# Patient Record
Sex: Female | Born: 1950 | Race: White | Hispanic: No | Marital: Married | Smoking: Former smoker
Health system: Southern US, Community
[De-identification: ages and names within clinical notes are randomized; demographics above are authoritative.]

## PROBLEM LIST (undated history)

## (undated) DIAGNOSIS — R0600 Dyspnea, unspecified: Secondary | ICD-10-CM

## (undated) DIAGNOSIS — I442 Atrioventricular block, complete: Secondary | ICD-10-CM

## (undated) DIAGNOSIS — J449 Chronic obstructive pulmonary disease, unspecified: Secondary | ICD-10-CM

## (undated) DIAGNOSIS — D500 Iron deficiency anemia secondary to blood loss (chronic): Secondary | ICD-10-CM

## (undated) DIAGNOSIS — R001 Bradycardia, unspecified: Secondary | ICD-10-CM

## (undated) DIAGNOSIS — M1990 Unspecified osteoarthritis, unspecified site: Secondary | ICD-10-CM

## (undated) DIAGNOSIS — I10 Essential (primary) hypertension: Secondary | ICD-10-CM

## (undated) DIAGNOSIS — E785 Hyperlipidemia, unspecified: Secondary | ICD-10-CM

## (undated) DIAGNOSIS — E119 Type 2 diabetes mellitus without complications: Secondary | ICD-10-CM

## (undated) HISTORY — PX: INSERT / REPLACE / REMOVE PACEMAKER: SUR710

## (undated) HISTORY — PX: CHOLECYSTECTOMY: SHX55

## (undated) HISTORY — PX: ESSURE TUBAL LIGATION: SUR464

---
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
## 2011-02-18 ENCOUNTER — Ambulatory Visit: Payer: Self-pay

## 2015-11-24 ENCOUNTER — Other Ambulatory Visit: Payer: Self-pay | Admitting: Internal Medicine

## 2015-11-24 DIAGNOSIS — R0602 Shortness of breath: Secondary | ICD-10-CM

## 2015-11-29 ENCOUNTER — Ambulatory Visit
Admission: RE | Admit: 2015-11-29 | Discharge: 2015-11-29 | Disposition: A | Discharge status: Home / Self Care | Payer: Medicare Other | Source: Ambulatory Visit | Attending: Family Medicine | Admitting: Family Medicine

## 2015-11-29 DIAGNOSIS — I071 Rheumatic tricuspid insufficiency: Secondary | ICD-10-CM | POA: Diagnosis not present

## 2015-11-29 DIAGNOSIS — I340 Nonrheumatic mitral (valve) insufficiency: Secondary | ICD-10-CM | POA: Insufficient documentation

## 2015-11-29 DIAGNOSIS — R0602 Shortness of breath: Secondary | ICD-10-CM

## 2015-11-29 NOTE — Progress Notes (Signed)
*  PRELIMINARY RESULTS* Echocardiogram 2D Echocardiogram has been performed.  Sherrie Sport 11/29/2015, 10:38 AM

## 2015-12-20 DIAGNOSIS — I442 Atrioventricular block, complete: Secondary | ICD-10-CM | POA: Diagnosis present

## 2015-12-20 DIAGNOSIS — E119 Type 2 diabetes mellitus without complications: Secondary | ICD-10-CM | POA: Insufficient documentation

## 2015-12-20 DIAGNOSIS — I10 Essential (primary) hypertension: Secondary | ICD-10-CM | POA: Insufficient documentation

## 2015-12-20 DIAGNOSIS — R001 Bradycardia, unspecified: Secondary | ICD-10-CM | POA: Insufficient documentation

## 2015-12-20 DIAGNOSIS — Z794 Long term (current) use of insulin: Secondary | ICD-10-CM | POA: Insufficient documentation

## 2015-12-21 ENCOUNTER — Encounter: Payer: Self-pay | Admitting: *Deleted

## 2015-12-21 ENCOUNTER — Encounter
Admission: RE | Admit: 2015-12-21 | Discharge: 2015-12-21 | Disposition: A | Discharge status: Home / Self Care | Payer: Medicare Other | Source: Ambulatory Visit | Attending: Cardiology | Admitting: Cardiology

## 2015-12-21 HISTORY — DX: Atrioventricular block, complete: I44.2

## 2015-12-21 HISTORY — DX: Essential (primary) hypertension: I10

## 2015-12-21 HISTORY — DX: Type 2 diabetes mellitus without complications: E11.9

## 2015-12-21 HISTORY — DX: Bradycardia, unspecified: R00.1

## 2015-12-21 HISTORY — DX: Unspecified osteoarthritis, unspecified site: M19.90

## 2015-12-21 HISTORY — DX: Hyperlipidemia, unspecified: E78.5

## 2015-12-21 LAB — SURGICAL PCR SCREEN
MRSA, PCR: NEGATIVE
STAPHYLOCOCCUS AUREUS: NEGATIVE

## 2015-12-21 NOTE — Pre-Procedure Instructions (Signed)
The patient was given instructions from Dr. Saralyn Pilar about taking morning insulin the day of surgery. Discussion with Dr. Andree Elk regarding this and ordered that the patient is to take 15 units of her lantus insulin on the morning of surgery.

## 2015-12-21 NOTE — Patient Instructions (Addendum)
  Your procedure is scheduled on: December 22, 2015 Report to Same Day Surgery 2nd floor medical mall at 8 AM. [facility] elevator on left to 2nd floor.  Check in with surgery information desk.)   Remember: Instructions that are not followed completely may result in serious medical risk, up to and including death, or upon the discretion of your surgeon and anesthesiologist your surgery may need to be rescheduled.    _x___ 1. Do not eat food or drink liquids after midnight. No gum chewing or hard candies.     __x__ 2. No Alcohol for 24 hours before or after surgery.   __x__3. No Smoking for 24 prior to surgery.   ____  4. Bring all medications with you on the day of surgery if instructed.    __x__ 5. Notify your doctor if there is any change in your medical condition     (cold, fever, infections).     Do not wear jewelry, make-up, hairpins, clips or nail polish.  Do not wear lotions, powders, or perfumes. You may wear deodorant.  Do not shave 48 hours prior to surgery. Men may shave face and neck.  Do not bring valuables to the hospital.    [facility] is not responsible for any belongings or valuables.               Contacts, dentures or bridgework may not be worn into surgery.  Leave your suitcase in the car. After surgery it may be brought to your room.  For patients admitted to the hospital, discharge time is determined by your treatment team.   Patients discharged the day of surgery will not be allowed to drive home.  You will need someone to drive you home and stay with you the night of your procedure.    Please read over the following fact sheets that you were given:   [facility] Preparing for Surgery and or MRSA Information   _x___ Take these medicines the morning of surgery with A SIP OF WATER:    1.  LOSARTAN  2.  3.  4.  5.  6.  ____Fleets enema or Magnesium Citrate as directed.   _X___ Use CHG Soap or sage wipes as directed on instruction sheet    ____ Use inhalers on the day of surgery and bring to hospital day of surgery  __X__ Stop [facility] today.    __X__ Take 15 units of Lantus the morning of surgery.  ____ Stop Aspirin, Coumadin, Pllavix ,Eliquis, Effient, or Pradaxa  X__ Stop Anti-inflammatories such as Advil, Aleve, Ibuprofen, Motrin, Naproxen,          Naprosyn, Goodies powders or aspirin products. Ok to take Tylenol.   ____ Stop supplements until after surgery.    ____ Bring C-Pap to the hospital.

## 2015-12-21 NOTE — Pre-Procedure Instructions (Signed)
Dr. Saralyn Pilar office called upon patient arrival to PAT regarding no orders for surgery. Awaiting orders.

## 2015-12-22 ENCOUNTER — Encounter
Admission: AD | Disposition: A | Discharge status: Home Health | Payer: Self-pay | Source: Ambulatory Visit | Attending: Cardiology

## 2015-12-22 ENCOUNTER — Observation Stay: Payer: Medicare Other

## 2015-12-22 ENCOUNTER — Ambulatory Visit: Payer: Medicare Other

## 2015-12-22 ENCOUNTER — Ambulatory Visit: Payer: Medicare Other | Admitting: Anesthesiology

## 2015-12-22 ENCOUNTER — Inpatient Hospital Stay
Admission: AD | Admit: 2015-12-22 | Discharge: 2015-12-24 | DRG: 189 | Disposition: A | Discharge status: Home Health | Payer: Medicare Other | Source: Ambulatory Visit | Attending: Internal Medicine | Admitting: Internal Medicine

## 2015-12-22 ENCOUNTER — Encounter: Payer: Self-pay | Admitting: *Deleted

## 2015-12-22 DIAGNOSIS — Z8249 Family history of ischemic heart disease and other diseases of the circulatory system: Secondary | ICD-10-CM

## 2015-12-22 DIAGNOSIS — I442 Atrioventricular block, complete: Secondary | ICD-10-CM | POA: Diagnosis present

## 2015-12-22 DIAGNOSIS — R609 Edema, unspecified: Secondary | ICD-10-CM

## 2015-12-22 DIAGNOSIS — E119 Type 2 diabetes mellitus without complications: Secondary | ICD-10-CM | POA: Diagnosis present

## 2015-12-22 DIAGNOSIS — Z833 Family history of diabetes mellitus: Secondary | ICD-10-CM

## 2015-12-22 DIAGNOSIS — I110 Hypertensive heart disease with heart failure: Secondary | ICD-10-CM | POA: Diagnosis present

## 2015-12-22 DIAGNOSIS — E785 Hyperlipidemia, unspecified: Secondary | ICD-10-CM | POA: Diagnosis present

## 2015-12-22 DIAGNOSIS — J9601 Acute respiratory failure with hypoxia: Principal | ICD-10-CM | POA: Diagnosis present

## 2015-12-22 DIAGNOSIS — R0602 Shortness of breath: Secondary | ICD-10-CM

## 2015-12-22 DIAGNOSIS — R001 Bradycardia, unspecified: Secondary | ICD-10-CM | POA: Diagnosis present

## 2015-12-22 DIAGNOSIS — J441 Chronic obstructive pulmonary disease with (acute) exacerbation: Secondary | ICD-10-CM | POA: Diagnosis not present

## 2015-12-22 DIAGNOSIS — F17210 Nicotine dependence, cigarettes, uncomplicated: Secondary | ICD-10-CM | POA: Diagnosis present

## 2015-12-22 DIAGNOSIS — I5031 Acute diastolic (congestive) heart failure: Secondary | ICD-10-CM | POA: Diagnosis present

## 2015-12-22 DIAGNOSIS — Z9049 Acquired absence of other specified parts of digestive tract: Secondary | ICD-10-CM

## 2015-12-22 DIAGNOSIS — Z794 Long term (current) use of insulin: Secondary | ICD-10-CM

## 2015-12-22 DIAGNOSIS — E669 Obesity, unspecified: Secondary | ICD-10-CM | POA: Diagnosis present

## 2015-12-22 DIAGNOSIS — Z6841 Body Mass Index (BMI) 40.0 and over, adult: Secondary | ICD-10-CM

## 2015-12-22 DIAGNOSIS — Z950 Presence of cardiac pacemaker: Secondary | ICD-10-CM

## 2015-12-22 HISTORY — PX: PACEMAKER INSERTION: SHX728

## 2015-12-22 LAB — GLUCOSE, CAPILLARY
GLUCOSE-CAPILLARY: 123 mg/dL — AB (ref 65–99)
GLUCOSE-CAPILLARY: 118 mg/dL — AB (ref 65–99)
GLUCOSE-CAPILLARY: 234 mg/dL — AB (ref 65–99)
Glucose-Capillary: 295 mg/dL — ABNORMAL HIGH (ref 65–99)
Glucose-Capillary: 227 mg/dL — ABNORMAL HIGH (ref 65–99)

## 2015-12-22 SURGERY — INSERTION, CARDIAC PACEMAKER
Anesthesia: Monitor Anesthesia Care

## 2015-12-22 MED ORDER — IPRATROPIUM-ALBUTEROL 0.5-2.5 (3) MG/3ML IN SOLN: 3.0000 mL | RESPIRATORY_TRACT | Status: DC

## 2015-12-22 MED ORDER — INSULIN GLARGINE 100 UNIT/ML ~~LOC~~ SOLN
50.0000 [IU] | Freq: Every day | SUBCUTANEOUS | Status: DC
  Administered 2015-12-23 – 2015-12-24 (×2): 50 [IU] via SUBCUTANEOUS
  Filled 2015-12-22 (×2): qty 0.5

## 2015-12-22 MED ORDER — ONDANSETRON HCL 4 MG/2ML IJ SOLN: 4.0000 mg | Freq: Four times a day (QID) | INTRAMUSCULAR | Status: DC | PRN

## 2015-12-22 MED ORDER — FENTANYL CITRATE (PF) 100 MCG/2ML IJ SOLN: 25.0000 ug | INTRAMUSCULAR | Status: DC | PRN

## 2015-12-22 MED ORDER — FUROSEMIDE 20 MG PO TABS: 20.0000 mg | ORAL_TABLET | Freq: Every day | ORAL | Status: DC

## 2015-12-22 MED ORDER — CEFAZOLIN IN D5W 1 GM/50ML IV SOLN
1.0000 g | Freq: Four times a day (QID) | INTRAVENOUS | Status: AC
  Administered 2015-12-22 – 2015-12-23 (×3): 1 g via INTRAVENOUS
  Filled 2015-12-22 (×3): qty 50

## 2015-12-22 MED ORDER — ACETAMINOPHEN 325 MG PO TABS
325.0000 mg | ORAL_TABLET | ORAL | Status: DC | PRN
  Administered 2015-12-22: 650 mg via ORAL
  Administered 2015-12-23: 325 mg via ORAL
  Administered 2015-12-23: 650 mg via ORAL
  Filled 2015-12-22 (×3): qty 2

## 2015-12-22 MED ORDER — SODIUM CHLORIDE 0.9 % IV SOLN
INTRAVENOUS | Status: DC
  Administered 2015-12-22 (×2): via INTRAVENOUS

## 2015-12-22 MED ORDER — ALPRAZOLAM 0.25 MG PO TABS
0.2500 mg | ORAL_TABLET | Freq: Three times a day (TID) | ORAL | Status: DC | PRN
  Administered 2015-12-22: 0.25 mg via ORAL
  Filled 2015-12-22: qty 1

## 2015-12-22 MED ORDER — METHYLPREDNISOLONE SODIUM SUCC 125 MG IJ SOLR
40.0000 mg | Freq: Two times a day (BID) | INTRAMUSCULAR | Status: DC
  Administered 2015-12-23: 40 mg via INTRAVENOUS
  Filled 2015-12-22: qty 2

## 2015-12-22 MED ORDER — NICOTINE 21 MG/24HR TD PT24
21.0000 mg | MEDICATED_PATCH | Freq: Every day | TRANSDERMAL | Status: DC
  Administered 2015-12-22 – 2015-12-24 (×3): 21 mg via TRANSDERMAL
  Filled 2015-12-22 (×3): qty 1

## 2015-12-22 MED ORDER — METOPROLOL TARTRATE 5 MG/5ML IV SOLN
2.0000 mg | Freq: Once | INTRAVENOUS | Status: AC
  Administered 2015-12-22: 2 mg via INTRAVENOUS

## 2015-12-22 MED ORDER — LOSARTAN POTASSIUM 50 MG PO TABS
100.0000 mg | ORAL_TABLET | Freq: Every day | ORAL | Status: DC
  Administered 2015-12-23 – 2015-12-24 (×2): 100 mg via ORAL
  Filled 2015-12-22 (×2): qty 2

## 2015-12-22 MED ORDER — NITROGLYCERIN 2 % TD OINT
1.0000 [in_us] | TOPICAL_OINTMENT | Freq: Four times a day (QID) | TRANSDERMAL | Status: DC
  Administered 2015-12-22 – 2015-12-24 (×7): 1 [in_us] via TOPICAL
  Filled 2015-12-22 (×7): qty 1

## 2015-12-22 MED ORDER — LIDOCAINE 1 % OPTIME INJ - NO CHARGE
INTRAMUSCULAR | Status: DC | PRN
  Administered 2015-12-22: 30 mL

## 2015-12-22 MED ORDER — IPRATROPIUM-ALBUTEROL 0.5-2.5 (3) MG/3ML IN SOLN
3.0000 mL | Freq: Once | RESPIRATORY_TRACT | Status: AC
  Administered 2015-12-22: 3 mL via RESPIRATORY_TRACT

## 2015-12-22 MED ORDER — HYDRALAZINE HCL 20 MG/ML IJ SOLN
INTRAMUSCULAR | Status: AC
  Filled 2015-12-22: qty 1

## 2015-12-22 MED ORDER — SODIUM CHLORIDE 0.9 % IR SOLN
Status: DC | PRN
  Administered 2015-12-22: 2 mL

## 2015-12-22 MED ORDER — IPRATROPIUM-ALBUTEROL 0.5-2.5 (3) MG/3ML IN SOLN
3.0000 mL | RESPIRATORY_TRACT | Status: DC | PRN
Start: 2015-12-22 — End: 2015-12-22

## 2015-12-22 MED ORDER — INSULIN GLARGINE 100 UNIT/ML ~~LOC~~ SOLN
25.0000 [IU] | Freq: Every day | SUBCUTANEOUS | Status: DC
Start: 2015-12-23 — End: 2015-12-22
  Filled 2015-12-22: qty 0.25

## 2015-12-22 MED ORDER — INSULIN ASPART 100 UNIT/ML ~~LOC~~ SOLN
0.0000 [IU] | Freq: Three times a day (TID) | SUBCUTANEOUS | Status: DC
  Administered 2015-12-22: 11 [IU] via SUBCUTANEOUS
  Administered 2015-12-23: 20 [IU] via SUBCUTANEOUS
  Filled 2015-12-22: qty 26
  Filled 2015-12-22: qty 4
  Filled 2015-12-22: qty 11
  Filled 2015-12-22: qty 20

## 2015-12-22 MED ORDER — FUROSEMIDE 10 MG/ML IJ SOLN
INTRAMUSCULAR | Status: AC
  Filled 2015-12-22: qty 2

## 2015-12-22 MED ORDER — SODIUM CHLORIDE 0.9 % IV SOLN
INTRAVENOUS | Status: DC
  Administered 2015-12-22: 09:00:00 via INTRAVENOUS

## 2015-12-22 MED ORDER — FUROSEMIDE 10 MG/ML IJ SOLN
20.0000 mg | Freq: Once | INTRAMUSCULAR | Status: AC
  Administered 2015-12-22: 20 mg via INTRAVENOUS

## 2015-12-22 MED ORDER — HYDRALAZINE HCL 20 MG/ML IJ SOLN
INTRAMUSCULAR | Status: DC | PRN
  Administered 2015-12-22: 10 mg via INTRAVENOUS

## 2015-12-22 MED ORDER — FAMOTIDINE 20 MG PO TABS
ORAL_TABLET | ORAL | Status: AC
  Administered 2015-12-22: 20 mg via ORAL
  Filled 2015-12-22: qty 1

## 2015-12-22 MED ORDER — LIDOCAINE HCL (PF) 1 % IJ SOLN
INTRAMUSCULAR | Status: AC
  Filled 2015-12-22: qty 30

## 2015-12-22 MED ORDER — IPRATROPIUM-ALBUTEROL 0.5-2.5 (3) MG/3ML IN SOLN
RESPIRATORY_TRACT | Status: AC
  Filled 2015-12-22: qty 3

## 2015-12-22 MED ORDER — METOPROLOL TARTRATE 5 MG/5ML IV SOLN
3.0000 mg | Freq: Once | INTRAVENOUS | Status: AC
  Administered 2015-12-22: 3 mg via INTRAVENOUS

## 2015-12-22 MED ORDER — GENTAMICIN SULFATE 40 MG/ML IJ SOLN
INTRAMUSCULAR | Status: AC
  Filled 2015-12-22: qty 2

## 2015-12-22 MED ORDER — PROPOFOL 500 MG/50ML IV EMUL
INTRAVENOUS | Status: DC | PRN
  Administered 2015-12-22: 30 ug/kg/min via INTRAVENOUS

## 2015-12-22 MED ORDER — IPRATROPIUM-ALBUTEROL 0.5-2.5 (3) MG/3ML IN SOLN
3.0000 mL | Freq: Four times a day (QID) | RESPIRATORY_TRACT | Status: DC
  Administered 2015-12-22 – 2015-12-23 (×3): 3 mL via RESPIRATORY_TRACT
  Filled 2015-12-22 (×3): qty 3

## 2015-12-22 MED ORDER — HYDRALAZINE HCL 20 MG/ML IJ SOLN
10.0000 mg | Freq: Once | INTRAMUSCULAR | Status: AC
  Administered 2015-12-22: 10 mg via INTRAVENOUS

## 2015-12-22 MED ORDER — METOPROLOL TARTRATE 5 MG/5ML IV SOLN
INTRAVENOUS | Status: AC
  Filled 2015-12-22: qty 5

## 2015-12-22 MED ORDER — IPRATROPIUM-ALBUTEROL 0.5-2.5 (3) MG/3ML IN SOLN
3.0000 mL | RESPIRATORY_TRACT | Status: DC | PRN
  Administered 2015-12-22 – 2015-12-23 (×2): 3 mL via RESPIRATORY_TRACT
  Filled 2015-12-22 (×3): qty 3

## 2015-12-22 MED ORDER — METFORMIN HCL 500 MG PO TABS: 1000.0000 mg | ORAL_TABLET | Freq: Two times a day (BID) | ORAL | Status: DC

## 2015-12-22 MED ORDER — FENTANYL CITRATE (PF) 100 MCG/2ML IJ SOLN
INTRAMUSCULAR | Status: DC | PRN
  Administered 2015-12-22 (×2): 25 ug via INTRAVENOUS

## 2015-12-22 MED ORDER — ROSUVASTATIN CALCIUM 10 MG PO TABS
10.0000 mg | ORAL_TABLET | Freq: Every day | ORAL | Status: DC
  Administered 2015-12-22 – 2015-12-23 (×2): 10 mg via ORAL
  Filled 2015-12-22 (×2): qty 1

## 2015-12-22 MED ORDER — METHYLPREDNISOLONE SODIUM SUCC 125 MG IJ SOLR
INTRAMUSCULAR | Status: AC
  Filled 2015-12-22: qty 2

## 2015-12-22 MED ORDER — METHYLPREDNISOLONE SODIUM SUCC 125 MG IJ SOLR: 40.0000 mg | INTRAMUSCULAR | Status: DC

## 2015-12-22 MED ORDER — PROPOFOL 10 MG/ML IV BOLUS
INTRAVENOUS | Status: DC | PRN
  Administered 2015-12-22: 20 mg via INTRAVENOUS
  Administered 2015-12-22: 10 mg via INTRAVENOUS

## 2015-12-22 MED ORDER — INSULIN GLARGINE 100 UNIT/ML ~~LOC~~ SOLN
50.0000 [IU] | Freq: Every day | SUBCUTANEOUS | Status: DC
  Filled 2015-12-22 (×2): qty 0.5

## 2015-12-22 MED ORDER — ONDANSETRON HCL 4 MG/2ML IJ SOLN: 4.0000 mg | Freq: Once | INTRAMUSCULAR | Status: DC | PRN

## 2015-12-22 MED ORDER — MIDAZOLAM HCL 2 MG/2ML IJ SOLN
INTRAMUSCULAR | Status: DC | PRN
  Administered 2015-12-22: 2 mg via INTRAVENOUS

## 2015-12-22 MED ORDER — INSULIN ASPART 100 UNIT/ML ~~LOC~~ SOLN
0.0000 [IU] | Freq: Every day | SUBCUTANEOUS | Status: DC
  Administered 2015-12-22: 2 [IU] via SUBCUTANEOUS
  Filled 2015-12-22: qty 2

## 2015-12-22 MED ORDER — METHYLPREDNISOLONE SODIUM SUCC 125 MG IJ SOLR
125.0000 mg | Freq: Once | INTRAMUSCULAR | Status: AC
Start: 2015-12-22 — End: 2015-12-22
  Administered 2015-12-22: 125 mg via INTRAVENOUS

## 2015-12-22 MED ORDER — FAMOTIDINE 20 MG PO TABS
20.0000 mg | ORAL_TABLET | Freq: Once | ORAL | Status: AC
  Administered 2015-12-22: 20 mg via ORAL

## 2015-12-22 SURGICAL SUPPLY — 37 items
BAG DECANTER FOR FLEXI CONT (MISCELLANEOUS) ×3 IMPLANT
BRUSH SCRUB 4% CHG (MISCELLANEOUS) ×3 IMPLANT
CABLE SURG 12 DISP A/V CHANNEL (MISCELLANEOUS) ×3 IMPLANT
CANISTER SUCT 1200ML W/VALVE (MISCELLANEOUS) ×3 IMPLANT
CHLORAPREP W/TINT 26ML (MISCELLANEOUS) ×3 IMPLANT
COVER LIGHT HANDLE STERIS (MISCELLANEOUS) ×6 IMPLANT
COVER MAYO STAND STRL (DRAPES) ×3 IMPLANT
DEVICE DISSECT PLASMABLAD 3.0S (MISCELLANEOUS) IMPLANT
DRAPE C-ARM XRAY 36X54 (DRAPES) ×3 IMPLANT
DRESSING TELFA 4X3 1S ST N-ADH (GAUZE/BANDAGES/DRESSINGS) ×3 IMPLANT
DRSG TEGADERM 4X4.75 (GAUZE/BANDAGES/DRESSINGS) ×3 IMPLANT
ELECT REM PT RETURN 9FT ADLT (ELECTROSURGICAL) ×3
ELECTRODE REM PT RTRN 9FT ADLT (ELECTROSURGICAL) ×1 IMPLANT
GLOVE BIO SURGEON STRL SZ7.5 (GLOVE) ×3 IMPLANT
GLOVE BIO SURGEON STRL SZ8 (GLOVE) ×3 IMPLANT
GOWN STRL REUS W/ TWL LRG LVL3 (GOWN DISPOSABLE) ×1 IMPLANT
GOWN STRL REUS W/ TWL XL LVL3 (GOWN DISPOSABLE) ×1 IMPLANT
GOWN STRL REUS W/TWL LRG LVL3 (GOWN DISPOSABLE) ×2
GOWN STRL REUS W/TWL XL LVL3 (GOWN DISPOSABLE) ×2
IMMOBILIZER SHDR MD LX WHT (SOFTGOODS) IMPLANT
IMMOBILIZER SHDR XL LX WHT (SOFTGOODS) ×3 IMPLANT
INTRO PACEMAKR LEAD 9FR 13CM (INTRODUCER) ×3
INTRO PACEMKR SHEATH II 7FR (MISCELLANEOUS) ×3
INTRODUCER PACEMKR LD 9FR 13CM (INTRODUCER) ×1 IMPLANT
INTRODUCER PACEMKR SHTH II 7FR (MISCELLANEOUS) ×1 IMPLANT
IV NS 500ML (IV SOLUTION) ×2
IV NS 500ML BAXH (IV SOLUTION) ×1 IMPLANT
KIT RM TURNOVER STRD PROC AR (KITS) ×3 IMPLANT
LABEL OR SOLS (LABEL) ×3 IMPLANT
LEAD CAPSURE NOVUS 5076-52CM (Lead) ×3 IMPLANT
MARKER SKIN DUAL TIP RULER LAB (MISCELLANEOUS) ×3 IMPLANT
PACEMAKER LEAD ATRL (Lead) ×3 IMPLANT
PACK PACE INSERTION (MISCELLANEOUS) ×3 IMPLANT
PAD ONESTEP ZOLL R SERIES ADT (MISCELLANEOUS) ×3 IMPLANT
PLASMABLADE 3.0S (MISCELLANEOUS)
PPM ADVISA MRI DR A2DR01 (Pacemaker) ×3 IMPLANT
SUT SILK 0 SH 30 (SUTURE) ×9 IMPLANT

## 2015-12-22 NOTE — Transfer of Care (Signed)
Immediate Anesthesia Transfer of Care Note  Patient: Lorraine Boone  Procedure(s) Performed: Procedure(s): INSERTION PACEMAKER (N/A)  Patient Location: PACU  Anesthesia Type:MAC  Level of Consciousness: awake and alert   Airway & Oxygen Therapy: Patient Spontanous Breathing and Patient connected to nasal cannula oxygen  Post-op Assessment: Report given to RN and Post -op Vital signs reviewed and stable  Post vital signs: Reviewed and stable  Last Vitals:  Vitals:   12/22/15 0756  BP: (!) 238/37  Pulse: (!) 50  Resp: 18  Temp: (!) 36 C    Last Pain:  Vitals:   12/22/15 0756  TempSrc: Tympanic  PainSc: 2          Complications: No apparent anesthesia complications

## 2015-12-22 NOTE — Anesthesia Preprocedure Evaluation (Addendum)
Anesthesia Evaluation  Patient identified by MRN, date of birth, ID band Patient awake    Reviewed: Allergy & Precautions, NPO status , Patient's Chart, lab work & pertinent test results  Airway Mallampati: II  TM Distance: <3 FB     Dental  (+) Loose, Missing, Poor Dentition   Pulmonary shortness of breath and with exertion, Current Smoker,     + wheezing      Cardiovascular hypertension, Pt. on medications + dysrhythmias  Rate:Bradycardia     Neuro/Psych negative neurological ROS  negative psych ROS   GI/Hepatic negative GI ROS, Neg liver ROS,   Endo/Other  diabetes, Well Controlled, Type 2, Oral Hypoglycemic Agents  Renal/GU negative Renal ROS  negative genitourinary   Musculoskeletal  (+) Arthritis , Osteoarthritis,    Abdominal Normal abdominal exam  (+)   Peds negative pediatric ROS (+)  Hematology negative hematology ROS (+)   Anesthesia Other Findings Past Medical History: No date: Arthritis No date: Bradycardia No date: Diabetes mellitus type 2, uncomplicated (HCC) No date: Hyperlipidemia No date: Hypertension No date: Intermittent complete heart block (HCC) SOB with minimal movement  Reproductive/Obstetrics                           Anesthesia Physical Anesthesia Plan  ASA: III  Anesthesia Plan: MAC   Post-op Pain Management:    Induction: Intravenous  Airway Management Planned: Nasal Cannula  Additional Equipment:   Intra-op Plan:   Post-operative Plan:   Informed Consent: I have reviewed the patients History and Physical, chart, labs and discussed the procedure including the risks, benefits and alternatives for the proposed anesthesia with the patient or authorized representative who has indicated his/her understanding and acceptance.   Dental advisory given  Plan Discussed with: CRNA and Surgeon  Anesthesia Plan Comments:         Anesthesia Quick  Evaluation

## 2015-12-22 NOTE — Plan of Care (Signed)
Problem: Safety: Goal: Ability to remain free from injury will improve Outcome: Progressing Fall precautions in place  Problem: Pain Managment: Goal: General experience of comfort will improve Outcome: Progressing Prn medications  Problem: Physical Regulation: Goal: Will remain free from infection Outcome: Progressing IV antibiotics

## 2015-12-22 NOTE — Op Note (Signed)
[facility]   12/22/2015                     10:18 AM  PATIENT:  Lorraine Boone    PRE-OPERATIVE DIAGNOSIS:  bradycardia  POST-OPERATIVE DIAGNOSIS:  Same  PROCEDURE:  INSERTION PACEMAKER  SURGEON:  Isaias Cowman, MD    ANESTHESIA:     PREOPERATIVE INDICATIONS:  Lorraine Boone is a  65 y.o. female with a diagnosis of bradycardia who failed conservative measures and elected for surgical management.    The risks benefits and alternatives were discussed with the patient preoperatively including but not limited to the risks of infection, bleeding, cardiopulmonary complications, the need for revision surgery, among others, and the patient was willing to proceed.   OPERATIVE PROCEDURE: The patient was brought to the operating room the fasting state. Left pectoral region was prepped and draped in usual sterile manner. Anesthesia was obtained 1% lidocaine locally. A 6 cm incision was performed a left pectoral region. Access was obtained to left subclavian vein by fine needle aspiration. Compatible leads were positioned to right ventricular apical septum and right atrial appendage under fluoroscopic guidance. After proper thresholds were obtained the leads were sutured in place. The leads were connected to a MRI compatible dual-chamber rate responsive pacemaker generator (Medtronic [id number]). The pocket was irrigated gentamicin solution. The pacemaker generator was positioned into the pocket and the pocket was closed with 2-0 and 4-0 Vicryl, respectively. Steri-Strips and a pressure dressing were applied.

## 2015-12-22 NOTE — Progress Notes (Signed)
O2 sat on 2LO2 is 94 %, family hovering over pt in room.  Call in to Dr. Saralyn Pilar.  New orders received.

## 2015-12-22 NOTE — H&P (Signed)
  H and P reviewed, no change.

## 2015-12-22 NOTE — Progress Notes (Signed)
Pt complaining of SOB, neb treatment called for.

## 2015-12-22 NOTE — Significant Event (Signed)
Pt re-evaluated @ request of PACU RN. She is now very comfortable off BiPAP with no wheezing. SpO2 97% on 2 lpm [place]. I have ordered that she may go to telemetry bed and have discontinued BiPAP. Will almost certainly be ready for discharge to home 12/01 and I have arranged for follow up in Pulmonary Clinic with Dr Clydell Hakim, MD PCCM service Mobile [phone] Pager [phone] 12/22/2015

## 2015-12-22 NOTE — Anesthesia Postprocedure Evaluation (Signed)
Anesthesia Post Note  Patient: Lorraine Boone  Procedure(s) Performed: Procedure(s) (LRB): INSERTION PACEMAKER (N/A)  Patient location during evaluation: PACU Level of consciousness: awake and alert and oriented Pain management: pain level controlled Respiratory status: patient connected to nasal cannula oxygen and respiratory function unstable Cardiovascular status: blood pressure returned to baseline Anesthetic complications: no Comments: Preoperatively, the patient had SOB with exertion.  This am the patient was quite out of breath when moving from the stretcher to the OR bed with sats dropping to 91% initially upon transfer.  The patient tolerated the procedure well with gentle sedation, but had increasing work of breathing in PACU requiring increased O2..  The patient was given rounds of 2 duonebs, solumedrol 125 mg and Lasix 20 mg ..  Bipap was also added to decrease the work of breathing. At 1210 pm , the patient is more restful with decreased work of breathing and  urine output of over 700 ml.  Suspect this could be a COPD flare coupled with return of a normalized HR and BP    Last Vitals:  Vitals:   12/22/15 1138 12/22/15 1207  BP: (!) 197/87 (!) 200/76  Pulse: 83 81  Resp: 20 11  Temp:      Last Pain:  Vitals:   12/22/15 0756  TempSrc: Tympanic  PainSc: 2                  Lorraine Boone

## 2015-12-22 NOTE — Consult Note (Signed)
[place] [facility] [place] NAME: Lorraine Boone    MR#:  [id number]  DATE OF BIRTH:  [birth year]  DATE OF ADMISSION:  12/22/2015  PRIMARY CARE PHYSICIAN: Casilda Carls   REQUESTING/REFERRING PHYSICIAN: DR Paraschos  CHIEF COMPLAINT:  No chief complaint on file.   HISTORY OF PRESENT ILLNESS:  Lorraine Boone  is a 65 y.o. female with a known history of Diabetes, bradycardia, hyperlipidemia, hypertension, complete heart block, who presents to the hospital for permanent pacemaker placement. In the recovery, she was noted to be more short of breath, wheezing, coughing, and required BiPAP administration. She was given 2 DuoNeb nebs, Solu Medrol, Lasix intravenously, weaned down to oxygen via nasal cannula as and feels a little bit more comfortable today. She was admitted by Dr. Saralyn Pilar. Hospitalist services were contacted for consultation for diabetes management.  PAST MEDICAL HISTORY:   Past Medical History:  Diagnosis Date  . Arthritis   . Bradycardia   . Diabetes mellitus type 2, uncomplicated ([place])   . Hyperlipidemia   . Hypertension   . Intermittent complete heart block ([place])     PAST SURGICAL HISTOIRY:   Past Surgical History:  Procedure Laterality Date  . CHOLECYSTECTOMY    . ESSURE TUBAL LIGATION      SOCIAL HISTORY:   Social History  Substance Use Topics  . Smoking status: Current Every Day Smoker    Packs/day: 0.25    Years: 35.00    Types: Cigarettes  . Smokeless tobacco: Never Used  . Alcohol use No    FAMILY HISTORY:   Family History  Problem Relation Age of Onset  . Diabetes Mother   . Heart disease Mother     DRUG ALLERGIES:  No Known Allergies  REVIEW OF SYSTEMS:  CONSTITUTIONAL: No fever, Admits of fatigue or weakness. Admits of significant weight gain of at least 10 pounds over the past 1-2 weeks. Admits of some dizziness while she is rolling in the bed, feeling woozy EYES: No blurred or double  vision.  EARS, NOSE, AND THROAT: No tinnitus or ear pain.  RESPIRATORY: Admits of cough, some phlegm production over the past one week, yellowish in color,  shortness of breath, wheezingintermittent in the recovery room, but no hemoptysis.  CARDIOVASCULAR: No chest pain, orthopnea, admits of lower extremity and pedal edema, worsening over a period of 2 weeks.  GASTROINTESTINAL: No nausea, vomiting, diarrhea or abdominal pain. Admits of constipation GENITOURINARY: No dysuria, hematuria. Admits of decreased frequency of urination ENDOCRINE: No polyuria, nocturia,  HEMATOLOGY: No anemia, easy bruising or bleeding SKIN: No rash or lesion. MUSCULOSKELETAL: No joint pain or arthritis.   NEUROLOGIC: No tingling, numbness, weakness.  PSYCHIATRY: No anxiety or depression.   MEDICATIONS AT HOME:   Prior to Admission medications   Medication Sig Start Date End Date Taking? Authorizing Provider  acetaminophen (TYLENOL) 325 MG tablet Take 650 mg by mouth every 6 (six) hours as needed.   Yes Historical Provider, MD  ergocalciferol (VITAMIN D2) 50000 units capsule Take 50,000 Units by mouth once a week.   Yes Historical Provider, MD  furosemide (LASIX) 20 MG tablet Take 20 mg by mouth daily.   Yes Historical Provider, MD  insulin glargine (LANTUS) 100 UNIT/ML injection Inject 50 Units into the skin daily.   Yes Historical Provider, MD  losartan (COZAAR) 100 MG tablet Take 100 mg by mouth daily.   Yes Historical Provider, MD  rosuvastatin (CRESTOR) 10 MG tablet Take 10 mg by mouth  daily.   Yes Historical Provider, MD  Saxagliptin-Metformin (KOMBIGLYZE XR) 2.05-998 MG TB24 Take 1 tablet by mouth 2 (two) times daily.   Yes Historical Provider, MD      VITAL SIGNS:  Blood pressure (!) 162/91, pulse 100, temperature 98.1 F (36.7 C), resp. rate 16, height 5\' 3"  (1.6 m), weight 107.5 kg (237 lb), SpO2 95 %.  PHYSICAL EXAMINATION:  GENERAL:  65 y.o.-year-old Obese patient lying in the bed with no acute  distress. Smells of tobacco from the distance EYES: Pupils equal, round, reactive to light and accommodation. No scleral icterus. Extraocular muscles intact.  HEENT: Head atraumatic, normocephalic. Oropharynx and nasopharynx clear.  NECK:  Supple, no jugular venous distention. No thyroid enlargement, no tenderness.  LUNGS: Diminished breath sounds bilaterally, no wheezing, rales,rhonchi or crepitation. Intermittent use of accessory muscles of respiration.  CARDIOVASCULAR: S1, S2 normal, rhythm is regular. No murmurs, rubs, or gallops.  ABDOMEN: Soft, nontender, nondistended. Bowel sounds present. No organomegaly or mass.  EXTREMITIES: 3+ lower extremity and pedal edema, some calf tenderness on palpation, no cyanosis, or clubbing.  NEUROLOGIC: Cranial nerves II through XII are intact. Muscle strength 5/5 in all extremities. Sensation intact. Gait not checked.  PSYCHIATRIC: The patient is alert and oriented x 3.  SKIN: No obvious rash, lesion, or ulcer.   LABORATORY PANEL:   CBC No results for input(s): WBC, HGB, HCT, PLT in the last 168 hours. ------------------------------------------------------------------------------------------------------------------  Chemistries  No results for input(s): NA, K, CL, CO2, GLUCOSE, BUN, CREATININE, CALCIUM, MG, AST, ALT, ALKPHOS, BILITOT in the last 168 hours.  Invalid input(s): GFRCGP ------------------------------------------------------------------------------------------------------------------  Cardiac Enzymes No results for input(s): TROPONINI in the last 168 hours. ------------------------------------------------------------------------------------------------------------------  RADIOLOGY:  Dg Chest Port 1 View  Result Date: 12/22/2015 CLINICAL DATA:  Post cardiac pacemaker implantation EXAM: PORTABLE CHEST 1 VIEW COMPARISON:  Portable exam 1031 hours without priors for comparison FINDINGS: LEFT subclavian transvenous pacemaker with leads  projecting over RIGHT atrium and RIGHT ventricle. External pacing leads project over chest. Enlargement of cardiac silhouette with pulmonary vascular congestion. Minimal interstitial infiltrates in the perihilar and upper lung regions, question minimal edema. No pleural effusion or pneumothorax. Bones demineralized. IMPRESSION: No pneumothorax following pacemaker insertion. Question minimal pulmonary edema. Electronically Signed   By: Lavonia Dana M.D.   On: 12/22/2015 11:04    EKG:   Orders placed or performed during the hospital encounter of 12/22/15  . EKG 12-Lead in am (before 8am)  . EKG 12-Lead  . EKG 12-Lead  EKG revealed a sinus bradycardia at 49 bpm A-V dissociation junctional rhythm was sinus/Atrial capture, septal infarct age undetermined, T wave abnormalities consider lateral ischemia   IMPRESSION AND PLAN:    Active Problems:   CHB (complete heart block) (HCC)  #1. Malignant essential hypertension, discontinue IV fluids, continue Cozaar, nitroglycerin topically, continue Lasix #2. Acute diastolic CHF, continue Lasix, Cozaar, follow ins and outs, weight #3. Diabetes mellitus type 2, insulin-dependent, get hemoglobin A1c, continue sliding scale insulin, insulin Lantus, patient describes blood glucose levels dipping down to 40s intermittently, We'll continue patient's insulin Lantus at 50 units, sliding scale, following blood glucose levels closely, decrease insulin Lantus dose if blood glucose level is low. Patient's lower extremities are very swollen, get Doppler ultrasound to rule out DVT #4. Complete heart block, status post permanent pacemaker placement 30th of November 2017 by Dr. Saralyn Pilar, patient's heart rate is in the 100s at present,   #5. Acute respiratory failure with hypoxia due to COPD exacerbation, patient is receiving  steroids, nebulizing therapy, has improved significantly, wean off oxygen as tolerated, add antibiotic if expectorations increase. Get CT angiogram of  the chest if the patient's lower extremity Doppler ultrasound reveals deep vein thrombosis #6. Tobacco abuse. Counseling, discussed this patient for approximately 5-6 minutes, nicotine replacement therapy is going to be initiated.     All the records are reviewed and case discussed with Consulting provider. Management plans discussed with the patient, family and they are in agreement.  CODE STATUS: Full code   TOTAL TIME TAKING CARE OF THIS PATIENT: 55 minutes.  Discussed with patient's family. All questions were answered, voiced understanding.    Theodoro Grist M.D on 12/22/2015 at 5:37 PM  Between 7am to 6pm - Pager - [phone]  After 6pm go to [email or web address] - password [email or web address] [facility]  [facility]  Office  [phone]  CC: Primary care Physician: Casilda Carls

## 2015-12-22 NOTE — Progress Notes (Signed)
Dr Mortimer Fries here for consult.

## 2015-12-22 NOTE — Progress Notes (Signed)
EKG per Cardiopulmonary.

## 2015-12-22 NOTE — Progress Notes (Signed)
65 yo wf admitted to room 238 from PACU s/p pacemaker insertion to lt chest.  No distress on 2L O2 per [place]. Cardiac monitor placed on pt and verified with Roselyn Reef, CNA.  Pt denies chest pain at this time.  Lungs diminished lower lobes bil.  1+ pitting edema noted to bil lower extremities.  Sling to lt arm.  IVF infusing well rt fa.  SL lt ac flushes well.  Oriented to room and surroundings.  POC reviewed with pt.  Denies need. CB in reach, SR up x2.

## 2015-12-22 NOTE — Consult Note (Signed)
Name: Lorraine Boone MRN: [id number] DOB: 1950/10/22    ADMISSION DATE:  12/22/2015 CONSULTATION DATE: 12/22/2015  REFERRING MD :  Dr. Saralyn Pilar  CHIEF COMPLAINT:  S/p Pacemaker Placement  BRIEF PATIENT DESCRIPTION:  This is a 65 yo female s/p pacemaker placement who developed acute hypoxic respiratory failure likely secondary to undiagnosed COPD requiring Bipap post procedure.  SIGNIFICANT EVENTS  11/30-s/p pacemaker placement developed acute respiratory failure likely secondary to undiagnosed COPD requiring Bipap 11/30-PCCM consulted for management of acute respiratory failure requiring Bipap post procedure 11/30-Pt transitioned off Bipap to nasal canula   STUDIES:  None  HISTORY OF PRESENT ILLNESS:   This is a 65 yo female with a PMH of Intermittent Complete Heart Block, HTN, Hyperlipidemia, Type II DM, Bradycardia, Arthritis, and Current everyday smoker (36 yr hx-currently smokes 5 cigarettes per day).  She presented to [facility] 11/30 for elective pacemaker placement.  Following procedure she developed acute respiratory failure likely secondary to undiagnosed COPD requiring Bipap.  PCCM consulted 11/30 for additional management.  PAST MEDICAL HISTORY :   has a past medical history of Arthritis; Bradycardia; Diabetes mellitus type 2, uncomplicated ([place]); Hyperlipidemia; Hypertension; and Intermittent complete heart block ([place]).  has a past surgical history that includes Cholecystectomy and Essure tubal ligation. Prior to Admission medications   Medication Sig Start Date End Date Taking? Authorizing Provider  acetaminophen (TYLENOL) 325 MG tablet Take 650 mg by mouth every 6 (six) hours as needed.   Yes Historical Provider, MD  ergocalciferol (VITAMIN D2) 50000 units capsule Take 50,000 Units by mouth once a week.   Yes Historical Provider, MD  furosemide (LASIX) 20 MG tablet Take 20 mg by mouth daily.   Yes Historical Provider, MD  insulin glargine (LANTUS) 100 UNIT/ML injection  Inject 50 Units into the skin daily.   Yes Historical Provider, MD  losartan (COZAAR) 100 MG tablet Take 100 mg by mouth daily.   Yes Historical Provider, MD  rosuvastatin (CRESTOR) 10 MG tablet Take 10 mg by mouth daily.   Yes Historical Provider, MD  Saxagliptin-Metformin (KOMBIGLYZE XR) 2.05-998 MG TB24 Take 1 tablet by mouth 2 (two) times daily.   Yes Historical Provider, MD   No Known Allergies  FAMILY HISTORY:  family history includes Diabetes in her mother; Heart disease in her mother. SOCIAL HISTORY:  reports that she has been smoking Cigarettes.  She has a 8.75 pack-year smoking history. She has never used smokeless tobacco. She reports that she does not drink alcohol or use drugs.  REVIEW OF SYSTEMS:  Positives in BOLD  Constitutional: Negative for fever, chills, weight loss, malaise/fatigue and diaphoresis.  HENT: Negative for hearing loss, ear pain, nosebleeds, congestion, sore throat, neck pain, tinnitus and ear discharge.   Eyes: Negative for blurred vision, double vision, photophobia, pain, discharge and redness.  Respiratory: cough, hemoptysis, sputum production, shortness of breath, wheezing and stridor.   Cardiovascular: chest pain, palpitations, orthopnea, claudication, leg swelling and PND.  Gastrointestinal: Negative for heartburn, nausea, vomiting, abdominal pain, diarrhea, constipation, blood in stool and melena.  Genitourinary: Negative for dysuria, urgency, frequency, hematuria and flank pain.  Musculoskeletal: Negative for myalgias, back pain, joint pain and falls.  Skin: Negative for itching and rash.  Neurological: Negative for dizziness, tingling, tremors, sensory change, speech change, focal weakness, seizures, loss of consciousness, weakness and headaches.  Endo/Heme/Allergies: Negative for environmental allergies and polydipsia. Does not bruise/bleed easily.  SUBJECTIVE:  Pt states she is slightly short of breath, however breathing has improved since she was  placed on Bipap.  VITAL SIGNS: Temp:  [96.8 F (36 C)-97.1 F (36.2 C)] 97.1 F (36.2 C) (11/30 1022) Pulse Rate:  [50-91] 91 (11/30 1340) Resp:  [11-32] 16 (11/30 1340) BP: (163-238)/(37-87) 187/56 (11/30 1340) SpO2:  [93 %-100 %] 97 % (11/30 1340) Weight:  [237 lb (107.5 kg)] 237 lb (107.5 kg) (11/30 0756)  PHYSICAL EXAMINATION: General: acutely ill appearing obese Caucasian female, well nourished, well developed Neuro:  Alert and oriented, follows commands, PERRLA HEENT:  Supple, no JVD Cardiovascular: vpaced, regular rate, no M/R/G Lungs:  Inspiratory wheezes and diminished throughout, mildly tachypneic and labored Abdomen:  +BS x4, obese, soft, non tender, non distended Musculoskeletal:  2+ bilateral non pitting lower extremity edema, normal tone Skin:  Left chest incision s/p pacemaker placement dressing dry and intact, no rashes or lesions  No results for input(s): NA, K, CL, CO2, BUN, CREATININE, GLUCOSE in the last 168 hours. No results for input(s): HGB, HCT, WBC, PLT in the last 168 hours. Dg Chest Port 1 View  Result Date: 12/22/2015 CLINICAL DATA:  Post cardiac pacemaker implantation EXAM: PORTABLE CHEST 1 VIEW COMPARISON:  Portable exam 1031 hours without priors for comparison FINDINGS: LEFT subclavian transvenous pacemaker with leads projecting over RIGHT atrium and RIGHT ventricle. External pacing leads project over chest. Enlargement of cardiac silhouette with pulmonary vascular congestion. Minimal interstitial infiltrates in the perihilar and upper lung regions, question minimal edema. No pleural effusion or pneumothorax. Bones demineralized. IMPRESSION: No pneumothorax following pacemaker insertion. Question minimal pulmonary edema. Electronically Signed   By: Lavonia Dana M.D.   On: 12/22/2015 11:04    ASSESSMENT / PLAN: Acute hypoxic respiratory failure s/p pacemaker placement likely secondary to undiagnosed COPD Hx: Current everyday smoker  P: Bipap prn    Supplemental O2 as needed to maintain O2 sats 88% to 92% Continue scheduled and prn bronchodilators IV steroids Smoking Cessation counseling Will need outpatient PFT's once discharged Early ambulation Cardiology primary management   I have personally obtained a history, examined the patient, evaluated Pertinent laboratory and RadioGraphic/imaging results, and  formulated the assessment and plan   The Patient requires high complexity decision making for assessment and support, frequent evaluation and titration of therapies.  Patient/Family are satisfied with Plan of action and management. All questions answered  Corrin Parker, M.D.  [facility]  Medical Director [place] Director [facility] Cardio-Pulmonary Department

## 2015-12-22 NOTE — Anesthesia Procedure Notes (Signed)
Performed by: Hedda Slade Oxygen Delivery Method: Nasal cannula

## 2015-12-23 ENCOUNTER — Inpatient Hospital Stay: Payer: Medicare Other

## 2015-12-23 ENCOUNTER — Telehealth: Payer: Self-pay | Admitting: Internal Medicine

## 2015-12-23 ENCOUNTER — Encounter: Payer: Self-pay | Admitting: Cardiology

## 2015-12-23 ENCOUNTER — Observation Stay: Payer: Medicare Other

## 2015-12-23 DIAGNOSIS — E669 Obesity, unspecified: Secondary | ICD-10-CM | POA: Diagnosis present

## 2015-12-23 DIAGNOSIS — I110 Hypertensive heart disease with heart failure: Secondary | ICD-10-CM | POA: Diagnosis present

## 2015-12-23 DIAGNOSIS — E119 Type 2 diabetes mellitus without complications: Secondary | ICD-10-CM | POA: Diagnosis present

## 2015-12-23 DIAGNOSIS — I5031 Acute diastolic (congestive) heart failure: Secondary | ICD-10-CM | POA: Diagnosis present

## 2015-12-23 DIAGNOSIS — Z8249 Family history of ischemic heart disease and other diseases of the circulatory system: Secondary | ICD-10-CM | POA: Diagnosis not present

## 2015-12-23 DIAGNOSIS — Z833 Family history of diabetes mellitus: Secondary | ICD-10-CM | POA: Diagnosis not present

## 2015-12-23 DIAGNOSIS — J9601 Acute respiratory failure with hypoxia: Secondary | ICD-10-CM | POA: Diagnosis present

## 2015-12-23 DIAGNOSIS — J441 Chronic obstructive pulmonary disease with (acute) exacerbation: Secondary | ICD-10-CM | POA: Diagnosis present

## 2015-12-23 DIAGNOSIS — R0602 Shortness of breath: Secondary | ICD-10-CM | POA: Diagnosis present

## 2015-12-23 DIAGNOSIS — Z9049 Acquired absence of other specified parts of digestive tract: Secondary | ICD-10-CM | POA: Diagnosis not present

## 2015-12-23 DIAGNOSIS — Z6841 Body Mass Index (BMI) 40.0 and over, adult: Secondary | ICD-10-CM | POA: Diagnosis not present

## 2015-12-23 DIAGNOSIS — I442 Atrioventricular block, complete: Secondary | ICD-10-CM | POA: Diagnosis present

## 2015-12-23 DIAGNOSIS — E785 Hyperlipidemia, unspecified: Secondary | ICD-10-CM | POA: Diagnosis present

## 2015-12-23 DIAGNOSIS — F17210 Nicotine dependence, cigarettes, uncomplicated: Secondary | ICD-10-CM | POA: Diagnosis present

## 2015-12-23 DIAGNOSIS — Z794 Long term (current) use of insulin: Secondary | ICD-10-CM | POA: Diagnosis not present

## 2015-12-23 DIAGNOSIS — R001 Bradycardia, unspecified: Secondary | ICD-10-CM | POA: Diagnosis present

## 2015-12-23 LAB — GLUCOSE, CAPILLARY
GLUCOSE-CAPILLARY: 331 mg/dL — AB (ref 65–99)
GLUCOSE-CAPILLARY: 581 mg/dL — AB (ref 65–99)
Glucose-Capillary: 538 mg/dL (ref 65–99)
Glucose-Capillary: 367 mg/dL — ABNORMAL HIGH (ref 65–99)
Glucose-Capillary: 146 mg/dL — ABNORMAL HIGH (ref 65–99)

## 2015-12-23 LAB — BLOOD GAS, VENOUS
Acid-Base Excess: 2.4 mmol/L — ABNORMAL HIGH (ref 0.0–2.0)
Bicarbonate: 27.2 mmol/L (ref 20.0–28.0)
O2 SAT: 65.1 %
PATIENT TEMPERATURE: 37
PCO2 VEN: 42 mmHg — AB (ref 44.0–60.0)
pH, Ven: 7.42 (ref 7.250–7.430)
pO2, Ven: 33 mmHg (ref 32.0–45.0)

## 2015-12-23 LAB — BASIC METABOLIC PANEL
ANION GAP: 10 (ref 5–15)
BUN: 25 mg/dL — ABNORMAL HIGH (ref 6–20)
CALCIUM: 8.8 mg/dL — AB (ref 8.9–10.3)
CO2: 25 mmol/L (ref 22–32)
Chloride: 104 mmol/L (ref 101–111)
Creatinine, Ser: 1.35 mg/dL — ABNORMAL HIGH (ref 0.44–1.00)
GFR calc Af Amer: 47 mL/min — ABNORMAL LOW (ref >60)
GFR, EST NON AFRICAN AMERICAN: 40 mL/min — AB (ref >60)
GLUCOSE: 355 mg/dL — AB (ref 65–99)
Potassium: 3.6 mmol/L (ref 3.5–5.1)
SODIUM: 139 mmol/L (ref 135–145)

## 2015-12-23 MED ORDER — FUROSEMIDE 10 MG/ML IJ SOLN
40.0000 mg | Freq: Once | INTRAMUSCULAR | Status: AC
  Administered 2015-12-23: 40 mg via INTRAVENOUS
  Filled 2015-12-23: qty 4

## 2015-12-23 MED ORDER — HYDRALAZINE HCL 50 MG PO TABS
50.0000 mg | ORAL_TABLET | Freq: Three times a day (TID) | ORAL | Status: DC
  Administered 2015-12-23 – 2015-12-24 (×3): 50 mg via ORAL
  Filled 2015-12-23 (×3): qty 1

## 2015-12-23 MED ORDER — METOPROLOL TARTRATE 25 MG PO TABS
25.0000 mg | ORAL_TABLET | Freq: Two times a day (BID) | ORAL | Status: DC
  Administered 2015-12-23 – 2015-12-24 (×2): 25 mg via ORAL
  Filled 2015-12-23 (×2): qty 1

## 2015-12-23 MED ORDER — PREDNISONE 20 MG PO TABS
20.0000 mg | ORAL_TABLET | Freq: Every day | ORAL | Status: DC
  Administered 2015-12-24: 20 mg via ORAL
  Filled 2015-12-23: qty 1

## 2015-12-23 MED ORDER — FUROSEMIDE 40 MG PO TABS: 40.0000 mg | ORAL_TABLET | Freq: Every day | ORAL | 5 refills | Status: DC

## 2015-12-23 MED ORDER — METHYLPREDNISOLONE SODIUM SUCC 40 MG IJ SOLR: 40.0000 mg | Freq: Every day | INTRAMUSCULAR | Status: DC

## 2015-12-23 MED ORDER — FUROSEMIDE 10 MG/ML IJ SOLN
40.0000 mg | Freq: Two times a day (BID) | INTRAMUSCULAR | Status: DC
  Administered 2015-12-23 – 2015-12-24 (×3): 40 mg via INTRAVENOUS
  Filled 2015-12-23 (×3): qty 4

## 2015-12-23 MED ORDER — CEPHALEXIN 250 MG PO CAPS: 250.0000 mg | ORAL_CAPSULE | Freq: Four times a day (QID) | ORAL | 0 refills | Status: DC

## 2015-12-23 MED ORDER — METHYLPREDNISOLONE SODIUM SUCC 40 MG IJ SOLR: 40.0000 mg | Freq: Two times a day (BID) | INTRAMUSCULAR | Status: DC

## 2015-12-23 MED ORDER — BUDESONIDE 0.25 MG/2ML IN SUSP
0.2500 mg | Freq: Two times a day (BID) | RESPIRATORY_TRACT | Status: DC
  Administered 2015-12-23: 0.25 mg via RESPIRATORY_TRACT
  Filled 2015-12-23: qty 2

## 2015-12-23 MED ORDER — INSULIN ASPART 100 UNIT/ML ~~LOC~~ SOLN
26.0000 [IU] | Freq: Once | SUBCUTANEOUS | Status: AC
  Administered 2015-12-23: 26 [IU] via SUBCUTANEOUS

## 2015-12-23 MED ORDER — BUDESONIDE 0.5 MG/2ML IN SUSP
0.5000 mg | Freq: Two times a day (BID) | RESPIRATORY_TRACT | Status: DC
  Administered 2015-12-23 – 2015-12-24 (×2): 0.5 mg via RESPIRATORY_TRACT
  Filled 2015-12-23 (×2): qty 2

## 2015-12-23 MED ORDER — METOPROLOL TARTRATE 25 MG PO TABS
12.5000 mg | ORAL_TABLET | Freq: Two times a day (BID) | ORAL | Status: DC
  Administered 2015-12-23: 12.5 mg via ORAL
  Filled 2015-12-23: qty 1

## 2015-12-23 MED ORDER — HYDRALAZINE HCL 25 MG PO TABS
25.0000 mg | ORAL_TABLET | Freq: Three times a day (TID) | ORAL | Status: DC
  Administered 2015-12-23: 25 mg via ORAL
  Filled 2015-12-23: qty 1

## 2015-12-23 MED ORDER — OXYCODONE HCL 5 MG PO TABS
5.0000 mg | ORAL_TABLET | Freq: Four times a day (QID) | ORAL | Status: DC | PRN
  Administered 2015-12-23 – 2015-12-24 (×2): 5 mg via ORAL
  Filled 2015-12-23 (×3): qty 1

## 2015-12-23 MED ORDER — IPRATROPIUM-ALBUTEROL 0.5-2.5 (3) MG/3ML IN SOLN
3.0000 mL | RESPIRATORY_TRACT | Status: DC
  Administered 2015-12-23 – 2015-12-24 (×2): 3 mL via RESPIRATORY_TRACT
  Filled 2015-12-23 (×2): qty 3

## 2015-12-23 NOTE — Progress Notes (Signed)
PT Cancellation Note  Patient Details Name: Lorraine Boone MRN: [id number] DOB: 11-03-1950   Cancelled Treatment:    Reason Eval/Treat Not Completed: Patient not medically ready Pt's has been dyspneic even without exertion, blood sugars have been elevated (>500 earlier, still >350), she has had elevated systolic and depressed diastolic BP (MDs still adjusting meds to deal with this) and after talking with nursing we decided to hold PT until tomorrow.    Kreg Shropshire, DPT 12/23/2015, 3:45 PM

## 2015-12-23 NOTE — Telephone Encounter (Signed)
Scheduled 12/14 at 10 am with [facility]

## 2015-12-23 NOTE — Progress Notes (Signed)
Patient c/o SOB and coughing up bloody sputum. Nebulizer treatments have been administered, bilateral crackles auscultated in the lower bases. MD Paraschos made aware of situation. Will administer 40 mg of Lasix IV as ordered. Will continue to monitor.

## 2015-12-23 NOTE — Progress Notes (Signed)
Pts CBG is 538. MD notified. Orders to give 26 units SSI, change solumedrol to 40mg  every day and hold todays dose due at 1400. I will continue to assess.

## 2015-12-23 NOTE — Care Management (Signed)
Patient had permanent pacemaker insertion 11.30 and was scheduled for discharge by cardiology today. Review of vital sign flowsheet shows that patient was on bipap 11.30 afternoon post procedure then on nasal cannula.  Had been assessed by pulmonary for this respiratory difficulty and felt patient experiencing this due to undiagnosed COPD.  She received duonebs and IV solumedrol. The lowest 02 sat documented appears to be 91%. Per hospitalist, when he assessed patient this mnorning she was extremely dyspneic and "not moving much air."  discussed the need for home 02 assessment.

## 2015-12-23 NOTE — Progress Notes (Signed)
SATURATION QUALIFICATIONS: (This note is used to comply with regulatory documentation for home oxygen)  Patient Saturations on Room Air at Rest  93%  Patient Saturations on Room Air while Ambulating 88%  Patient Saturations on 2 Liters of oxygen while Ambulating 96%  COPD

## 2015-12-23 NOTE — Care Management (Signed)
Spoke with patient and daughter.  Patient has been having progressive shortness of breath over the past few months.  She has to rest frequently when walking.  She has qualified for hoe 02.  Obtained the order for the 02.  Patient is also agreeable to home health SN PT OT.  Agency preference for DME and services is [facility].  PT consult is pending.  Patient confirms her contact information and current with her pcp.  Heads up referral to [facility] for SN PT OT and the home 02.  Tank will be delivered to patient's room in anticipation of discharge home over the weekend

## 2015-12-23 NOTE — Care Management (Signed)
Patient has qualified for home 02 and have requested order from attending

## 2015-12-23 NOTE — Consult Note (Signed)
Name: Lorraine Boone MRN: [id number] DOB: 03/03/1950    ADMISSION DATE:  12/22/2015 CONSULTATION DATE: 12/22/2015  REFERRING MD :  Dr. Saralyn Pilar  CHIEF COMPLAINT:  S/p Pacemaker Placement  BRIEF PATIENT DESCRIPTION:  This is a 65 yo female s/p pacemaker placement who developed acute hypoxic respiratory failure likely secondary to undiagnosed COPD requiring Bipap post procedure.  SIGNIFICANT EVENTS  11/30-s/p pacemaker placement developed acute respiratory failure likely secondary to undiagnosed COPD requiring Bipap 11/30-PCCM consulted for management of acute respiratory failure requiring Bipap post procedure 11/30-Pt transitioned off Bipap to nasal canula  12/1 d/c cancelled for increased WOB   STUDIES:  None  HISTORY OF PRESENT ILLNESS:   This is a 65 yo female with a PMH of Intermittent Complete Heart Block, HTN, Hyperlipidemia, Type II DM, Bradycardia, Arthritis, and Current everyday smoker (36 yr hx-currently smokes 5 cigarettes per day).  She presented to [facility] 11/30 for elective pacemaker placement.  Following procedure she developed acute respiratory failure likely secondary to undiagnosed COPD requiring Bipap.  PCCM consulted 11/30 for additional management.   SUBJECTIVE: Remains SOB But better since yesterday REVIEW OF SYSTEMS:  Positives in BOLD  Constitutional: Negative for fever, chills, weight loss, malaise/fatigue and diaphoresis.  HENT: Negative for hearing loss, ear pain, nosebleeds, congestion, sore throat, neck pain, tinnitus and ear discharge.   Eyes: Negative for blurred vision, double vision, photophobia, pain, discharge and redness.  Respiratory: cough, hemoptysis, sputum production, shortness of breath, wheezing and stridor.   Cardiovascular: chest pain, palpitations, orthopnea, claudication, leg swelling and PND.  Gastrointestinal: Negative for heartburn, nausea, vomiting, abdominal pain, diarrhea, constipation, blood in stool and melena.  Genitourinary:  Negative for dysuria, urgency, frequency, hematuria and flank pain.  Musculoskeletal: Negative for myalgias, back pain, joint pain and falls.  Skin: Negative for itching and rash.  Neurological: Negative for dizziness, tingling, tremors, sensory change, speech change, focal weakness, seizures, loss of consciousness, weakness and headaches.  Endo/Heme/Allergies: Negative for environmental allergies and polydipsia. Does not bruise/bleed easily.   VITAL SIGNS: Temp:  [97.6 F (36.4 C)-98 F (36.7 C)] 98 F (36.7 C) (12/01 1156) Pulse Rate:  [61-102] 61 (12/01 1156) Resp:  [16-20] 16 (12/01 1156) BP: (160-198)/(46-68) 198/46 (12/01 1156) SpO2:  [93 %-97 %] 93 % (12/01 1156)  PHYSICAL EXAMINATION: General: acutely ill appearing obese Caucasian female, well nourished, well developed Neuro:  Alert and oriented, follows commands, PERRLA HEENT:  Supple, no JVD Cardiovascular: vpaced, regular rate, no M/R/G Lungs:  Inspiratory wheezes and diminished throughout, mildly tachypneic and labored Abdomen:  +BS x4, obese, soft, non tender, non distended Musculoskeletal:  2+ bilateral non pitting lower extremity edema, normal tone Skin:  Left chest incision s/p pacemaker placement dressing dry and intact, no rashes or lesions   Recent Labs Lab 12/23/15 0343  NA 139  K 3.6  CL 104  CO2 25  BUN 25*  CREATININE 1.35*  GLUCOSE 355*   No results for input(s): HGB, HCT, WBC, PLT in the last 168 hours. Dg Chest 1 View  Result Date: 12/23/2015 CLINICAL DATA:  Shortness of breath following pacemaker insertion EXAM: CHEST 1 VIEW COMPARISON:  12/22/2015 FINDINGS: Cardiac shadow is stable. Pacing device is again seen and stable. The lungs are well aerated bilaterally. Persistent vascular congestion is noted with interstitial edema new from the prior exam. No sizable effusion is seen. No bony abnormality is noted. IMPRESSION: Increase in the degree of vascular congestion and edema. Electronically Signed    By: Linus Mako.D.  On: 12/23/2015 10:29   US Venous Img Lower Bilateral  Result Date: 12/23/2015 CLINICAL DATA:  Swelling. EXAM: BILATERAL LOWER EXTREMITY VENOUS DOPPLER ULTRASOUND TECHNIQUE: Gray-scale sonography with graded compression, as well as color Doppler and duplex ultrasound were performed to evaluate the lower extremity deep venous systems from the level of the common femoral vein and including the common femoral, femoral, profunda femoral, popliteal and calf veins including the posterior tibial, peroneal and gastrocnemius veins when visible. The superficial great saphenous vein was also interrogated. Spectral Doppler was utilized to evaluate flow at rest and with distal augmentation maneuvers in the common femoral, femoral and popliteal veins. COMPARISON:  No recent prior. FINDINGS: RIGHT LOWER EXTREMITY Common Femoral Vein: No evidence of thrombus. Normal compressibility, respiratory phasicity and response to augmentation. Saphenofemoral Junction: No evidence of thrombus. Normal compressibility and flow on color Doppler imaging. Profunda Femoral Vein: No evidence of thrombus. Normal compressibility and flow on color Doppler imaging. Femoral Vein: No evidence of thrombus. Normal compressibility, respiratory phasicity and response to augmentation. Popliteal Vein: No evidence of thrombus. Normal compressibility, respiratory phasicity and response to augmentation. Calf Veins: No evidence of thrombus. Normal compressibility and flow on color Doppler imaging. Superficial Great Saphenous Vein: No evidence of thrombus. Normal compressibility and flow on color Doppler imaging. Other Findings:  None. LEFT LOWER EXTREMITY Common Femoral Vein: No evidence of thrombus. Normal compressibility, respiratory phasicity and response to augmentation. Saphenofemoral Junction: No evidence of thrombus. Normal compressibility and flow on color Doppler imaging. Profunda Femoral Vein: No evidence of thrombus. Normal  compressibility and flow on color Doppler imaging. Femoral Vein: No evidence of thrombus. Normal compressibility, respiratory phasicity and response to augmentation. Popliteal Vein: No evidence of thrombus. Normal compressibility, respiratory phasicity and response to augmentation. Calf Veins: No evidence of thrombus. Normal compressibility and flow on color Doppler imaging. Superficial Great Saphenous Vein: No evidence of thrombus. Normal compressibility and flow on color Doppler imaging. Other Findings:  None. IMPRESSION: No evidence of deep venous thrombosis. Electronically Signed   By: Marcello Moores  Register   On: 12/23/2015 10:05   Dg Chest Port 1 View  Result Date: 12/22/2015 CLINICAL DATA:  Post cardiac pacemaker implantation EXAM: PORTABLE CHEST 1 VIEW COMPARISON:  Portable exam 1031 hours without priors for comparison FINDINGS: LEFT subclavian transvenous pacemaker with leads projecting over RIGHT atrium and RIGHT ventricle. External pacing leads project over chest. Enlargement of cardiac silhouette with pulmonary vascular congestion. Minimal interstitial infiltrates in the perihilar and upper lung regions, question minimal edema. No pleural effusion or pneumothorax. Bones demineralized. IMPRESSION: No pneumothorax following pacemaker insertion. Question minimal pulmonary edema. Electronically Signed   By: Lavonia Dana M.D.   On: 12/22/2015 11:04    ASSESSMENT / PLAN: Acute hypoxic respiratory failure s/p pacemaker placement likely secondary to undiagnosed COPD Hx: Current everyday smoker  P: Bipap prn  Supplemental O2 as needed to maintain O2 sats 88% to 92% Continue scheduled and prn bronchodilators IV steroids Smoking Cessation counseling Will need outpatient PFT's once discharged Early ambulation Cardiology primary management   I have personally obtained a history, examined the patient, evaluated Pertinent laboratory and RadioGraphic/imaging results, and  formulated the assessment and  plan   The Patient requires high complexity decision making for assessment and support, frequent evaluation and titration of therapies.  Patient/Family are satisfied with Plan of action and management. All questions answered  Corrin Parker, M.D.  [facility]  Medical Director [place] Director [facility] Cardio-Pulmonary Department

## 2015-12-23 NOTE — Progress Notes (Signed)
Patient ID: Lorraine Boone, female   DOB: 11-25-1950, 65 y.o.   MRN: [id number]  [facility] PROGRESS NOTE  Lorraine Boone [id number] DOB: 04-26-1950 DOA: 12/22/2015 PCP: Lorraine Boone  HPI/Subjective: I walked in the room and patient using accessory muscles to breathe. Feels short of breath. Noticed swelling on her legs. She smokes about 5 cigarettes a day. She had a pacemaker placed for complete heart block.  Objective: Vitals:   12/23/15 0126 12/23/15 0528  BP: (!) 164/68 (!) 179/53  Pulse: (!) 102 90  Resp:  20  Temp:  97.8 F (36.6 C)    Filed Weights   12/22/15 0756  Weight: 107.5 kg (237 lb)    ROS: Review of Systems  Constitutional: Negative for chills and fever.  Eyes: Negative for blurred vision.  Respiratory: Positive for cough, shortness of breath and wheezing.   Cardiovascular: Negative for chest pain.  Gastrointestinal: Negative for abdominal pain, constipation, diarrhea, nausea and vomiting.  Genitourinary: Negative for dysuria.  Musculoskeletal: Negative for joint pain.  Neurological: Negative for dizziness and headaches.   Exam: Physical Exam  Constitutional: She is oriented to person, place, and time.  HENT:  Nose: No mucosal edema.  Mouth/Throat: No oropharyngeal exudate or posterior oropharyngeal edema.  Eyes: Conjunctivae, EOM and lids are normal. Pupils are equal, round, and reactive to light.  Neck: No JVD present. Carotid bruit is not present. No edema present. No thyroid mass and no thyromegaly present.  Cardiovascular: Regular rhythm, S1 normal and S2 normal.  Exam reveals no gallop.   No murmur heard. Pulses:      Dorsalis pedis pulses are 2+ on the right side, and 2+ on the left side.  Respiratory: Accessory muscle usage present. She is in respiratory distress. She has decreased breath sounds in the right middle field, the right lower field, the left middle field and the left lower field. She has wheezes in the right middle field and the  left middle field. She has no rhonchi. She has rales in the right lower field and the left lower field.  GI: Soft. Bowel sounds are normal. There is no tenderness.  Musculoskeletal:       Right ankle: She exhibits swelling.       Left ankle: She exhibits swelling.  Lymphadenopathy:    She has no cervical adenopathy.  Neurological: She is alert and oriented to person, place, and time. No cranial nerve deficit.  Skin: Skin is warm. No rash noted. Nails show no clubbing.  Psychiatric: She has a normal mood and affect.      Data Reviewed: Basic Metabolic Panel:  Recent Labs Lab 12/23/15 0343  NA 139  K 3.6  CL 104  CO2 25  GLUCOSE 355*  BUN 25*  CREATININE 1.35*  CALCIUM 8.8*    CBG:  Recent Labs Lab 12/22/15 1033 12/22/15 1532 12/22/15 1753 12/22/15 2047 12/23/15 0748  GLUCAP 118* 234* 295* 227* 331*    Recent Results (from the past 240 hour(s))  Surgical pcr screen     Status: None   Collection Time: 12/21/15  8:21 AM  Result Value Ref Range Status   MRSA, PCR NEGATIVE NEGATIVE Final   Staphylococcus aureus NEGATIVE NEGATIVE Final    Comment:        The Xpert SA Assay (FDA approved for NASAL specimens in patients over 42 years of age), is one component of a comprehensive surveillance program.  Test performance has been validated by [facility] for patients greater than or equal to  43 year old. It is not intended to diagnose infection nor to guide or monitor treatment.      Studies: Dg Chest Port 1 View  Result Date: 12/22/2015 CLINICAL DATA:  Post cardiac pacemaker implantation EXAM: PORTABLE CHEST 1 VIEW COMPARISON:  Portable exam 1031 hours without priors for comparison FINDINGS: LEFT subclavian transvenous pacemaker with leads projecting over RIGHT atrium and RIGHT ventricle. External pacing leads project over chest. Enlargement of cardiac silhouette with pulmonary vascular congestion. Minimal interstitial infiltrates in the perihilar and upper lung  regions, question minimal edema. No pleural effusion or pneumothorax. Bones demineralized. IMPRESSION: No pneumothorax following pacemaker insertion. Question minimal pulmonary edema. Electronically Signed   By: Lavonia Dana M.D.   On: 12/22/2015 11:04    Scheduled Meds: . budesonide (PULMICORT) nebulizer solution  0.25 mg Nebulization BID  . furosemide  40 mg Intravenous BID  . insulin aspart  0-20 Units Subcutaneous TID WC  . insulin aspart  0-5 Units Subcutaneous QHS  . insulin glargine  50 Units Subcutaneous Daily  . ipratropium-albuterol  3 mL Nebulization Q6H  . losartan  100 mg Oral Daily  . methylPREDNISolone (SOLU-MEDROL) injection  40 mg Intravenous Q12H  . nicotine  21 mg Transdermal Daily  . nitroGLYCERIN  1 inch Topical Q6H  . rosuvastatin  10 mg Oral q1800    Assessment/Plan:  1. Acute respiratory failure with hypoxia. Patient using accessory muscles to breathe and is currently on 4 L of oxygen. Patient with poor air entry. Get an overnight oximetry. May end up needing noninvasive ventilation at night. Repeat a chest x-ray. Care manager consultation may end up needing home oxygen. 2. Acute diastolic congestive heart failure. Lasix 40 mg IV twice a day. Repeat chest x-ray 3. COPD exacerbation. Continue Solu-Medrol and nebulizer treatments and budesonide nebulizers. 4. Type 2 diabetes mellitus. Patient states that she has low sugars at home. Currently high sugars with the steroids. 5. Accelerated hypertension overnight. This is probably because she is having trouble breathing. Continue losartan. Lasix may also help. Heart rate higher than expected after a pacemaker placement. Continue to monitor. Add hydralazine. 6. Tobacco abuse smoking cessation counseling done 3 minutes by me and nicotine patch applied 7. Likely sleep apnea. Overnight oximetry 8. Hyperlipidemia unspecified on Crestor 9. Weakness. Physical therapy evaluation  Code Status:     Code Status Orders         Start     Ordered   12/22/15 1452  Full code  Continuous     12/22/15 1451    Code Status History    Date Active Date Inactive Code Status Order ID Comments User Context   This patient has a current code status but no historical code status.     Family Communication: Daughter and son-in-law at the bedside Disposition Plan: [place] discharge today, transferred to medicine service, changed to inpatient. Evaluate daily on respiratory status.  Procedures: - Pacemaker placement  Time spent: 30 minutes  Loletha Grayer  [employer]

## 2015-12-23 NOTE — Discharge Summary (Signed)
Physician Discharge Summary  Patient ID: Lorraine Boone MRN: [id number] DOB/AGE: [birth year] 65 y.o.  Admit date: 12/22/2015 Discharge date: 12/23/2015  Primary Discharge Diagnosis bradycardia Secondary Discharge Diagnosis complete heart block  Significant Diagnostic Studies: cardiac graphics: ECG: Type II second degree AV block and yes  Consults: pulmonary/intensive care  Hospital Course: The patient underwent elective dual-chamber pacemaker implantation on 0000000 without complication. Following the procedure the patient developed  respiratory distress due to reactive airway disease. Pulmonary consul twas obtained, the patient was started on when necessary nebulizers with plan for discharge as outpatient. The patient was also given a dose of intravenous furosemide with resolution of pulmonary edema.on the morning of 12/23/2015, the patient was able to ambulate without difficulty and was discharged home.   Discharge Exam: Blood pressure (!) 179/53, pulse 90, temperature 97.8 F (36.6 C), temperature source Oral, resp. rate 20, height 5\' 3"  (1.6 m), weight 107.5 kg (237 lb), SpO2 97 %.   General appearance: alert Head: Normocephalic, without obvious abnormality, atraumatic Eyes: conjunctivae/corneas clear. PERRL, EOM's intact. Fundi benign. Ears: normal TM's and external ear canals both ears Nose: Nares normal. Septum midline. Mucosa normal. No drainage or sinus tenderness. Throat: lips, mucosa, and tongue normal; teeth and gums normal Neck: no adenopathy, no carotid bruit, no JVD, supple, symmetrical, trachea midline and thyroid not enlarged, symmetric, no tenderness/mass/nodules Back: symmetric, no curvature. ROM normal. No CVA tenderness. Resp: clear to auscultation bilaterally Chest wall: no tenderness Cardio: regular rate and rhythm, S1, S2 normal, no murmur, click, rub or gallop GI: soft, non-tender; bowel sounds normal; no masses,  no organomegaly Pelvic: cervix normal in  appearance, external genitalia normal, no adnexal masses or tenderness, no cervical motion tenderness, rectovaginal septum normal, uterus normal size, shape, and consistency and vagina normal without discharge Extremities: extremities normal, atraumatic, no cyanosis or edema Pulses: 2+ and symmetric Lymph nodes: Cervical, supraclavicular, and axillary nodes normal. Neurologic: Grossly normal Labs:  No results found for: WBC, HGB, HCT, MCV, PLT  Recent Labs Lab 12/23/15 0343  NA 139  K 3.6  CL 104  CO2 25  BUN 25*  CREATININE 1.35*  CALCIUM 8.8*  GLUCOSE 355*      Radiology:  EKG: atrial sensing with ventricular pacing  FOLLOW UP PLANS AND APPOINTMENTS    Medication List    TAKE these medications   acetaminophen 325 MG tablet Commonly known as:  TYLENOL Take 650 mg by mouth every 6 (six) hours as needed.   cephALEXin 250 MG capsule Commonly known as:  KEFLEX Take 1 capsule (250 mg total) by mouth 4 (four) times daily.   ergocalciferol 50000 units capsule Commonly known as:  VITAMIN D2 Take 50,000 Units by mouth once a week.   furosemide 20 MG tablet Commonly known as:  LASIX Take 20 mg by mouth daily. What changed:  Another medication with the same name was added. Make sure you understand how and when to take each.   furosemide 40 MG tablet Commonly known as:  LASIX Take 1 tablet (40 mg total) by mouth daily. What changed:  You were already taking a medication with the same name, and this prescription was added. Make sure you understand how and when to take each.   insulin glargine 100 UNIT/ML injection Commonly known as:  LANTUS Inject 50 Units into the skin daily.   KOMBIGLYZE XR 2.05-998 MG Tb24 Generic drug:  Saxagliptin-Metformin Take 1 tablet by mouth 2 (two) times daily.   losartan 100 MG tablet Commonly known as:  COZAAR Take 100 mg by mouth daily.   rosuvastatin 10 MG tablet Commonly known as:  CRESTOR Take 10 mg by mouth daily.       Follow-up Information    Isaias Cowman, MD Follow up in 1 week(s).   Specialty:  Cardiology Contact information: [facility] [place] [address] [phone]           BRING ALL MEDICATIONS WITH YOU TO FOLLOW UP APPOINTMENTS  Time spent with patient to include physician time: 25 minutes Signed:  Isaias Cowman MD, PhD, [facility] 12/23/2015, 8:07 AM

## 2015-12-23 NOTE — Progress Notes (Signed)
MD notified pt was AV paced in the 60s now v paced in 100s. I will await any new orders and continue to assess.

## 2015-12-23 NOTE — Telephone Encounter (Signed)
-----   Message from Renelda Mom, [place] sent at [id number]  3:10 PM EST ----- Please schedule as requested below.  Thanks, Misty ----- Message ----- From: Wilhelmina Mcardle, MD Sent: 12/22/2015   2:00 PM To: Renelda Mom, LPN  Please schedule post hospital follow up with Dr Mortimer Fries in 2-3 weeks for COPD, smoking  Thanks  Waunita Schooner

## 2015-12-24 LAB — HEMOGLOBIN A1C
HEMOGLOBIN A1C: 6.4 % — AB (ref 4.8–5.6)
MEAN PLASMA GLUCOSE: 137 mg/dL

## 2015-12-24 LAB — GLUCOSE, CAPILLARY: Glucose-Capillary: 103 mg/dL — ABNORMAL HIGH (ref 65–99)

## 2015-12-24 MED ORDER — IPRATROPIUM-ALBUTEROL 0.5-2.5 (3) MG/3ML IN SOLN: 3.0000 mL | Freq: Four times a day (QID) | RESPIRATORY_TRACT | 1 refills | Status: AC | PRN

## 2015-12-24 MED ORDER — METOPROLOL TARTRATE 25 MG PO TABS: 25.0000 mg | ORAL_TABLET | Freq: Two times a day (BID) | ORAL | 0 refills | Status: DC

## 2015-12-24 MED ORDER — IPRATROPIUM-ALBUTEROL 20-100 MCG/ACT IN AERS: 1.0000 | INHALATION_SPRAY | Freq: Four times a day (QID) | RESPIRATORY_TRACT | 0 refills | Status: DC

## 2015-12-24 MED ORDER — TIOTROPIUM BROMIDE MONOHYDRATE 18 MCG IN CAPS: 18.0000 ug | ORAL_CAPSULE | Freq: Every day | RESPIRATORY_TRACT | 12 refills | Status: DC

## 2015-12-24 MED ORDER — FLUTICASONE-SALMETEROL 250-50 MCG/DOSE IN AEPB: 1.0000 | INHALATION_SPRAY | Freq: Two times a day (BID) | RESPIRATORY_TRACT | 0 refills | Status: DC

## 2015-12-24 MED ORDER — SODIUM CHLORIDE 0.9 % IV BOLUS (SEPSIS): 1000.0000 mL | Freq: Once | INTRAVENOUS | Status: DC

## 2015-12-24 MED ORDER — IPRATROPIUM-ALBUTEROL 0.5-2.5 (3) MG/3ML IN SOLN: 3.0000 mL | Freq: Four times a day (QID) | RESPIRATORY_TRACT | Status: DC

## 2015-12-24 MED ORDER — FUROSEMIDE 20 MG PO TABS: 40.0000 mg | ORAL_TABLET | Freq: Every day | ORAL | 0 refills | Status: DC

## 2015-12-24 NOTE — Discharge Instructions (Signed)
Do not lift left arm above head. May shower 12/24/2015. Keep dressing dry. [place] [facility] [place]:  Cardiac diet, diabetic diet  DISCHARGE CONDITION:  Stable  ACTIVITY:  Activity as tolerated  OXYGEN:  Home Oxygen: yes   Oxygen Delivery: 2 liters/min via Patient connected to nasal cannula oxygen  DISCHARGE LOCATION:  home    ADDITIONAL DISCHARGE INSTRUCTION:   If you experience worsening of your admission symptoms, develop shortness of breath, life threatening emergency, suicidal or homicidal thoughts you must seek medical attention immediately by calling 911 or calling your MD immediately  if symptoms less severe.  You Must read complete instructions/literature along with all the possible adverse reactions/side effects for all the Medicines you take and that have been prescribed to you. Take any new Medicines after you have completely understood and accpet all the possible adverse reactions/side effects.   Please note  You were cared for by a hospitalist during your hospital stay. If you have any questions about your discharge medications or the care you received while you were in the hospital after you are discharged, you can call the unit and asked to speak with the hospitalist on call if the hospitalist that took care of you is not available. Once you are discharged, your primary care physician will handle any further medical issues. Please note that NO REFILLS for any discharge medications will be authorized once you are discharged, as it is imperative that you return to your primary care physician (or establish a relationship with a primary care physician if you do not have one) for your aftercare needs so that they can reassess your need for medications and monitor your lab values.

## 2015-12-24 NOTE — Plan of Care (Signed)
Problem: Activity: Goal: Risk for activity intolerance will decrease Outcome: Progressing Patient's improving with her ambulating; 1 assist to [facility].

## 2015-12-24 NOTE — Progress Notes (Signed)
[place]  SUBJECTIVE: breathing better this morning. Off oxygen. Pulse oximetry was 92%. Denies discomfort at pacemaker site.   Vitals:   12/23/15 2019 12/24/15 0431 12/24/15 0721 12/24/15 0819  BP: 133/71 (!) 160/57  (!) 144/48  Pulse: 89 66  80  Resp: 18 18    Temp: 98.2 F (36.8 C) 98.2 F (36.8 C)    TempSrc: Oral Oral    SpO2: 96% 96% 92% 92%  Weight:      Height:        Intake/Output Summary (Last 24 hours) at 12/24/15 0845 Last data filed at 12/24/15 [id number]  Gross per 24 hour  Intake              720 ml  Output             1250 ml  Net             -530 ml    LABS: Basic Metabolic Panel:  Recent Labs  12/23/15 0343  NA 139  K 3.6  CL 104  CO2 25  GLUCOSE 355*  BUN 25*  CREATININE 1.35*  CALCIUM 8.8*   Liver Function Tests: No results for input(s): AST, ALT, ALKPHOS, BILITOT, PROT, ALBUMIN in the last 72 hours. No results for input(s): LIPASE, AMYLASE in the last 72 hours. CBC: No results for input(s): WBC, NEUTROABS, HGB, HCT, MCV, PLT in the last 72 hours. Cardiac Enzymes: No results for input(s): CKTOTAL, CKMB, CKMBINDEX, TROPONINI in the last 72 hours. BNP: Invalid input(s): POCBNP D-Dimer: No results for input(s): DDIMER in the last 72 hours. Hemoglobin A1C:  Recent Labs  12/23/15 0343  HGBA1C 6.4*   Fasting Lipid Panel: No results for input(s): CHOL, HDL, LDLCALC, TRIG, CHOLHDL, LDLDIRECT in the last 72 hours. Thyroid Function Tests: No results for input(s): TSH, T4TOTAL, T3FREE, THYROIDAB in the last 72 hours.  Invalid input(s): FREET3 Anemia Panel: No results for input(s): VITAMINB12, FOLATE, FERRITIN, TIBC, IRON, RETICCTPCT in the last 72 hours.   Physical Exam: Blood pressure (!) 144/48, pulse 80, temperature 98.2 F (36.8 C), temperature source Oral, resp. rate 18, height 5\' 3"  (1.6 m), weight 237 lb (107.5 kg), SpO2 92 %.   Wt Readings from Last 1 Encounters:  12/22/15 237 lb (107.5 kg)      General appearance: alert and cooperative Resp: rhonchi bilaterally Cardio: regular rate and rhythm GI: soft, non-tender; bowel sounds normal; no masses,  no organomegaly Extremities: extremities normal, atraumatic, no cyanosis or edema Neurologic: Grossly normal  TELEMETRY: Reviewed telemetry pt in ventricular paced rhythm  ASSESSMENT AND PLAN:  Active Problems:   CHB (complete heart block) (HCC)-complete heart block. Atrial sensed and ventricular paced rhythm.outside removed from pacemaker site. Appears intact. Would continue with current regimen. Okay for discharge from cardiac standpoint. Follow-up as an outpatient in one week to be enrolled inpacemaker clinic.   Acute respiratory failure with hypoxia (HCC)-appears to be breathing a lot better after diuresis. Pulse oximetry this morning was 92% on room air    Teodoro Spray, MD, [facility] 12/24/2015 8:45 AM

## 2015-12-24 NOTE — Discharge Summary (Signed)
[employer] [facility], 65 y.o., DOB 1950-04-10, MRN [id number]. Admission date: 12/22/2015 Discharge Date 12/24/2015 Primary MD Casilda Carls Admitting Physician Isaias Cowman, MD  Admission Diagnosis  bradycardia  Discharge Diagnosis   Active Problems:   CHB (complete heart block) (HCC)   Acute respiratory failure with hypoxia (HCC)  Acute diastolic CHF  Likely underlying COPD Diabetes type 2 Accelerated hypertension Tobacco abuse Possible sleep apnea Hyperlipidemia unspecified       Hospital Course  Patient is a 65 year old with past medical history intermittent complete heart block who was admitted for elective pacemaker placement on November 30. Following procedure patient developed acute respiratory failure felt to be due to under diagnosed COPD as well as possible CHF exasperation. Patient has had a echo recently which showed normal EF and no diastolic CHF however her chest x-ray findings were consistent with CHF. She was seen by pulmonary and placed on BiPAP. Subsequently transitioned to nasal cannula. Patient's breathing is much improved however she still hypoxic in need of home oxygen therapy. Patient will need outpatient pulmonary follow-up and sleep study. She is doing much better and is stable for discharge.           Consults  cardiology, pulmonary  Significant Tests:  See full reports for all details    Dg Chest 1 View  Result Date: 12/23/2015 CLINICAL DATA:  Shortness of breath following pacemaker insertion EXAM: CHEST 1 VIEW COMPARISON:  12/22/2015 FINDINGS: Cardiac shadow is stable. Pacing device is again seen and stable. The lungs are well aerated bilaterally. Persistent vascular congestion is noted with interstitial edema new from the prior exam. No sizable effusion is seen. No bony abnormality is noted. IMPRESSION: Increase in the degree of vascular congestion and edema. Electronically Signed   By: Inez Catalina M.D.   On: 12/23/2015 10:29   US Venous Img Lower Bilateral  Result Date: 12/23/2015 CLINICAL DATA:  Swelling. EXAM: BILATERAL LOWER EXTREMITY VENOUS DOPPLER ULTRASOUND TECHNIQUE: Gray-scale sonography with graded compression, as well as color Doppler and duplex ultrasound were performed to evaluate the lower extremity deep venous systems from the level of the common femoral vein and including the common femoral, femoral, profunda femoral, popliteal and calf veins including the posterior tibial, peroneal and gastrocnemius veins when visible. The superficial great saphenous vein was also interrogated. Spectral Doppler was utilized to evaluate flow at rest and with distal augmentation maneuvers in the common femoral, femoral and popliteal veins. COMPARISON:  No recent prior. FINDINGS: RIGHT LOWER EXTREMITY Common Femoral Vein: No evidence of thrombus. Normal compressibility, respiratory phasicity and response to augmentation. Saphenofemoral Junction: No evidence of thrombus. Normal compressibility and flow on color Doppler imaging. Profunda Femoral Vein: No evidence of thrombus. Normal compressibility and flow on color Doppler imaging. Femoral Vein: No evidence of thrombus. Normal compressibility, respiratory phasicity and response to augmentation. Popliteal Vein: No evidence of thrombus. Normal compressibility, respiratory phasicity and response to augmentation. Calf Veins: No evidence of thrombus. Normal compressibility and flow on color Doppler imaging. Superficial Great Saphenous Vein: No evidence of thrombus. Normal compressibility and flow on color Doppler imaging. Other Findings:  None. LEFT LOWER EXTREMITY Common Femoral Vein: No evidence of thrombus. Normal compressibility, respiratory phasicity and response to augmentation. Saphenofemoral Junction: No evidence of thrombus. Normal compressibility and flow on color Doppler imaging. Profunda Femoral Vein: No evidence of thrombus. Normal  compressibility and flow on color Doppler imaging. Femoral Vein: No evidence of thrombus. Normal compressibility, respiratory phasicity and response  to augmentation. Popliteal Vein: No evidence of thrombus. Normal compressibility, respiratory phasicity and response to augmentation. Calf Veins: No evidence of thrombus. Normal compressibility and flow on color Doppler imaging. Superficial Great Saphenous Vein: No evidence of thrombus. Normal compressibility and flow on color Doppler imaging. Other Findings:  None. IMPRESSION: No evidence of deep venous thrombosis. Electronically Signed   By: Marcello Moores  Register   On: 12/23/2015 10:05   Dg Chest Port 1 View  Result Date: 12/22/2015 CLINICAL DATA:  Post cardiac pacemaker implantation EXAM: PORTABLE CHEST 1 VIEW COMPARISON:  Portable exam 1031 hours without priors for comparison FINDINGS: LEFT subclavian transvenous pacemaker with leads projecting over RIGHT atrium and RIGHT ventricle. External pacing leads project over chest. Enlargement of cardiac silhouette with pulmonary vascular congestion. Minimal interstitial infiltrates in the perihilar and upper lung regions, question minimal edema. No pleural effusion or pneumothorax. Bones demineralized. IMPRESSION: No pneumothorax following pacemaker insertion. Question minimal pulmonary edema. Electronically Signed   By: Lavonia Dana M.D.   On: 12/22/2015 11:04       Today   Subjective:   Lorraine Boone  patient feels well denies any complaints  Objective:   Blood pressure (!) 144/48, pulse 80, temperature 98.2 F (36.8 C), temperature source Oral, resp. rate 18, height 5\' 3"  (1.6 m), weight 237 lb (107.5 kg), SpO2 95 %.  .  Intake/Output Summary (Last 24 hours) at 12/24/15 1501 Last data filed at 12/24/15 1104  Gross per 24 hour  Intake              480 ml  Output             2000 ml  Net            -1520 ml    Exam VITAL SIGNS: Blood pressure (!) 144/48, pulse 80, temperature 98.2 F (36.8 C),  temperature source Oral, resp. rate 18, height 5\' 3"  (1.6 m), weight 237 lb (107.5 kg), SpO2 95 %.  GENERAL:  65 y.o.-year-old patient lying in the bed with no acute distress.  EYES: Pupils equal, round, reactive to light and accommodation. No scleral icterus. Extraocular muscles intact.  HEENT: Head atraumatic, normocephalic. Oropharynx and nasopharynx clear.  NECK:  Supple, no jugular venous distention. No thyroid enlargement, no tenderness.  LUNGS: Normal breath sounds bilaterally, no wheezing, rales,rhonchi or crepitation. No use of accessory muscles of respiration.  CARDIOVASCULAR: S1, S2 normal. No murmurs, rubs, or gallops.  ABDOMEN: Soft, nontender, nondistended. Bowel sounds present. No organomegaly or mass.  EXTREMITIES: No pedal edema, cyanosis, or clubbing.  NEUROLOGIC: Cranial nerves II through XII are intact. Muscle strength 5/5 in all extremities. Sensation intact. Gait not checked.  PSYCHIATRIC: The patient is alert and oriented x 3.  SKIN: No obvious rash, lesion, or ulcer.   Data Review     CBC w Diff: No results found for: WBC, HGB, HCT, PLT, LYMPHOPCT, BANDSPCT, MONOPCT, EOSPCT, BASOPCT, ATYLYMABS CMP: Lab Results  Component Value Date   NA 139 12/23/2015   K 3.6 12/23/2015   CL 104 12/23/2015   CO2 25 12/23/2015   BUN 25 (H) 12/23/2015   CREATININE 1.35 (H) 12/23/2015  .  Micro Results Recent Results (from the past 240 hour(s))  Surgical pcr screen     Status: None   Collection Time: 12/21/15  8:21 AM  Result Value Ref Range Status   MRSA, PCR NEGATIVE NEGATIVE Final   Staphylococcus aureus NEGATIVE NEGATIVE Final    Comment:  The Xpert SA Assay (FDA approved for NASAL specimens in patients over 65 years of age), is one component of a comprehensive surveillance program.  Test performance has been validated by [facility] for patients greater than or equal to 51 year old. It is not intended to diagnose infection nor to guide or monitor  treatment.         Code Status Orders        Start     Ordered   12/22/15 1452  Full code  Continuous     12/22/15 1451    Code Status History    Date Active Date Inactive Code Status Order ID Comments User Context   This patient has a current code status but no historical code status.          Follow-up Information    Isaias Cowman, MD. Go on 01/03/2016.   Specialty:  Cardiology Why:  Hospital Follow-up: Appointment at 3:45p Contact information: [facility] [place] [address] [phone]        Flora Lipps, MD. Go on 01/05/2016.   Specialties:  Pulmonary Disease, Cardiology Why:  Hospital Follow-up: appointment at 10:00am Contact information: [address] [place] [address] [place] In 7 days.   Why:  PLEASE CALL THE OFFICE MONDAY TO SCHEDULE THIS APPOINTMENT Contact information: [address] [address] [phone]           Discharge Medications     Medication List    TAKE these medications   acetaminophen 325 MG tablet Commonly known as:  TYLENOL Take 650 mg by mouth every 6 (six) hours as needed.   cephALEXin 250 MG capsule Commonly known as:  KEFLEX Take 1 capsule (250 mg total) by mouth 4 (four) times daily.   ergocalciferol 50000 units capsule Commonly known as:  VITAMIN D2 Take 50,000 Units by mouth once a week.   Fluticasone-Salmeterol 250-50 MCG/DOSE Aepb Commonly known as:  ADVAIR DISKUS Inhale 1 puff into the lungs 2 (two) times daily.   furosemide 40 MG tablet Commonly known as:  LASIX Take 1 tablet (40 mg total) by mouth daily. What changed:  medication strength  how much to take   insulin glargine 100 UNIT/ML injection Commonly known as:  LANTUS Inject 50 Units into the skin daily.   ipratropium-albuterol 0.5-2.5 (3) MG/3ML Soln Commonly known as:  DUONEB Take 3 mLs by nebulization every 6 (six)  hours as needed.   Ipratropium-Albuterol 20-100 MCG/ACT Aers respimat Commonly known as:  COMBIVENT RESPIMAT Inhale 1 puff into the lungs every 6 (six) hours.   KOMBIGLYZE XR 2.05-998 MG Tb24 Generic drug:  Saxagliptin-Metformin Take 1 tablet by mouth 2 (two) times daily.   losartan 100 MG tablet Commonly known as:  COZAAR Take 100 mg by mouth daily.   metoprolol tartrate 25 MG tablet Commonly known as:  LOPRESSOR Take 1 tablet (25 mg total) by mouth 2 (two) times daily.   rosuvastatin 10 MG tablet Commonly known as:  CRESTOR Take 10 mg by mouth daily.   tiotropium 18 MCG inhalation capsule Commonly known as:  SPIRIVA HANDIHALER Place 1 capsule (18 mcg total) into inhaler and inhale daily.            Durable Medical Equipment        Start     Ordered   12/24/15 [phone]  For home use only DME Nebulizer machine  Once  Question:  Patient needs a nebulizer to treat with the following condition  Answer:  COPD (chronic obstructive pulmonary disease) ([place])   12/24/15 [id number]   12/23/15 1432  For home use only DME oxygen  Once    Question Answer Comment  Mode or (Route) Nasal cannula   Liters per Minute 2   Frequency Continuous (stationary and portable oxygen unit needed)   Oxygen conserving device Yes   Oxygen delivery system Gas      12/23/15 1431         Total Time in preparing paper work, data evaluation and todays exam - 35 minutes  Dustin Flock M.D on 12/24/2015 at 3:01 [facility]  [place]  [phone]

## 2015-12-24 NOTE — Progress Notes (Signed)
Patient given discharge teaching and paperwork regarding medications, diet, follow-up appointments and activity. Patient understanding verbalized. No complaints at this time. . IV and telemetry discontinued prior to leaving. Skin assessment as previously charted and vitals are stable; on O2 - portable tank. Patient being discharged to home. Caregiver/family present during discharge teaching. No further needs by Care Management. Prescriptions given to patient.

## 2015-12-24 NOTE — Care Management Note (Signed)
Case Management Note  Patient Details  Name: Lorraine Boone MRN: [id number] Date of Birth: October 31, 1950  Subjective/Objective:       Discussed discharge planning with Lorraine Boone. She already has a portable 02 tank at bedside, and she stated understanding to call [facility] today when she arrives at home so they can come and set up her new home oxygen. [facility] will also deliver a home nebulizer machine. A referral for home health RN, PT and OT was faxed to [facility].              Action/Plan:   Expected Discharge Date:                  Expected Discharge Plan:     In-House Referral:     Discharge planning Services     Post Acute Care Choice:    Choice offered to:     DME Arranged:    DME Agency:     HH Arranged:    HH Agency:     Status of Service:     If discussed at [employer] of Stay Meetings, dates discussed:    Additional Comments:  Lorraine Boone A, RN 12/24/2015, 1:03 PM

## 2015-12-24 NOTE — Evaluation (Signed)
Physical Therapy Evaluation Patient Details Name: Lorraine Boone MRN: [id number] DOB: 18-Feb-1950 Today's Date: 12/24/2015   History of Present Illness  65 yo female with onset of pacemaker placement in setting of complete heart block.  PMHx:  HTN, DM, smoker, COPD  Clinical Impression  Pt is up to walk with PT after [facility] transfer, did well and has help with husband at home to continue.  Her plan is to get HHPT to follow up and will continue her therapy hopefully with cardiac rehab after her home services discontinue.  Follow acutely as needed for gait and to progress to stairs.    Follow Up Recommendations (P) Home health PT;Supervision for mobility/OOB    Equipment Recommendations  (P) Cane    Recommendations for Other Services       Precautions / Restrictions Precautions Precautions: Fall;ICD/Pacemaker (LUE movement precautions and NWB) Required Braces or Orthoses:  (sling discontinued) Restrictions Weight Bearing Restrictions: Yes LUE Weight Bearing: Non weight bearing Other Position/Activity Restrictions: no reaching overhead or out to the side with LUE (no lifting with LUE)      Mobility  Bed Mobility Overal bed mobility: Needs Assistance Bed Mobility: Supine to Sit;Sit to Supine     Supine to sit: Min assist Sit to supine: Min assist   General bed mobility comments: trunk support to avoid stressing LUE  Transfers Overall transfer level: Needs assistance Equipment used: 1 person hand held assist Transfers: Sit to/from [facility] Sit to Stand: Min assist Stand pivot transfers: Min assist       General transfer comment: mainly needs help reminding not to use LUE  Ambulation/Gait Ambulation/Gait assistance: Min assist;Min guard Ambulation Distance (Feet): 150 Feet Assistive device: 1 person hand held assist Gait Pattern/deviations: Step-through pattern;Decreased stride length;Trunk flexed;Wide base of support;Decreased weight shift to left Gait  velocity: reduced Gait velocity interpretation: Below normal speed for age/gender General Gait Details: reminders for safety with navigating confined spaces  Stairs            Wheelchair Mobility    Modified Rankin (Stroke Patients Only)       Balance Overall balance assessment: Needs assistance Sitting-balance support: Feet supported Sitting balance-Leahy Scale: Good     Standing balance support: Single extremity supported Standing balance-Leahy Scale: Fair                               Pertinent Vitals/Pain Pain Assessment: No/denies pain    Home Living Family/patient expects to be discharged to:: Private residence Living Arrangements: Spouse/significant other Available Help at Discharge: Family;Available 24 hours/day Type of Home: Mobile home Home Access: Stairs to enter Entrance Stairs-Rails: Can reach both;Left;Right Entrance Stairs-Number of Steps: 4 Home Layout: One level Home Equipment: None      Prior Function Level of Independence: Independent               Hand Dominance   Dominant Hand: Right    Extremity/Trunk Assessment   Upper Extremity Assessment: LUE deficits/detail       LUE Deficits / Details: NWB and movement restrictions temporarily   Lower Extremity Assessment: Generalized weakness      Cervical / Trunk Assessment: Normal  Communication   Communication: No difficulties  Cognition Arousal/Alertness: Awake/alert Behavior During Therapy: WFL for tasks assessed/performed Overall Cognitive Status: Within Functional Limits for tasks assessed  General Comments General comments (skin integrity, edema, etc.): Pt is not wearing her sling, MD has discontinued and she will need reminding to avoid use    Exercises     Assessment/Plan    PT Assessment Patient needs continued PT services  PT Problem List Decreased strength;Decreased range of motion;Decreased activity  tolerance;Decreased mobility;Decreased balance;Decreased coordination;Decreased knowledge of use of DME;Cardiopulmonary status limiting activity;Obesity          PT Treatment Interventions (P) DME instruction;Gait training;Stair training;Functional mobility training;Therapeutic activities;Therapeutic exercise;Neuromuscular re-education;Balance training;Patient/family education    PT Goals (Current goals can be found in the Care Plan section)  Acute Rehab PT Goals Patient Stated Goal: to get to [facility] PT Goal Formulation: With patient/family Time For Goal Achievement: 01/07/16 Potential to Achieve Goals: Good    Frequency (P) Min 2X/week   Barriers to discharge Inaccessible home environment (stairs to enter house)      Co-evaluation               End of Session Equipment Utilized During Treatment: Gait belt;Oxygen Activity Tolerance: Patient tolerated treatment well;Treatment limited secondary to medical complications (Comment) (monitored O2 sats and used tank air to walk) Patient left: in bed;with call bell/phone within reach;with bed alarm set Nurse Communication: Mobility status;Other (comment) (O2 sats were 88% at rest no air and 94% on 2L with gait)         Time: [id number] PT Time Calculation (min) (ACUTE ONLY): 25 min   Charges:   PT Evaluation $PT Eval Moderate Complexity: 1 Procedure PT Treatments $Gait Training: 8-22 mins   PT G Codes:        Ramond Dial January 18, 2016, 12:25 PM  Mee Hives, PT MS Acute Rehab Dept. Number: [place]

## 2016-01-03 DIAGNOSIS — Z950 Presence of cardiac pacemaker: Secondary | ICD-10-CM | POA: Insufficient documentation

## 2016-01-03 DIAGNOSIS — I5031 Acute diastolic (congestive) heart failure: Secondary | ICD-10-CM | POA: Insufficient documentation

## 2016-01-05 ENCOUNTER — Ambulatory Visit (INDEPENDENT_AMBULATORY_CARE_PROVIDER_SITE_OTHER): Payer: Medicare Other | Admitting: Internal Medicine

## 2016-01-05 ENCOUNTER — Encounter: Payer: Self-pay | Admitting: Internal Medicine

## 2016-01-05 VITALS — BP 124/84 | HR 75 | Ht 63.0 in | Wt 225.0 lb

## 2016-01-05 DIAGNOSIS — J449 Chronic obstructive pulmonary disease, unspecified: Secondary | ICD-10-CM

## 2016-01-05 NOTE — Patient Instructions (Signed)
Chronic Obstructive Pulmonary Disease Chronic obstructive pulmonary disease (COPD) is a common lung problem. In COPD, the flow of air from the lungs is limited. The way your lungs work will probably never return to normal, but there are things you can do to improve your lungs and make yourself feel better. Your doctor may treat your condition with:  Medicines.  Oxygen.  Lung surgery.  Changes to your diet.  Rehabilitation. This may involve a team of specialists. Follow these instructions at home:  Take all medicines as told by your doctor.  Avoid medicines or cough syrups that dry up your airway (such as antihistamines) and do not allow you to get rid of thick spit. You do not need to avoid them if told differently by your doctor.  If you smoke, stop. Smoking makes the problem worse.  Avoid being around things that make your breathing worse (like smoke, chemicals, and fumes).  Use oxygen therapy and therapy to help improve your lungs (pulmonary rehabilitation) if told by your doctor. If you need home oxygen therapy, ask your doctor if you should buy a tool to measure your oxygen level (oximeter).  Avoid people who have a sickness you can catch (contagious).  Avoid going outside when it is very hot, cold, or humid.  Eat healthy foods. Eat smaller meals more often. Rest before meals.  Stay active, but remember to also rest.  Make sure to get all the shots (vaccines) your doctor recommends. Ask your doctor if you need a pneumonia shot.  Learn and use tips on how to relax.  Learn and use tips on how to control your breathing as told by your doctor. Try: 1. Breathing in (inhaling) through your nose for 1 second. Then, pucker your lips and breath out (exhale) through your lips for 2 seconds. 2. Putting one hand on your belly (abdomen). Breathe in slowly through your nose for 1 second. Your hand on your belly should move out. Pucker your lips and breathe out slowly through your lips.  Your hand on your belly should move in as you breathe out.  Learn and use controlled coughing to clear thick spit from your lungs. The steps are: 1. Lean your head a little forward. 2. Breathe in deeply. 3. Try to hold your breath for 3 seconds. 4. Keep your mouth slightly open while coughing 2 times. 5. Spit any thick spit out into a tissue. 6. Rest and do the steps again 1 or 2 times as needed. Contact a doctor if:  You cough up more thick spit than usual.  There is a change in the color or thickness of the spit.  It is harder to breathe than usual.  Your breathing is faster than usual. Get help right away if:  You have shortness of breath while resting.  You have shortness of breath that stops you from:  Being able to talk.  Doing normal activities.  You chest hurts for longer than 5 minutes.  Your skin color is more blue than usual.  Your pulse oximeter shows that you have low oxygen for longer than 5 minutes. This information is not intended to replace advice given to you by your health care provider. Make sure you discuss any questions you have with your health care provider. Document Released: 06/27/2007 Document Revised: 06/16/2015 Document Reviewed: 09/04/2012 Elsevier Interactive Patient Education  2017 [employer]  

## 2016-01-05 NOTE — Progress Notes (Signed)
[facility] Consultation     Date: 01/05/2016,   MRN# [id number] Lorraine [facility] 07-12-1950 Code Status:  Code Status History    Date Active Date Inactive Code Status Order ID Comments User Context   12/22/2015  2:51 PM 12/24/2015  4:09 PM Full Code [id number]  Lorraine Cowman, MD Inpatient     Hosp day:@LENGTHOFSTAYDAYS @ Referring MD: @ATDPROV @     PCP:      AdmissionWeight: 225 lb (102.1 kg)                 CurrentWeight: 225 lb (102.1 kg) Lorraine [facility] is a 65 y.o. old female seen in consultation for COPD at the request of Dr. Posey Pronto.   CHIEF COMPLAINT:  follow up COPD  BRIEF PATIENT DESCRIPTION:  This is a 65 yo female s/p pacemaker placement who developed acute hypoxic respiratory failure likely secondary to undiagnosed COPD requiring Bipap post procedure.  SIGNIFICANT EVENTS  11/30-s/p pacemaker placement developed acute respiratory failure likely secondary to undiagnosed COPD requiring Bipap 11/30-PCCM consulted for management of acute respiratory failure requiring Bipap post procedure 11/30-Pt transitioned off Bipap to nasal canula  12/1 d/c cancelled for increased WOB   HISTORY OF PRESENT ILLNESS:   This is a 65 yo female with a PMH of Intermittent Complete Heart Block, HTN, Hyperlipidemia, Type II DM, Bradycardia, Arthritis, and Current everyday smoker (36 yr hx-currently smokes 5 cigarettes per day) -She presented to [facility] 11/30 for elective pacemaker placement.  - Following procedure she developed acute respiratory failure likely secondary to undiagnosed COPD requiring Bipap.   -PCCM consulted 11/30 for additional management.  Patient was d/c home on oxygen and advair and spiriva She feels well today, has chronic SOB and  DOE  No signs of infection at this time     Current Outpatient Prescriptions:  .  acetaminophen (TYLENOL) 325 MG tablet, Take 650 mg by mouth every 6 (six) hours as needed., Disp: , Rfl:  .  cephALEXin (KEFLEX) 250 MG  capsule, Take 1 capsule (250 mg total) by mouth 4 (four) times daily., Disp: 28 capsule, Rfl: 0 .  ergocalciferol (VITAMIN D2) 50000 units capsule, Take 50,000 Units by mouth once a week., Disp: , Rfl:  .  Fluticasone-Salmeterol (ADVAIR DISKUS) 250-50 MCG/DOSE AEPB, Inhale 1 puff into the lungs 2 (two) times daily., Disp: 60 each, Rfl: 0 .  furosemide (LASIX) 40 MG tablet, Take 1 tablet (40 mg total) by mouth daily., Disp: 30 tablet, Rfl: 5 .  insulin glargine (LANTUS) 100 UNIT/ML injection, Inject 50 Units into the skin daily., Disp: , Rfl:  .  Ipratropium-Albuterol (COMBIVENT RESPIMAT) 20-100 MCG/ACT AERS respimat, Inhale 1 puff into the lungs every 6 (six) hours., Disp: 1 Inhaler, Rfl: 0 .  ipratropium-albuterol (DUONEB) 0.5-2.5 (3) MG/3ML SOLN, Take 3 mLs by nebulization every 6 (six) hours as needed., Disp: 360 mL, Rfl: 1 .  losartan (COZAAR) 100 MG tablet, Take 100 mg by mouth daily., Disp: , Rfl:  .  metoprolol tartrate (LOPRESSOR) 25 MG tablet, Take 1 tablet (25 mg total) by mouth 2 (two) times daily., Disp: 50 tablet, Rfl: 0 .  rosuvastatin (CRESTOR) 10 MG tablet, Take 10 mg by mouth daily., Disp: , Rfl:  .  Saxagliptin-Metformin (KOMBIGLYZE XR) 2.05-998 MG TB24, Take 1 tablet by mouth 2 (two) times daily., Disp: , Rfl:  .  tiotropium (SPIRIVA HANDIHALER) 18 MCG inhalation capsule, Place 1 capsule (18 mcg total) into inhaler and inhale daily., Disp: 30 capsule, Rfl: 12   BP 124/84 (BP  Location: Left Arm, Cuff Size: Normal)   Pulse 75   Ht 5\' 3"  (1.6 m)   Wt 225 lb (102.1 kg)   SpO2 99%   BMI 39.86 kg/m    LastLabs   Recent Labs Lab 12/23/15 0343  NA 139  K 3.6  CL 104  CO2 25  BUN 25*  CREATININE 1.35*  GLUCOSE 355*     LastLabs  No results for input(s): HGB, HCT, WBC, PLT in the last 168 hours.    ImagingResults(Last48hours)  Dg Chest 1 View  Result Date: 12/23/2015 CLINICAL DATA:  Shortness of breath following pacemaker insertion EXAM: CHEST 1 VIEW  COMPARISON:  12/22/2015 FINDINGS: Cardiac shadow is stable. Pacing device is again seen and stable. The lungs are well aerated bilaterally. Persistent vascular congestion is noted with interstitial edema new from the prior exam. No sizable effusion is seen. No bony abnormality is noted. IMPRESSION: Increase in the degree of vascular congestion and edema. Electronically Signed   By: Inez Catalina M.D.   On: 12/23/2015 10:29   US Venous Img Lower Bilateral  Result Date: 12/23/2015 CLINICAL DATA:  Swelling. EXAM: BILATERAL LOWER EXTREMITY VENOUS DOPPLER ULTRASOUND TECHNIQUE: Gray-scale sonography with graded compression, as well as color Doppler and duplex ultrasound were performed to evaluate the lower extremity deep venous systems from the level of the common femoral vein and including the common femoral, femoral, profunda femoral, popliteal and calf veins including the posterior tibial, peroneal and gastrocnemius veins when visible. The superficial great saphenous vein was also interrogated. Spectral Doppler was utilized to evaluate flow at rest and with distal augmentation maneuvers in the common femoral, femoral and popliteal veins. COMPARISON:  No recent prior. FINDINGS: RIGHT LOWER EXTREMITY Common Femoral Vein: No evidence of thrombus. Normal compressibility, respiratory phasicity and response to augmentation. Saphenofemoral Junction: No evidence of thrombus. Normal compressibility and flow on color Doppler imaging. Profunda Femoral Vein: No evidence of thrombus. Normal compressibility and flow on color Doppler imaging. Femoral Vein: No evidence of thrombus. Normal compressibility, respiratory phasicity and response to augmentation. Popliteal Vein: No evidence of thrombus. Normal compressibility, respiratory phasicity and response to augmentation. Calf Veins: No evidence of thrombus. Normal compressibility and flow on color Doppler imaging. Superficial Great Saphenous Vein: No evidence of thrombus. Normal  compressibility and flow on color Doppler imaging. Other Findings:  None. LEFT LOWER EXTREMITY Common Femoral Vein: No evidence of thrombus. Normal compressibility, respiratory phasicity and response to augmentation. Saphenofemoral Junction: No evidence of thrombus. Normal compressibility and flow on color Doppler imaging. Profunda Femoral Vein: No evidence of thrombus. Normal compressibility and flow on color Doppler imaging. Femoral Vein: No evidence of thrombus. Normal compressibility, respiratory phasicity and response to augmentation. Popliteal Vein: No evidence of thrombus. Normal compressibility, respiratory phasicity and response to augmentation. Calf Veins: No evidence of thrombus. Normal compressibility and flow on color Doppler imaging. Superficial Great Saphenous Vein: No evidence of thrombus. Normal compressibility and flow on color Doppler imaging. Other Findings:  None. IMPRESSION: No evidence of deep venous thrombosis. Electronically Signed   By: Marcello Moores  Register   On: 12/23/2015 10:05   Dg Chest Port 1 View  Result Date: 12/22/2015 CLINICAL DATA:  Post cardiac pacemaker implantation EXAM: PORTABLE CHEST 1 VIEW COMPARISON:  Portable exam 1031 hours without priors for comparison FINDINGS: LEFT subclavian transvenous pacemaker with leads projecting over RIGHT atrium and RIGHT ventricle. External pacing leads project over chest. Enlargement of cardiac silhouette with pulmonary vascular congestion. Minimal interstitial infiltrates in the perihilar and upper lung  regions, question minimal edema. No pleural effusion or pneumothorax. Bones demineralized. IMPRESSION: No pneumothorax following pacemaker insertion. Question minimal pulmonary edema. Electronically Signed   By: Lavonia Dana M.D.   On: 12/22/2015 11:04         REVIEW OF SYSTEMS   Review of Systems  Constitutional: Negative for chills, diaphoresis, fever, malaise/fatigue and weight loss.  HENT: Negative for congestion and  hearing loss.   Eyes: Negative for blurred vision and double vision.  Respiratory: Positive for shortness of breath. Negative for cough, hemoptysis, sputum production and wheezing.   Cardiovascular: Negative for chest pain, palpitations and orthopnea.  Gastrointestinal: Negative for heartburn, nausea and vomiting.  Skin: Negative for rash.  Neurological: Negative for weakness.  All other systems reviewed and are negative.    VS: BP 124/84 (BP Location: Left Arm, Cuff Size: Normal)   Pulse 75   Ht 5\' 3"  (1.6 m)   Wt 225 lb (102.1 kg)   SpO2 99%   BMI 39.86 kg/m      PHYSICAL EXAM   Physical Exam  Constitutional: She is oriented to person, place, and time. She appears well-developed and well-nourished. No distress.  HENT:  Head: Normocephalic and atraumatic.  Mouth/Throat: No oropharyngeal exudate.  Eyes: No scleral icterus.  Neck: Neck supple.  Cardiovascular: Normal rate, regular rhythm and normal heart sounds.   No murmur heard. Pulmonary/Chest: No stridor. No respiratory distress. She has no wheezes.  Musculoskeletal: Normal range of motion. She exhibits no edema.  Neurological: She is alert and oriented to person, place, and time. No cranial nerve deficit.  Skin: Skin is warm. She is not diaphoretic.  Psychiatric: She has a normal mood and affect.    office Arlyce Harman Ratio 78% fev1 74% predicted Flow volume curve shows slight concavity at end of expiration No desats with ambulating Pulse ox  NO destats  ASSESSMENT/PLAN    65 yo morbidly obese white female with extensive smoking history with signs and symptoms of mild/moderate COPD with Fev1 74% gold stage C  1.continue advair stop spiriva 2.albuterol as needed 3.will assess need for oxygen ONO   Follow up in 3 months  I have personally obtained a history, examined the patient, evaluated laboratory and independently reviewed imaging results, formulated the assessment and plan and placed orders.  The Patient  requires high complexity decision making for assessment and support, frequent evaluation and titration of therapies, application of advanced monitoring technologies and extensive interpretation of multiple databases.  Patient/Family are satisfied with Plan of action and management. All questions answered   Corrin Parker, M.D.  [facility]  Medical Director [place] Director [facility] Cardio-Pulmonary Department

## 2016-02-13 ENCOUNTER — Telehealth: Payer: Self-pay | Admitting: Internal Medicine

## 2016-02-13 NOTE — Telephone Encounter (Signed)
Per [facility] protocol, [facility] requires patients to put a debit/credit card on file. Pt has o2 with [facility] when she was discharged from hospital.  [facility] contacted patient to arrange ONO and was told by [facility] that she would have to have a card on file. Pt states that she does not give out her debit/credit card information and refused.  [facility] unable to arrange test. Since patient already has o2 with [facility], unable to send to another DME.  Pt is aware and stated that she will just not have the test done. [place] to Dr. Mortimer Fries as an Juluis Rainier. Rhonda J Cobb

## 2016-05-03 DIAGNOSIS — E785 Hyperlipidemia, unspecified: Secondary | ICD-10-CM | POA: Insufficient documentation

## 2016-05-15 ENCOUNTER — Ambulatory Visit (INDEPENDENT_AMBULATORY_CARE_PROVIDER_SITE_OTHER): Payer: Medicare Other | Admitting: Internal Medicine

## 2016-05-15 ENCOUNTER — Encounter: Payer: Self-pay | Admitting: Internal Medicine

## 2016-05-15 VITALS — BP 160/100 | HR 113 | Resp 16 | Ht 63.0 in | Wt 232.0 lb

## 2016-05-15 DIAGNOSIS — J9611 Chronic respiratory failure with hypoxia: Secondary | ICD-10-CM | POA: Diagnosis not present

## 2016-05-15 NOTE — Patient Instructions (Signed)
Continue oxygen at night Continue advair as prescribed

## 2016-05-15 NOTE — Progress Notes (Signed)
Lorraine Boone     Date: 05/15/2016,   MRN# [id number] Lorraine Boone 1950/03/24 Code Status:  Code Status History    Date Active Date Inactive Code Status Order ID Comments User Context   12/22/2015  2:51 PM 12/24/2015  4:09 PM Full Code [id number]  Lorraine Cowman, MD Inpatient     Hosp day:@LENGTHOFSTAYDAYS @ Referring MD: @ATDPROV @     PCP:      AdmissionWeight: 232 lb (105.2 kg)                 CurrentWeight: 232 lb (105.2 kg) Lorraine [facility] is a 66 y.o. old female seen in [place] for COPD at the request of Lorraine [facility]one.   CHIEF COMPLAINT:  follow up COPD  BRIEF PATIENT DESCRIPTION:  This is a 66 yo female s/p pacemaker placement who developed acute hypoxic respiratory failure likely secondary to undiagnosed COPD requiring Bipap post procedure.  SIGNIFICANT EVENTS  11/30-s/p pacemaker placement developed acute respiratory failure likely secondary to undiagnosed COPD requiring Bipap 11/30-PCCM consulted for management of acute respiratory failure requiring Bipap post procedure 11/30-Pt transitioned off Bipap to nasal canula  12/1 d/c cancelled for increased WOB  -66 yo female with a PMH of Intermittent Complete Heart Block, HTN, Hyperlipidemia, Type II DM, Bradycardia, Arthritis, and Current everyday smoker (36 yr hx-currently smokes 5 cigarettes per day) -She presented to [facility] 11/30 for elective pacemaker placement.  - Following procedure she developed acute respiratory failure likely secondary to undiagnosed COPD requiring Bipap.   -PCCM consulted 11/30 for additional management.    HPI On chronic oxygen therapy She feels well today, has chronic SOB and  DOE No signs of infection at this time Doing well with oxygen at night     Current Outpatient Prescriptions:  .  acetaminophen (TYLENOL) 325 MG tablet, Take 650 mg by mouth every 6 (six) hours as needed., Disp: , Rfl:  .  ergocalciferol (VITAMIN D2) 50000 units capsule, Take  50,000 Units by mouth once a week., Disp: , Rfl:  .  Fluticasone-Salmeterol (ADVAIR DISKUS) 250-50 MCG/DOSE AEPB, Inhale 1 puff into the lungs 2 (two) times daily., Disp: 60 each, Rfl: 0 .  furosemide (LASIX) 40 MG tablet, Take 1 tablet (40 mg total) by mouth daily., Disp: 30 tablet, Rfl: 5 .  insulin glargine (LANTUS) 100 UNIT/ML injection, Inject 50 Units into the skin daily., Disp: , Rfl:  .  Ipratropium-Albuterol (COMBIVENT RESPIMAT) 20-100 MCG/ACT AERS respimat, Inhale 1 puff into the lungs every 6 (six) hours., Disp: 1 Inhaler, Rfl: 0 .  ipratropium-albuterol (DUONEB) 0.5-2.5 (3) MG/3ML SOLN, Take 3 mLs by nebulization every 6 (six) hours as needed., Disp: 360 mL, Rfl: 1 .  losartan (COZAAR) 100 MG tablet, Take 100 mg by mouth daily., Disp: , Rfl:  .  metoprolol (LOPRESSOR) 50 MG tablet, Take 50 mg by mouth 2 (two) times daily., Disp: , Rfl:  .  rosuvastatin (CRESTOR) 10 MG tablet, Take 10 mg by mouth daily., Disp: , Rfl:  .  Saxagliptin-Metformin (KOMBIGLYZE XR) 2.05-998 MG TB24, Take 1 tablet by mouth 2 (two) times daily., Disp: , Rfl:  .  tiotropium (SPIRIVA HANDIHALER) 18 MCG inhalation capsule, Place 1 capsule (18 mcg total) into inhaler and inhale daily., Disp: 30 capsule, Rfl: 12   BP (!) 160/100 (BP Location: Left Arm, Patient Position: Sitting, Cuff Size: Normal)   Pulse (!) 113   Resp 16   Ht 5\' 3"  (1.6 m)   Wt 232 lb (105.2 kg)   SpO2 98%  BMI 41.10 kg/m    LastLabs   Recent Labs Lab 12/23/15 0343  NA 139  K 3.6  CL 104  CO2 25  BUN 25*  CREATININE 1.35*  GLUCOSE 355*       REVIEW OF SYSTEMS   Review of Systems  Constitutional: Negative for chills, diaphoresis, fever, malaise/fatigue and weight loss.  HENT: Negative for congestion and hearing loss.   Eyes: Negative for blurred vision and double vision.  Respiratory: Positive for shortness of breath. Negative for cough, hemoptysis, sputum production and wheezing.   Cardiovascular: Negative for chest  pain, palpitations and orthopnea.  Gastrointestinal: Negative for heartburn, nausea and vomiting.  Skin: Negative for rash.  Neurological: Negative for weakness.  All other systems reviewed and are negative.    VS: BP (!) 160/100 (BP Location: Left Arm, Patient Position: Sitting, Cuff Size: Normal)   Pulse (!) 113   Resp 16   Ht 5\' 3"  (1.6 m)   Wt 232 lb (105.2 kg)   SpO2 98%   BMI 41.10 kg/m      PHYSICAL EXAM   Physical Exam  Constitutional: She is oriented to person, place, and time. She appears well-developed and well-nourished. No distress.  HENT:  Head: Normocephalic and atraumatic.  Mouth/Throat: No oropharyngeal exudate.  Eyes: No scleral icterus.  Neck: Neck supple.  Cardiovascular: Normal rate, regular rhythm and normal heart sounds.   No murmur heard. Pulmonary/Chest: No stridor. No respiratory distress. She has no wheezes.  Musculoskeletal: Normal range of motion. She exhibits no edema.  Neurological: She is alert and oriented to person, place, and time. No cranial nerve deficit.  Skin: Skin is warm. She is not diaphoretic.  Psychiatric: She has a normal mood and affect.    office [facility] Ratio 78% fev1 74% predicted Flow volume curve shows slight concavity at end of expiration No desats with ambulating Pulse ox  NO destats  ASSESSMENT/PLAN    66 yo morbidly obese white female with extensive smoking history with signs and symptoms of mild/moderate COPD with chronic hypoxic resp failure gold stage C   1.continue advair stop spiriva 2.albuterol as needed 3.continue oxygen at night   Follow up in 6 months  Patient satisfied with Plan of action and management. All questions answered   [facility], M.D.  [facility]  Medical Director [place] Director [facility] Cardio-Pulmonary Department

## 2016-05-17 ENCOUNTER — Telehealth: Payer: Self-pay | Admitting: Internal Medicine

## 2016-05-17 NOTE — Telephone Encounter (Signed)
Insurance company calling stating pt would like [place] to send order on her Oxygen to [facility]  They are asking [place] to fax over : Prescription liter flow  Dx from provider Face to face notes And O2 stats test  Fax : [phone]  Phone [phone]  If we have any further questions please contact patient back

## 2016-05-18 NOTE — Telephone Encounter (Signed)
Received message from [place] with [facility] that she has contacted pt. Called pt and she states she did hear from [place]. Nothing further needed.

## 2016-05-18 NOTE — Telephone Encounter (Signed)
Spoke with a man at [facility] who states he will have to send an email [place] Flakes that called. He states pt needs an order sent to [place] for pt O2 and will need a SMW test. States they will call pt also. States [employer] sent [place] a paper requesting info for O2. Will call [employer] due to not receiving any request.   Hel[place] at [facility] states that they don't have anything on this pt and that she is not in their system. Will call pt to f/u.  Spoke with pt and she states she uses [facility]. She states she received a call from [facility] stating that [facility] is going to be out of network.   LMOM for Melissa with [facility] to call back in regards to this pt.

## 2016-11-22 ENCOUNTER — Encounter: Payer: Self-pay | Admitting: Pulmonary Disease

## 2016-11-22 ENCOUNTER — Ambulatory Visit (INDEPENDENT_AMBULATORY_CARE_PROVIDER_SITE_OTHER): Payer: Medicare Other | Admitting: Pulmonary Disease

## 2016-11-22 VITALS — BP 126/84 | HR 76 | Ht 63.0 in | Wt 246.0 lb

## 2016-11-22 DIAGNOSIS — G4734 Idiopathic sleep related nonobstructive alveolar hypoventilation: Secondary | ICD-10-CM | POA: Diagnosis not present

## 2016-11-22 DIAGNOSIS — R0609 Other forms of dyspnea: Secondary | ICD-10-CM

## 2016-11-22 DIAGNOSIS — Z87891 Personal history of nicotine dependence: Secondary | ICD-10-CM

## 2016-11-22 NOTE — Patient Instructions (Signed)
Continue Advair Continue Combivent as needed You may discontinue Spiriva inhaler New oxygen therapy with sleep We discussed weight loss strategies Follow-up with Dr. Mortimer Fries in 4-6 months with target weight of 230#

## 2016-11-26 NOTE — Progress Notes (Signed)
PULMONARY OFFICE FOLLOW-UP VISIT   CHRONIC PULMONARY PROBLEMS: Exertional dyspnea Obesity Former smoker Nocturnal hypoxemia  SUBJ: This is a patient of Dr. Mortimer Fries.  Last seen 05/15/16.  Routine reevaluation.  Has been diagnosed with "COPD" in the past.  However, spirometry in 12/2015 failed to demonstrate obstruction and suggest mild restriction.  She is maintained on Advair and inhalers which she is using regularly.  She is unable to discern whether this is beneficial or not.  She has Combivent and DuoNeb at home which she rarely uses.  She wears oxygen with sleep reliably.  She has a little day to day or season to season variation in her exertional dyspnea.  She describes mild exertional dyspnea, particularly when walking stairs or inclines. Denies CP, fever, purulent sputum, hemoptysis, LE edema and calf tenderness   OBJ: Vitals:   11/22/16 0950 11/22/16 0951  BP:  126/84  Pulse:  76  SpO2:  99%  Weight: 111.6 kg (246 lb)   Height: 5\' 3"  (1.6 m)     Gen: Obese, NAD HEENT: NCAT, sclerae white, oropharynx normal Neck: No LAN, no JVD noted Lungs: full BS, no adventitious sounds Cardiovascular: Reg, no M noted Abdomen: Centripetal obesity, soft, NT, +BS Ext: no C/C/E Neuro: grossly intact  BMET    Component Value Date/Time   NA 139 12/23/2015 0343   K 3.6 12/23/2015 0343   CL 104 12/23/2015 0343   CO2 25 12/23/2015 0343   GLUCOSE 355 (H) 12/23/2015 0343   BUN 25 (H) 12/23/2015 0343   CREATININE 1.35 (H) 12/23/2015 0343   CALCIUM 8.8 (L) 12/23/2015 0343   GFRNONAA 40 (L) 12/23/2015 0343   GFRAA 47 (L) 12/23/2015 0343    No flowsheet data found.   CXR: No recent film  IMPRESSION: DOE (dyspnea on exertion)  Morbid obesity (HCC)  Former smoker  Nocturnal hypoxemia   Her exertional dyspnea appears to be mostly on the basis of obesity.  It is not clear how much she is benefiting from inhaled bronchodilators.  PLAN:   1) I instructed that she may discontinue Spiriva  inhaler and remain off of this medication if she experiences no worsening in her exertional dyspnea 2) continue Advair inhaler for now 3) continue Combivent as needed 4) continue oxygen therapy with sleep 5) we discussed the importance of weight loss and the impact that her weight is having on her exercise tolerance 6) follow-up with Dr. Mortimer Fries in 4-6 months with a target weight of 230 pounds   I have reviewed this patient's medical problems, current medications and therapies and prior pulmonary office notes in evaluation and formulation of the above assessment and plan   Merton Border, MD PCCM service Mobile [phone] Pager [phone] 11/26/2016 10:56 AM

## 2017-03-06 IMAGING — US US EXTREM LOW VENOUS BILAT
1 series · 13 of 24 positions shown · non-contrast
Comparison: No recent prior.

CLINICAL DATA: Swelling.



[Series 1: us extrem low venous bilat · 0.08mm/px · 13 of 63 slices shown]
[im 1/63]
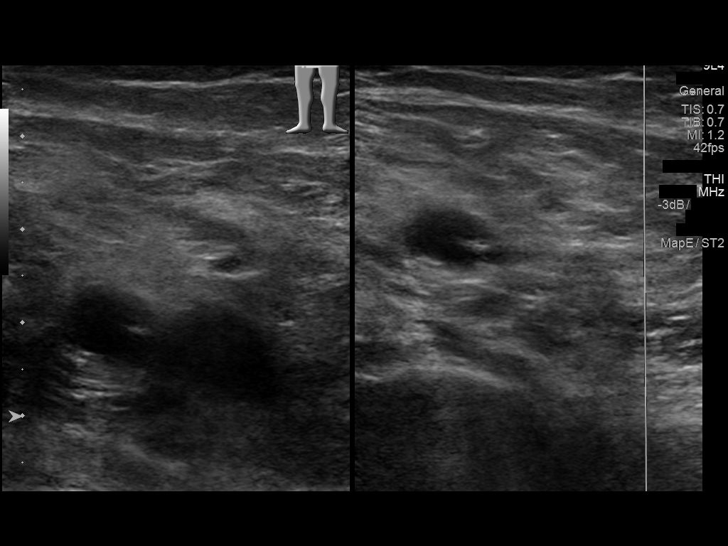
[im 6/63]
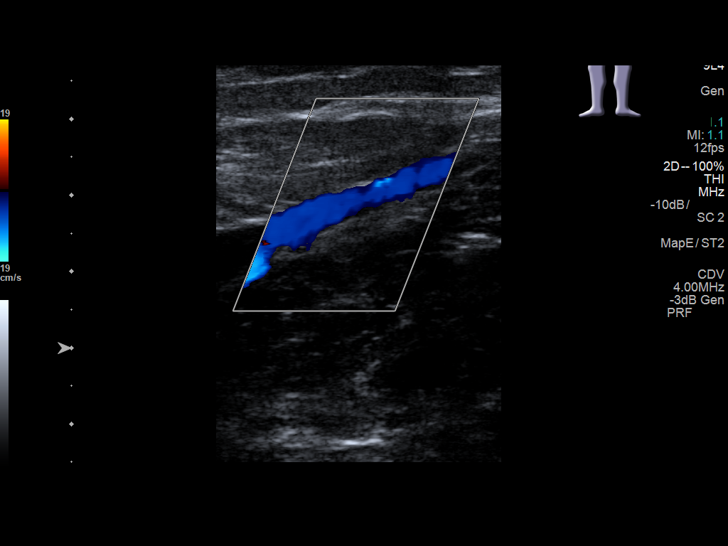
[im 11/63]
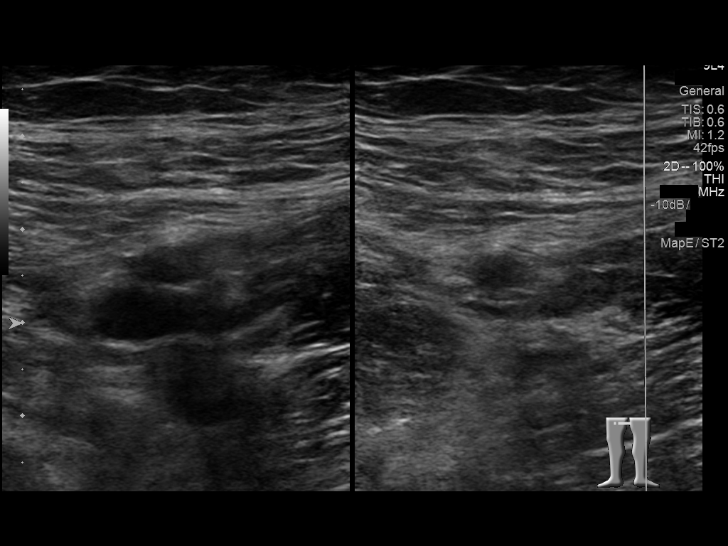
[im 17/63]
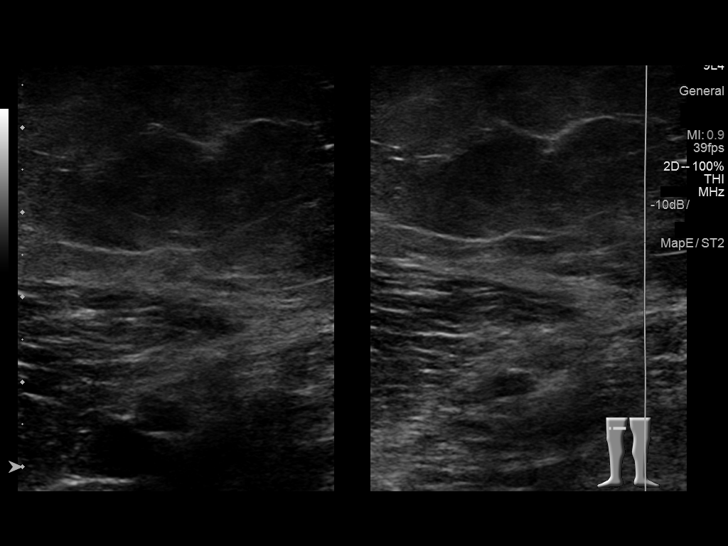
[im 22/63]
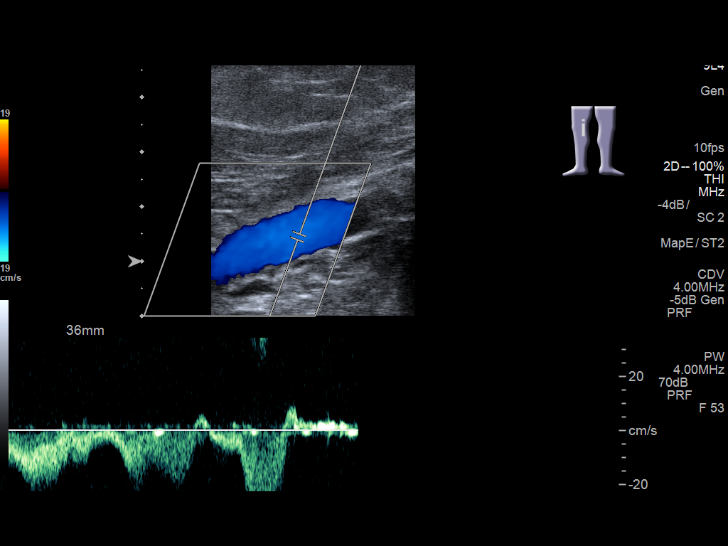
[im 27/63]
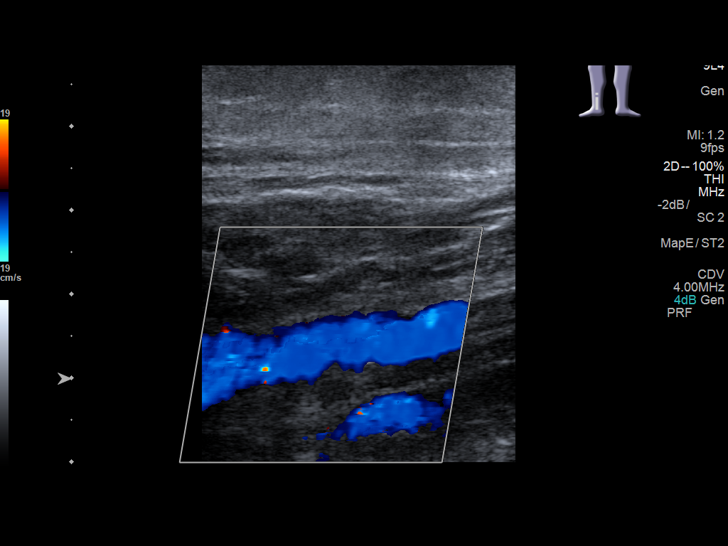
[im 33/63]
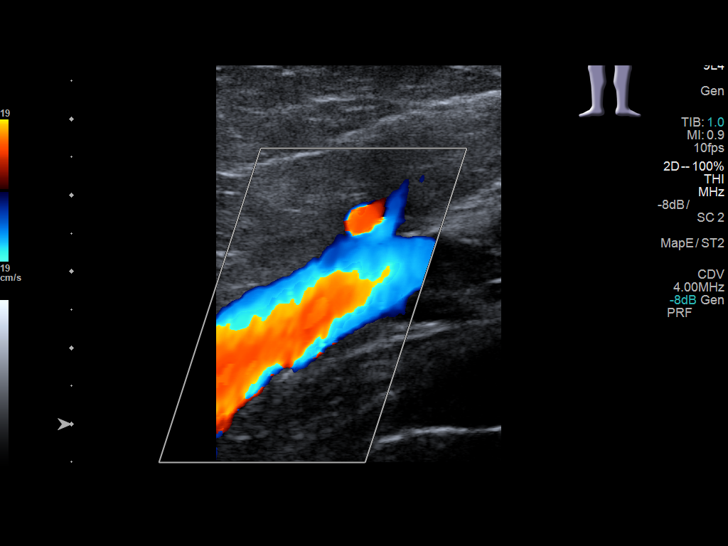
[im 36/63]
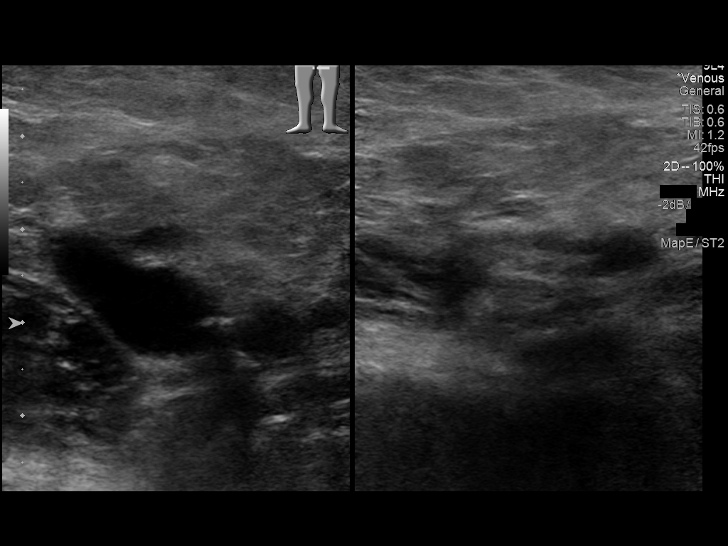
[im 41/63]
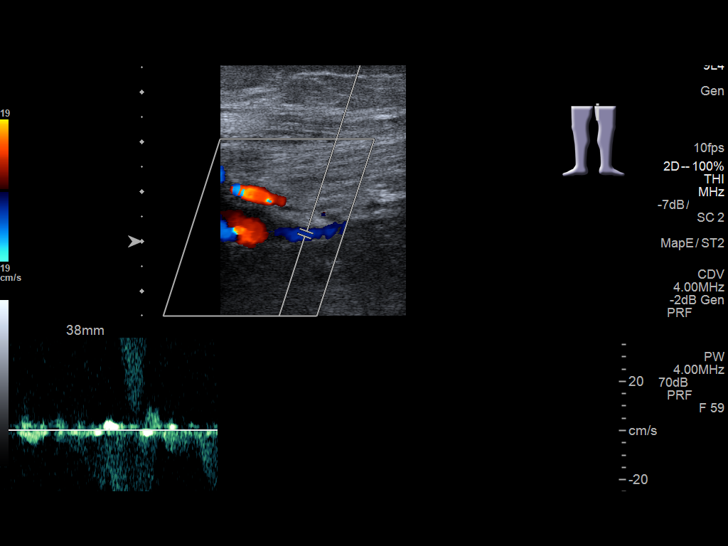
[im 46/63]
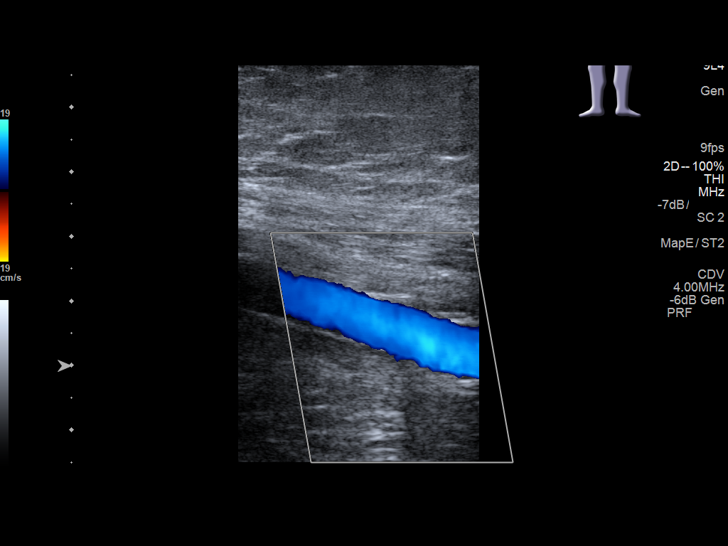
[im 52/63]
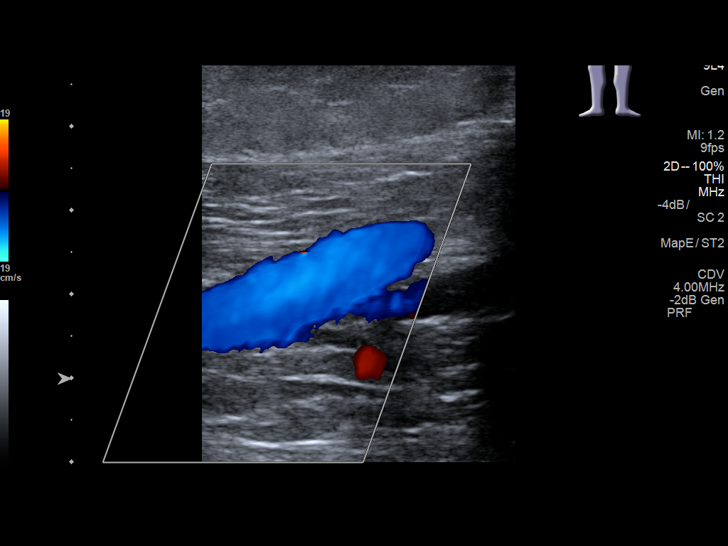
[im 57/63]
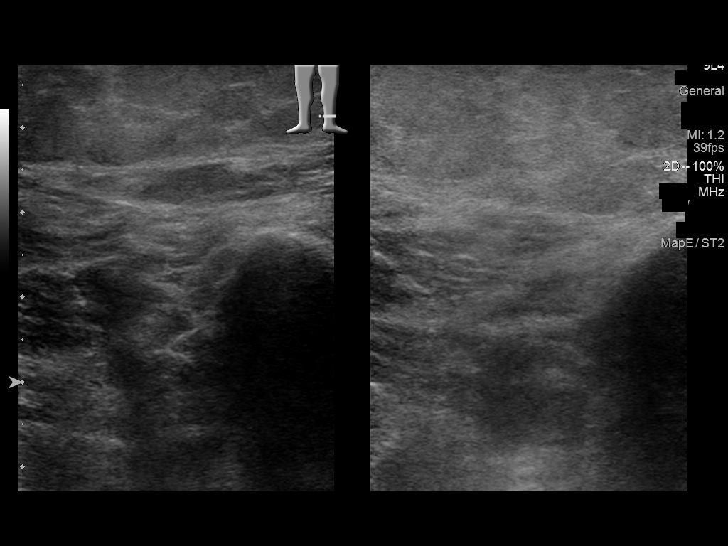
[im 63/63]
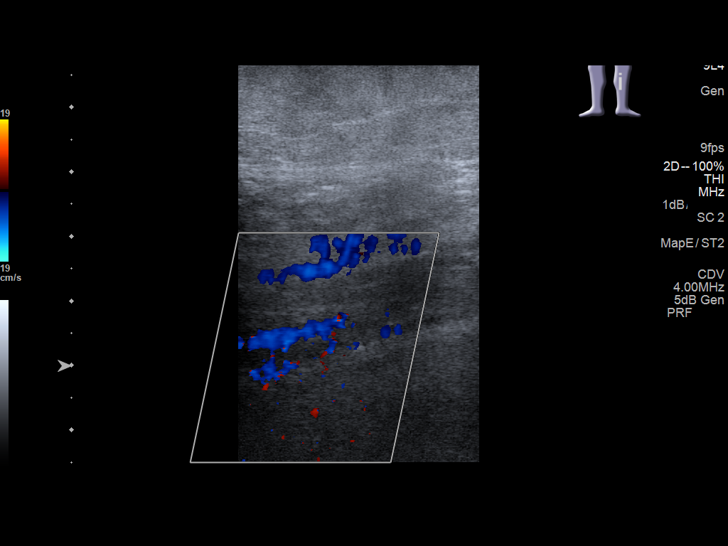

[13 of 24 positions shown; findings below may reference images not displayed]

FINDINGS: RIGHT LOWER EXTREMITY

Common Femoral Vein: No evidence of thrombus. Normal
compressibility, respiratory phasicity and response to augmentation.

Saphenofemoral Junction: No evidence of thrombus. Normal
compressibility and flow on color Doppler imaging.

Profunda Femoral Vein: No evidence of thrombus. Normal
compressibility and flow on color Doppler imaging.

Femoral Vein: No evidence of thrombus. Normal compressibility,
respiratory phasicity and response to augmentation.

Popliteal Vein: No evidence of thrombus. Normal compressibility,
respiratory phasicity and response to augmentation.

Calf Veins: No evidence of thrombus. Normal compressibility and flow
on color Doppler imaging.

Superficial Great Saphenous Vein: No evidence of thrombus. Normal
compressibility and flow on color Doppler imaging.

Other Findings:  None.

LEFT LOWER EXTREMITY

Common Femoral Vein: No evidence of thrombus. Normal
compressibility, respiratory phasicity and response to augmentation.

Saphenofemoral Junction: No evidence of thrombus. Normal
compressibility and flow on color Doppler imaging.

Profunda Femoral Vein: No evidence of thrombus. Normal
compressibility and flow on color Doppler imaging.

Femoral Vein: No evidence of thrombus. Normal compressibility,
respiratory phasicity and response to augmentation.

Popliteal Vein: No evidence of thrombus. Normal compressibility,
respiratory phasicity and response to augmentation.

Calf Veins: No evidence of thrombus. Normal compressibility and flow
on color Doppler imaging.

Superficial Great Saphenous Vein: No evidence of thrombus. Normal
compressibility and flow on color Doppler imaging.

Other Findings:  None.
IMPRESSION: No evidence of deep venous thrombosis.

## 2017-04-19 ENCOUNTER — Encounter: Payer: Self-pay | Admitting: Internal Medicine

## 2017-05-27 ENCOUNTER — Encounter: Payer: Self-pay | Admitting: Internal Medicine

## 2017-05-27 ENCOUNTER — Telehealth: Payer: Self-pay | Admitting: Internal Medicine

## 2017-05-27 ENCOUNTER — Ambulatory Visit: Payer: Medicare Other | Admitting: Internal Medicine

## 2017-05-27 VITALS — BP 132/70 | HR 66 | Ht 63.0 in | Wt 247.0 lb

## 2017-05-27 DIAGNOSIS — J449 Chronic obstructive pulmonary disease, unspecified: Secondary | ICD-10-CM

## 2017-05-27 MED ORDER — FLUTICASONE-SALMETEROL 250-50 MCG/DOSE IN AEPB: 1.0000 | INHALATION_SPRAY | Freq: Two times a day (BID) | RESPIRATORY_TRACT | 11 refills | Status: DC

## 2017-05-27 NOTE — Telephone Encounter (Signed)
°*  STAT* If patient is at the pharmacy, call can be transferred to refill team.   1. Which medications need to be refilled? (please list name of each medication and dose if known) ADVAIR DISKUS 1 puff 2 times daily  2. Which pharmacy/location (including street and city if local pharmacy) is medication to be sent to? [facility] in [place] on [address]   3. Do they need a 30 day or 90 day supply?

## 2017-05-27 NOTE — Telephone Encounter (Signed)
Refill submitted.ss 

## 2017-05-27 NOTE — Patient Instructions (Signed)
USE ALBUTEROL AS NEEDED 2-4 PUFFS every 4-6 hrs as needed CONTINUE WITH ADVAIR  AVOID SECOND HAND SMOKE

## 2017-05-27 NOTE — Addendum Note (Signed)
Addended by: Stephanie Coup on: 05/27/2017 10:04 AM   Modules accepted: Orders

## 2017-05-27 NOTE — Progress Notes (Signed)
[facility] Consultation     Date: 05/27/2017,   MRN# [id number] Rielynn Trulson June 23, 1950     AdmissionWeight: 247 lb (112 kg)                 CurrentWeight: 247 lb (112 kg) Debora Stockdale is a 67 y.o. old female seen in consultation for COPD at the request of Dr. Posey Pronto.   CHIEF COMPLAINT:  follow up COPD  BRIEF PATIENT DESCRIPTION:  This is a 67 yo female s/p pacemaker placement who developed acute hypoxic respiratory failure likely secondary to undiagnosed COPD requiring Bipap post procedure.  SIGNIFICANT EVENTS  11/30-s/p pacemaker placement developed acute respiratory failure likely secondary to undiagnosed COPD requiring Bipap 11/30-PCCM consulted for management of acute respiratory failure requiring Bipap post procedure 11/30-Pt transitioned off Bipap to nasal canula  12/1 d/c cancelled for increased WOB  -66 yo female with a PMH of Intermittent Complete Heart Block, HTN, Hyperlipidemia, Type II DM, Bradycardia, Arthritis, and Current everyday smoker (36 yr hx-currently smokes 5 cigarettes per day) -She presented to [facility] 11/30 for elective pacemaker placement.  - Following procedure she developed acute respiratory failure likely secondary to undiagnosed COPD requiring Bipap.   -PCCM consulted 11/30 for additional management.  HPI On chronic oxygen therapy She feels well today, has chronic SOB and  DOE No signs of infection at this time Doing well with oxygen at night  Uses inhalers as prescribed      Current Outpatient Medications:  .  acetaminophen (TYLENOL) 325 MG tablet, Take 650 mg by mouth every 6 (six) hours as needed., Disp: , Rfl:  .  ergocalciferol (VITAMIN D2) 50000 units capsule, Take 50,000 Units by mouth once a week., Disp: , Rfl:  .  Fluticasone-Salmeterol (ADVAIR DISKUS) 250-50 MCG/DOSE AEPB, Inhale 1 puff into the lungs 2 (two) times daily., Disp: 60 each, Rfl: 0 .  furosemide (LASIX) 40 MG tablet, Take 1 tablet (40 mg total) by  mouth daily., Disp: 30 tablet, Rfl: 5 .  insulin glargine (LANTUS) 100 UNIT/ML injection, Inject 50 Units into the skin daily., Disp: , Rfl:  .  Ipratropium-Albuterol (COMBIVENT RESPIMAT) 20-100 MCG/ACT AERS respimat, Inhale 1 puff into the lungs every 6 (six) hours., Disp: , Rfl:  .  ipratropium-albuterol (DUONEB) 0.5-2.5 (3) MG/3ML SOLN, Take 3 mLs by nebulization every 6 (six) hours as needed., Disp: 360 mL, Rfl: 1 .  losartan (COZAAR) 100 MG tablet, Take 100 mg by mouth daily., Disp: , Rfl:  .  metoprolol (LOPRESSOR) 50 MG tablet, Take 50 mg by mouth 2 (two) times daily., Disp: , Rfl:  .  rosuvastatin (CRESTOR) 10 MG tablet, Take 10 mg by mouth daily., Disp: , Rfl:  .  Saxagliptin-Metformin (KOMBIGLYZE XR) 2.05-998 MG TB24, Take 1 tablet by mouth 2 (two) times daily., Disp: , Rfl:    BP 132/70 (BP Location: Left Arm, Cuff Size: Normal)   Pulse 66   Ht 5\' 3"  (1.6 m)   Wt 247 lb (112 kg)   SpO2 99%   BMI 43.75 kg/m    LastLabs   Recent Labs Lab 12/23/15 0343  NA 139  K 3.6  CL 104  CO2 25  BUN 25*  CREATININE 1.35*  GLUCOSE 355*       REVIEW OF SYSTEMS   Review of Systems  Constitutional: Negative for chills, diaphoresis, fever, malaise/fatigue and weight loss.  HENT: Negative for congestion and hearing loss.   Eyes: Negative for blurred vision and double vision.  Respiratory: Positive for  shortness of breath. Negative for cough, hemoptysis, sputum production and wheezing.   Cardiovascular: Negative for chest pain, palpitations and orthopnea.  Gastrointestinal: Negative for heartburn, nausea and vomiting.  Skin: Negative for rash.  Neurological: Negative for weakness.  All other systems reviewed and are negative.    VS: BP 132/70 (BP Location: Left Arm, Cuff Size: Normal)   Pulse 66   Ht 5\' 3"  (1.6 m)   Wt 247 lb (112 kg)   SpO2 99%   BMI 43.75 kg/m      PHYSICAL EXAM   Physical Exam  Constitutional: She is oriented to person, place, and time. She  appears well-developed and well-nourished. No distress.  HENT:  Head: Normocephalic and atraumatic.  Mouth/Throat: No oropharyngeal exudate.  Eyes: No scleral icterus.  Neck: Neck supple.  Cardiovascular: Normal rate, regular rhythm and normal heart sounds.  No murmur heard. Pulmonary/Chest: No stridor. No respiratory distress. She has no wheezes.  Musculoskeletal: Normal range of motion. She exhibits no edema.  Neurological: She is alert and oriented to person, place, and time. No cranial nerve deficit.  Skin: Skin is warm. She is not diaphoretic.  Psychiatric: She has a normal mood and affect.   office Arlyce Harman Ratio 78% fev1 74% predicted Flow volume curve shows slight concavity at end of expiration No desats with ambulating Pulse ox  NO destats   ASSESSMENT/PLAN    68 yo morbidly obese white female with extensive smoking history with signs and symptoms of mild/moderate COPD with chronic hypoxic resp failure gold stage C   1.continue advair  2.albuterol/Ipratropium  as needed 3.continue oxygen at night as prescribed-patient uses and benefits from usage   Follow up in 6 months  Patient satisfied with Plan of action and management. All questions answered   Corrin Parker, M.D.  [facility]  Medical Director [place] Director [facility] Cardio-Pulmonary Department

## 2017-12-05 ENCOUNTER — Encounter: Payer: Self-pay | Admitting: Internal Medicine

## 2017-12-05 ENCOUNTER — Ambulatory Visit: Payer: Medicare Other | Admitting: Internal Medicine

## 2017-12-05 VITALS — BP 124/84 | HR 67 | Ht 63.0 in | Wt 254.4 lb

## 2017-12-05 DIAGNOSIS — J9611 Chronic respiratory failure with hypoxia: Secondary | ICD-10-CM

## 2017-12-05 DIAGNOSIS — J449 Chronic obstructive pulmonary disease, unspecified: Secondary | ICD-10-CM | POA: Diagnosis not present

## 2017-12-05 NOTE — Progress Notes (Signed)
[facility] Consultation     Date: 12/05/2017,   MRN# [id number] Lorraine Boone 1950/07/06     AdmissionWeight: 254 lb 6.4 oz (115.4 kg)                 CurrentWeight: 254 lb 6.4 oz (115.4 kg) Lorraine Boone is a 67 y.o. old female seen in consultation for COPD at the request of Dr. Posey Pronto.   CHIEF COMPLAINT:  follow up COPD  BRIEF PATIENT DESCRIPTION:  This is a 67 yo female s/p pacemaker placement who developed acute hypoxic respiratory failure likely secondary to undiagnosed COPD requiring Bipap post procedure.  SIGNIFICANT EVENTS  11/30-s/p pacemaker placement developed acute respiratory failure likely secondary to undiagnosed COPD requiring Bipap 11/30-PCCM consulted for management of acute respiratory failure requiring Bipap post procedure 11/30-Pt transitioned off Bipap to nasal canula  12/1 d/c cancelled for increased WOB  -67 yo female with a PMH of Intermittent Complete Heart Block, HTN, Hyperlipidemia, Type II DM, Bradycardia, Arthritis, and Current everyday smoker (36 yr hx-currently smokes 5 cigarettes per day) -She presented to [facility] 11/30 for elective pacemaker placement.  - Following procedure she developed acute respiratory failure likely secondary to undiagnosed COPD requiring Bipap.   -PCCM consulted 11/30 for additional management.  HPI  On oxygen at night No acute issues  COPD stable  No acute exacerbation No need for steroids  Quit tobacco 2 years ago     Current Outpatient Medications:  .  acetaminophen (TYLENOL) 325 MG tablet, Take 650 mg by mouth every 6 (six) hours as needed., Disp: , Rfl:  .  Fluticasone-Salmeterol (WIXELA INHUB) 250-50 MCG/DOSE AEPB, Inhale 1 puff into the lungs 2 (two) times daily., Disp: , Rfl:  .  furosemide (LASIX) 40 MG tablet, Take 1 tablet (40 mg total) by mouth daily., Disp: 30 tablet, Rfl: 5 .  insulin glargine (LANTUS) 100 UNIT/ML injection, Inject 50 Units into the skin daily., Disp: , Rfl:  .   Ipratropium-Albuterol (COMBIVENT RESPIMAT) 20-100 MCG/ACT AERS respimat, Inhale 1 puff into the lungs every 6 (six) hours., Disp: , Rfl:  .  losartan (COZAAR) 100 MG tablet, Take 100 mg by mouth daily., Disp: , Rfl:  .  metoprolol (LOPRESSOR) 50 MG tablet, Take 50 mg by mouth 2 (two) times daily., Disp: , Rfl:  .  rosuvastatin (CRESTOR) 10 MG tablet, Take 10 mg by mouth daily., Disp: , Rfl:  .  Saxagliptin-Metformin (KOMBIGLYZE XR) 2.05-998 MG TB24, Take 1 tablet by mouth 2 (two) times daily., Disp: , Rfl:  .  ergocalciferol (VITAMIN D2) 50000 units capsule, Take 50,000 Units by mouth once a week., Disp: , Rfl:  .  Fluticasone-Salmeterol (ADVAIR DISKUS) 250-50 MCG/DOSE AEPB, Inhale 1 puff into the lungs 2 (two) times daily. (Patient not taking: Reported on 12/05/2017), Disp: 60 each, Rfl: 11 .  ipratropium-albuterol (DUONEB) 0.5-2.5 (3) MG/3ML SOLN, Take 3 mLs by nebulization every 6 (six) hours as needed. (Patient not taking: Reported on 12/05/2017), Disp: 360 mL, Rfl: 1   BP 124/84 (BP Location: Left Arm, Cuff Size: Normal)   Pulse 67   Ht 5\' 3"  (1.6 m)   Wt 254 lb 6.4 oz (115.4 kg)   SpO2 98%   BMI 45.06 kg/m    LastLabs   Recent Labs Lab 12/23/15 0343  NA 139  K 3.6  CL 104  CO2 25  BUN 25*  CREATININE 1.35*  GLUCOSE 355*       REVIEW OF SYSTEMS   Review of Systems  Constitutional: Negative for chills, diaphoresis, fever, malaise/fatigue and weight loss.  HENT: Negative for congestion and hearing loss.   Eyes: Negative for blurred vision and double vision.  Respiratory: Positive for shortness of breath. Negative for cough, hemoptysis, sputum production and wheezing.   Cardiovascular: Negative for chest pain, palpitations and orthopnea.  Gastrointestinal: Negative for heartburn, nausea and vomiting.  Skin: Negative for rash.  Neurological: Negative for weakness.  All other systems reviewed and are negative.    VS: BP 124/84 (BP Location: Left Arm, Cuff Size:  Normal)   Pulse 67   Ht 5\' 3"  (1.6 m)   Wt 254 lb 6.4 oz (115.4 kg)   SpO2 98%   BMI 45.06 kg/m      PHYSICAL EXAM   Physical Exam  Constitutional: She is oriented to person, place, and time. She appears well-developed and well-nourished. No distress.  HENT:  Head: Normocephalic and atraumatic.  Mouth/Throat: No oropharyngeal exudate.  Eyes: No scleral icterus.  Neck: Neck supple.  Cardiovascular: Normal rate, regular rhythm and normal heart sounds.  No murmur heard. Pulmonary/Chest: No stridor. No respiratory distress. She has no wheezes.  Musculoskeletal: Normal range of motion. She exhibits no edema.  Neurological: She is alert and oriented to person, place, and time. No cranial nerve deficit.  Skin: Skin is warm. She is not diaphoretic.  Psychiatric: She has a normal mood and affect.   office Arlyce Harman Ratio 78% fev1 74% predicted Flow volume curve shows slight concavity at end of expiration No desats with ambulating Pulse ox  NO destats   ASSESSMENT/PLAN   67 yo  Morbidly obese white female with extensive smoking history with mild/moderate COPD GOLD STAGE B with chronic hypoxic resp failure on oxygen therapy  1.continue inhalers as prescribed WIXELA, albuterol as needed  2.continue oxygen as prescribed Patient uses and benefits from oxygen therapy   Patient satisfied with Plan of action and management. All questions answered Follow up in 1 year  Daija Routson Patricia Pesa, M.D.  [facility]  Medical Director [place] Director [facility] Cardio-Pulmonary Department

## 2017-12-05 NOTE — Patient Instructions (Signed)
Continue inhalers as prescribed  Continue oxygen at night

## 2018-05-26 ENCOUNTER — Encounter: Payer: Self-pay | Admitting: *Deleted

## 2018-05-26 ENCOUNTER — Encounter: Payer: Self-pay | Admitting: Internal Medicine

## 2018-07-14 ENCOUNTER — Other Ambulatory Visit: Payer: Self-pay

## 2018-07-14 ENCOUNTER — Encounter (INDEPENDENT_AMBULATORY_CARE_PROVIDER_SITE_OTHER): Payer: Self-pay

## 2018-07-14 ENCOUNTER — Ambulatory Visit: Payer: Medicare Other | Admitting: Gastroenterology

## 2018-07-14 VITALS — BP 168/97 | HR 72 | Temp 98.1°F | Wt 253.6 lb

## 2018-07-14 DIAGNOSIS — E8881 Metabolic syndrome: Secondary | ICD-10-CM | POA: Diagnosis not present

## 2018-07-14 DIAGNOSIS — D509 Iron deficiency anemia, unspecified: Secondary | ICD-10-CM

## 2018-07-14 DIAGNOSIS — R195 Other fecal abnormalities: Secondary | ICD-10-CM

## 2018-07-14 NOTE — Progress Notes (Signed)
Lorraine Darby, MD [address]  [place]  [place], [place] [address]  Main: [phone]  Fax: [phone]    Gastroenterology Consultation  Referring Provider:     Casilda Carls, MD Primary Care Physician:  Casilda Carls, MD Primary Gastroenterologist:  Dr. Cephas Boone Reason for Consultation:     Microcytic anemia        HPI:   Lorraine Boone is a 68 y.o. female referred by Dr. Casilda Carls, MD  for consultation & management of microcytic anemia, new onset.  Patient has history of metabolic syndrome, complete heart block, status post dual-chamber pacemaker is referred for evaluation of new onset of microcytic anemia.  Patient had labs from outside in 04/2018 which revealed hemoglobin 9.1, MCV 78.5, normal platelets.  She is also FIT positive in 02/2017.  Her hemoglobin A1c 8.3, she is taking oral iron 1 pill a day, started 2 weeks ago.  She is accompanied by her husband today.  Patient denies any GI symptoms today She does not have history of heavy smoking or alcohol use  NSAIDs: None  Antiplts/Anticoagulants/Anti thrombotics: None  GI Procedures: None She denies family history of GI malignancy  Past Medical History:  Diagnosis Date  . Arthritis   . Bradycardia   . Diabetes mellitus type 2, uncomplicated ([place])   . Hyperlipidemia   . Hypertension   . Intermittent complete heart block [facility])     Past Surgical History:  Procedure Laterality Date  . CHOLECYSTECTOMY    . ESSURE TUBAL LIGATION    . PACEMAKER INSERTION N/A 12/22/2015   Procedure: INSERTION PACEMAKER;  Surgeon: Isaias Cowman, MD;  Location: [facility] ORS;  Service: Cardiovascular;  Laterality: N/A;    Current Outpatient Medications:  .  acetaminophen (TYLENOL) 325 MG tablet, Take 650 mg by mouth every 6 (six) hours as needed., Disp: , Rfl:  .  ergocalciferol (VITAMIN D2) 50000 units capsule, Take 50,000 Units by mouth once a week., Disp: , Rfl:  .  Fluticasone-Salmeterol (ADVAIR DISKUS) 250-50  MCG/DOSE AEPB, Inhale 1 puff into the lungs 2 (two) times daily., Disp: 60 each, Rfl: 11 .  Fluticasone-Salmeterol (WIXELA INHUB) 250-50 MCG/DOSE AEPB, Inhale 1 puff into the lungs 2 (two) times daily., Disp: , Rfl:  .  furosemide (LASIX) 40 MG tablet, Take 1 tablet (40 mg total) by mouth daily., Disp: 30 tablet, Rfl: 5 .  insulin glargine (LANTUS) 100 UNIT/ML injection, Inject 50 Units into the skin daily., Disp: , Rfl:  .  Ipratropium-Albuterol (COMBIVENT RESPIMAT) 20-100 MCG/ACT AERS respimat, Inhale 1 puff into the lungs every 6 (six) hours., Disp: , Rfl:  .  ipratropium-albuterol (DUONEB) 0.5-2.5 (3) MG/3ML SOLN, Take 3 mLs by nebulization every 6 (six) hours as needed., Disp: 360 mL, Rfl: 1 .  JANUMET XR 50-1000 MG TB24, TK 1 T PO BID, Disp: , Rfl:  .  losartan (COZAAR) 100 MG tablet, Take 100 mg by mouth daily., Disp: , Rfl:  .  metoprolol (LOPRESSOR) 50 MG tablet, Take 50 mg by mouth 2 (two) times daily., Disp: , Rfl:  .  rosuvastatin (CRESTOR) 10 MG tablet, Take 10 mg by mouth daily., Disp: , Rfl:  .  Saxagliptin-Metformin (KOMBIGLYZE XR) 2.05-998 MG TB24, Take 1 tablet by mouth 2 (two) times daily., Disp: , Rfl:     Family History  Problem Relation Age of Onset  . Diabetes Mother   . Heart disease Mother      Social History   Tobacco Use  . Smoking status:  Former Smoker    Packs/day: 0.25    Years: 35.00    Pack years: 8.75    Types: Cigarettes    Quit date: 12/21/2015    Years since quitting: 2.5  . Smokeless tobacco: Never Used  Substance Use Topics  . Alcohol use: No  . Drug use: No    Allergies as of 07/14/2018  . (No Known Allergies)    Review of Systems:    All systems reviewed and negative except where noted in HPI.   Physical Exam:  BP (!) 168/97   Pulse 72   Temp 98.1 F (36.7 C)   Wt 253 lb 9.6 oz (115 kg)   BMI 44.92 kg/m  No LMP recorded. Patient is postmenopausal.  General:   Alert,  Well-developed, well-nourished, pleasant and cooperative  in NAD Head:  Normocephalic and atraumatic. Eyes:  Sclera clear, no icterus.   Conjunctiva pink. Ears:  Normal auditory acuity. Nose:  No deformity, discharge, or lesions. Mouth:  No deformity or lesions,oropharynx pink & moist. Neck:  Supple; no masses or thyromegaly. Lungs:  Respirations even and unlabored.  Clear throughout to auscultation.   No wheezes, crackles, or rhonchi. No acute distress. Heart:  Regular rate and rhythm; no murmurs, clicks, rubs, or gallops. Abdomen:  Normal bowel sounds. Soft, obese, non-tender and non-distended without masses, hepatosplenomegaly or hernias noted.  No guarding or rebound tenderness.   Rectal: Not performed Msk:  Symmetrical without gross deformities. Good, equal movement & strength bilaterally. Pulses:  Normal pulses noted. Extremities:  No clubbing or edema.  No cyanosis. Neurologic:  Alert and oriented x3;  grossly normal neurologically. Skin:  Intact without significant lesions or rashes. No jaundice. Psych:  Alert and cooperative. Normal mood and affect.  Imaging Studies: None  Assessment and Plan:   [name] is a 68 y.o. female with metabolic syndrome, seen in consultation for new onset of microcytic anemia, FIT positive  Microcytic anemia and FIT positive Recommend EGD and colonoscopy Check CBC, iron studies, B12 and folate levels Continue oral iron daily  Metabolic syndrome Check LFTs Counseled her about management of diabetes and hyperlipidemia, she is at risk for fatty liver  Follow up in 3 months   Lorraine Darby, MD

## 2018-07-15 LAB — VITAMIN B12: Vitamin B-12: 125 pg/mL — ABNORMAL LOW (ref 232–1245)

## 2018-07-15 LAB — HEPATIC FUNCTION PANEL
ALT: 18 IU/L (ref 0–32)
AST: 44 IU/L — ABNORMAL HIGH (ref 0–40)
Albumin: 4 g/dL (ref 3.8–4.8)
Alkaline Phosphatase: 85 IU/L (ref 39–117)
Bilirubin Total: 0.3 mg/dL (ref 0.0–1.2)
Bilirubin, Direct: 0.13 mg/dL (ref 0.00–0.40)
Total Protein: 6.4 g/dL (ref 6.0–8.5)

## 2018-07-15 LAB — CBC
Hematocrit: 29.2 % — ABNORMAL LOW (ref 34.0–46.6)
Hemoglobin: 8.8 g/dL — ABNORMAL LOW (ref 11.1–15.9)
MCH: 23 pg — ABNORMAL LOW (ref 26.6–33.0)
MCHC: 30.1 g/dL — ABNORMAL LOW (ref 31.5–35.7)
MCV: 76 fL — ABNORMAL LOW (ref 79–97)
Platelets: 337 10*3/uL (ref 150–450)
RBC: 3.82 x10E6/uL (ref 3.77–5.28)
RDW: 16.4 % — ABNORMAL HIGH (ref 11.7–15.4)
WBC: 8.7 10*3/uL (ref 3.4–10.8)

## 2018-07-15 LAB — IRON AND TIBC
Iron Saturation: 6 % — CL (ref 15–55)
Iron: 26 ug/dL — ABNORMAL LOW (ref 27–139)
Total Iron Binding Capacity: 445 ug/dL (ref 250–450)
UIBC: 419 ug/dL — ABNORMAL HIGH (ref 118–369)

## 2018-07-15 LAB — FERRITIN: Ferritin: 11 ng/mL — ABNORMAL LOW (ref 15–150)

## 2018-07-15 LAB — FOLATE: Folate: 9.5 ng/mL (ref >3.0)

## 2018-07-17 ENCOUNTER — Other Ambulatory Visit: Payer: Self-pay

## 2018-07-17 DIAGNOSIS — D509 Iron deficiency anemia, unspecified: Secondary | ICD-10-CM

## 2018-07-19 ENCOUNTER — Other Ambulatory Visit: Payer: Self-pay | Admitting: Internal Medicine

## 2018-08-07 ENCOUNTER — Other Ambulatory Visit
Admission: RE | Admit: 2018-08-07 | Discharge: 2018-08-07 | Disposition: A | Discharge status: Home / Self Care | Payer: Medicare Other | Source: Ambulatory Visit | Attending: Gastroenterology | Admitting: Gastroenterology

## 2018-08-07 ENCOUNTER — Other Ambulatory Visit: Payer: Self-pay

## 2018-08-07 DIAGNOSIS — Z1159 Encounter for screening for other viral diseases: Secondary | ICD-10-CM | POA: Diagnosis present

## 2018-08-07 LAB — SARS CORONAVIRUS 2 (TAT 6-24 HRS): SARS Coronavirus 2: NEGATIVE

## 2018-08-11 ENCOUNTER — Other Ambulatory Visit: Payer: Self-pay | Admitting: Gastroenterology

## 2018-08-11 ENCOUNTER — Ambulatory Visit: Payer: Medicare Other | Admitting: Certified Registered Nurse Anesthetist

## 2018-08-11 ENCOUNTER — Encounter
Admission: RE | Disposition: A | Discharge status: Home / Self Care | Payer: Self-pay | Source: Home / Self Care | Attending: Gastroenterology

## 2018-08-11 ENCOUNTER — Encounter: Payer: Self-pay | Admitting: *Deleted

## 2018-08-11 ENCOUNTER — Other Ambulatory Visit: Payer: Self-pay

## 2018-08-11 ENCOUNTER — Ambulatory Visit
Admission: RE | Admit: 2018-08-11 | Discharge: 2018-08-11 | Disposition: A | Discharge status: Home / Self Care | Payer: Medicare Other | Attending: Gastroenterology | Admitting: Gastroenterology

## 2018-08-11 DIAGNOSIS — K3189 Other diseases of stomach and duodenum: Secondary | ICD-10-CM

## 2018-08-11 DIAGNOSIS — Z87891 Personal history of nicotine dependence: Secondary | ICD-10-CM | POA: Diagnosis not present

## 2018-08-11 DIAGNOSIS — R195 Other fecal abnormalities: Secondary | ICD-10-CM

## 2018-08-11 DIAGNOSIS — Z950 Presence of cardiac pacemaker: Secondary | ICD-10-CM | POA: Diagnosis not present

## 2018-08-11 DIAGNOSIS — Z6841 Body Mass Index (BMI) 40.0 and over, adult: Secondary | ICD-10-CM | POA: Insufficient documentation

## 2018-08-11 DIAGNOSIS — D122 Benign neoplasm of ascending colon: Secondary | ICD-10-CM | POA: Insufficient documentation

## 2018-08-11 DIAGNOSIS — K2950 Unspecified chronic gastritis without bleeding: Secondary | ICD-10-CM | POA: Insufficient documentation

## 2018-08-11 DIAGNOSIS — E785 Hyperlipidemia, unspecified: Secondary | ICD-10-CM | POA: Diagnosis not present

## 2018-08-11 DIAGNOSIS — I10 Essential (primary) hypertension: Secondary | ICD-10-CM | POA: Diagnosis not present

## 2018-08-11 DIAGNOSIS — K635 Polyp of colon: Secondary | ICD-10-CM

## 2018-08-11 DIAGNOSIS — Z7951 Long term (current) use of inhaled steroids: Secondary | ICD-10-CM | POA: Insufficient documentation

## 2018-08-11 DIAGNOSIS — J449 Chronic obstructive pulmonary disease, unspecified: Secondary | ICD-10-CM | POA: Insufficient documentation

## 2018-08-11 DIAGNOSIS — D123 Benign neoplasm of transverse colon: Secondary | ICD-10-CM | POA: Insufficient documentation

## 2018-08-11 DIAGNOSIS — D500 Iron deficiency anemia secondary to blood loss (chronic): Secondary | ICD-10-CM

## 2018-08-11 DIAGNOSIS — Z79899 Other long term (current) drug therapy: Secondary | ICD-10-CM | POA: Diagnosis not present

## 2018-08-11 DIAGNOSIS — Z794 Long term (current) use of insulin: Secondary | ICD-10-CM | POA: Insufficient documentation

## 2018-08-11 DIAGNOSIS — K5730 Diverticulosis of large intestine without perforation or abscess without bleeding: Secondary | ICD-10-CM | POA: Insufficient documentation

## 2018-08-11 DIAGNOSIS — D519 Vitamin B12 deficiency anemia, unspecified: Secondary | ICD-10-CM

## 2018-08-11 DIAGNOSIS — D509 Iron deficiency anemia, unspecified: Secondary | ICD-10-CM | POA: Diagnosis present

## 2018-08-11 DIAGNOSIS — E119 Type 2 diabetes mellitus without complications: Secondary | ICD-10-CM | POA: Diagnosis not present

## 2018-08-11 HISTORY — PX: COLONOSCOPY WITH PROPOFOL: SHX5780

## 2018-08-11 HISTORY — DX: Dyspnea, unspecified: R06.00

## 2018-08-11 HISTORY — PX: ESOPHAGOGASTRODUODENOSCOPY (EGD) WITH PROPOFOL: SHX5813

## 2018-08-11 HISTORY — DX: Chronic obstructive pulmonary disease, unspecified: J44.9

## 2018-08-11 LAB — CBC
HCT: 27.5 % — ABNORMAL LOW (ref 36.0–46.0)
Hemoglobin: 8.3 g/dL — ABNORMAL LOW (ref 12.0–15.0)
MCH: 23.4 pg — ABNORMAL LOW (ref 26.0–34.0)
MCHC: 30.2 g/dL (ref 30.0–36.0)
MCV: 77.7 fL — ABNORMAL LOW (ref 80.0–100.0)
Platelets: 266 10*3/uL (ref 150–400)
RBC: 3.54 MIL/uL — ABNORMAL LOW (ref 3.87–5.11)
RDW: 17.4 % — ABNORMAL HIGH (ref 11.5–15.5)
WBC: 6.9 10*3/uL (ref 4.0–10.5)
nRBC: 0 % (ref 0.0–0.2)

## 2018-08-11 LAB — GLUCOSE, CAPILLARY: Glucose-Capillary: 218 mg/dL — ABNORMAL HIGH (ref 70–99)

## 2018-08-11 LAB — IRON AND TIBC
Iron: 21 ug/dL — ABNORMAL LOW (ref 28–170)
Saturation Ratios: 5 % — ABNORMAL LOW (ref 10.4–31.8)
TIBC: 454 ug/dL — ABNORMAL HIGH (ref 250–450)
UIBC: 433 ug/dL

## 2018-08-11 LAB — FERRITIN: Ferritin: 8 ng/mL — ABNORMAL LOW (ref 11–307)

## 2018-08-11 SURGERY — COLONOSCOPY WITH PROPOFOL
Anesthesia: General

## 2018-08-11 MED ORDER — PROPOFOL 500 MG/50ML IV EMUL
INTRAVENOUS | Status: DC | PRN
  Administered 2018-08-11: 80 ug/kg/min via INTRAVENOUS

## 2018-08-11 MED ORDER — SODIUM CHLORIDE 0.9 % IV SOLN
INTRAVENOUS | Status: DC
  Administered 2018-08-11: 1000 mL via INTRAVENOUS

## 2018-08-11 MED ORDER — PROPOFOL 10 MG/ML IV BOLUS
INTRAVENOUS | Status: DC | PRN
  Administered 2018-08-11: 30 mg via INTRAVENOUS
  Administered 2018-08-11: 70 mg via INTRAVENOUS
  Administered 2018-08-11 (×2): 40 mg via INTRAVENOUS

## 2018-08-11 MED ORDER — CYANOCOBALAMIN 1000 MCG/ML IJ SOLN: 1000.0000 ug | INTRAMUSCULAR | 0 refills | Status: AC

## 2018-08-11 MED ORDER — LIDOCAINE HCL (CARDIAC) PF 100 MG/5ML IV SOSY
PREFILLED_SYRINGE | INTRAVENOUS | Status: DC | PRN
  Administered 2018-08-11: 100 mg via INTRAVENOUS

## 2018-08-11 MED ORDER — PROPOFOL 10 MG/ML IV BOLUS
INTRAVENOUS | Status: AC
  Filled 2018-08-11: qty 80

## 2018-08-11 NOTE — Anesthesia Preprocedure Evaluation (Addendum)
Anesthesia Evaluation  Patient identified by MRN, date of birth, ID band Patient awake    Reviewed: Allergy & Precautions, H&P , NPO status , Patient's Chart, lab work & pertinent test results  Airway Mallampati: III  TM Distance: >3 FB Neck ROM: full    Dental  (+) Poor Dentition, Chipped, Missing   Pulmonary shortness of breath, COPD, neg recent URI, former smoker,     + decreased breath sounds(-) wheezing      Cardiovascular hypertension, + dysrhythmias (CHB s/p pacemaker) + pacemaker   Echo Nov 2017:  - Left ventricle: The cavity size was normal. Systolic function was   normal. The estimated ejection fraction was in the range of 55%   to 65%. - Aortic valve: Valve area (VTI): 2.44 cm^2. Valve area (Vmax):   2.44 cm^2. Valve area (Vmean): 2.41 cm^2. - Mitral valve: There was mild regurgitation.   Neuro/Psych negative neurological ROS  negative psych ROS   GI/Hepatic negative GI ROS, Neg liver ROS,   Endo/Other  diabetesMorbid obesity  Renal/GU negative Renal ROS  negative genitourinary   Musculoskeletal  (+) Arthritis ,   Abdominal   Peds  Hematology  (+) Blood dyscrasia (Hgb 8.8 in June 2020), anemia ,   Anesthesia Other Findings Past Medical History: No date: Arthritis No date: Bradycardia No date: COPD (chronic obstructive pulmonary disease) (HCC) No date: Diabetes mellitus type 2, uncomplicated (HCC) No date: Dyspnea No date: Hyperlipidemia No date: Hypertension No date: Intermittent complete heart block (H[facility])  Past Surgical History: No date: CHOLECYSTECTOMY No date: ESSURE TUBAL LIGATION No date: INSERT / REPLACE / REMOVE PACEMAKER 12/22/2015: PACEMAKER INSERTION; N/A     Comment:  Procedure: INSERTION PACEMAKER;  Surgeon: Isaias Cowman, MD;  Location: [facility] ORS;  Service:               Cardiovascular;  Laterality: N/A;  BMI    Body Mass Index: 44.29 kg/m      Reproductive/Obstetrics negative OB ROS                            Anesthesia Physical Anesthesia Plan  ASA: III  Anesthesia Plan: General   Post-op Pain Management:    Induction:   PONV Risk Score and Plan: Propofol infusion and TIVA  Airway Management Planned: Natural Airway and Simple Face Mask  Additional Equipment:   Intra-op Plan:   Post-operative Plan:   Informed Consent: I have reviewed the patients History and Physical, chart, labs and discussed the procedure including the risks, benefits and alternatives for the proposed anesthesia with the patient or authorized representative who has indicated his/her understanding and acceptance.     Dental Advisory Given  Plan Discussed with: Anesthesiologist and CRNA  Anesthesia Plan Comments:        Anesthesia Quick Evaluation

## 2018-08-11 NOTE — Anesthesia Postprocedure Evaluation (Signed)
Anesthesia Post Note  Patient: Lorraine Boone  Procedure(s) Performed: COLONOSCOPY WITH PROPOFOL (N/A ) ESOPHAGOGASTRODUODENOSCOPY (EGD) WITH PROPOFOL (N/A )  Patient location during evaluation: PACU Anesthesia Type: General Level of consciousness: awake and alert Pain management: pain level controlled Vital Signs Assessment: post-procedure vital signs reviewed and stable Respiratory status: spontaneous breathing, nonlabored ventilation and respiratory function stable Cardiovascular status: blood pressure returned to baseline and stable Postop Assessment: no apparent nausea or vomiting Anesthetic complications: no     Last Vitals:  Vitals:   08/11/18 0721 08/11/18 0923  BP: (!) 155/79   Pulse: 88   Resp: 20   Temp: (!) 36.1 C (!) 36.1 C  SpO2: 99%     Last Pain:  Vitals:   08/11/18 0953  TempSrc:   PainSc: 0-No pain                 Durenda Hurt

## 2018-08-11 NOTE — Op Note (Signed)
[facility] Gastroenterology Patient Name: Lorraine Boone Procedure Date: 08/11/2018 8:07 AM MRN: [id number] Account #: [id number] Date of Birth: 06/13/1950 Admit Type: Outpatient Age: 68 Room: [facility] ENDO ROOM 1 Gender: Female Note Status: Finalized Procedure:            Upper GI endoscopy Indications:          Unexplained iron deficiency anemia Providers:            Lin Landsman MD, MD Medicines:            Monitored Anesthesia Care Complications:        No immediate complications. Estimated blood loss: None. Procedure:            Pre-Anesthesia Assessment:                       - Prior to the procedure, a History and Physical was                        performed, and patient medications and allergies were                        reviewed. The patient is competent. The risks and                        benefits of the procedure and the sedation options and                        risks were discussed with the patient. All questions                        were answered and informed consent was obtained.                        Patient identification and proposed procedure were                        verified by the physician, the nurse, the                        anesthesiologist, the anesthetist and the technician in                        the pre-procedure area in the procedure room in the                        endoscopy suite. Mental Status Examination: alert and                        oriented. Airway Examination: normal oropharyngeal                        airway and neck mobility. Respiratory Examination:                        clear to auscultation. CV Examination: normal.                        Prophylactic Antibiotics: The patient does not require  prophylactic antibiotics. Prior Anticoagulants: The                        patient has taken no previous anticoagulant or                        antiplatelet agents. ASA Grade Assessment:  III - A                        patient with severe systemic disease. After reviewing                        the risks and benefits, the patient was deemed in                        satisfactory condition to undergo the procedure. The                        anesthesia plan was to use monitored anesthesia care                        (MAC). Immediately prior to administration of                        medications, the patient was re-assessed for adequacy                        to receive sedatives. The heart rate, respiratory rate,                        oxygen saturations, blood pressure, adequacy of                        pulmonary ventilation, and response to care were                        monitored throughout the procedure. The physical status                        of the patient was re-assessed after the procedure.                       After obtaining informed consent, the endoscope was                        passed under direct vision. Throughout the procedure,                        the patient's blood pressure, pulse, and oxygen                        saturations were monitored continuously. The Endoscope                        was introduced through the mouth, and advanced to the                        second part of duodenum. The upper GI endoscopy was  accomplished without difficulty. The patient tolerated                        the procedure fairly well. Findings:      The duodenal bulb and second portion of the duodenum were normal.       Biopsies for histology were taken with a cold forceps for evaluation of       celiac disease.      Diffuse atrophic mucosa was found in the gastric fundus and in the       gastric body. Biopsies were taken with a cold forceps for Helicobacter       pylori testing.      The gastric antrum was normal. Biopsies were taken with a cold forceps       for Helicobacter pylori testing.      Esophagogastric landmarks were  identified: the gastroesophageal junction       was found at 39 cm from the incisors.      The gastroesophageal junction and examined esophagus were normal. Impression:           - Normal duodenal bulb and second portion of the                        duodenum. Biopsied.                       - Gastric mucosal atrophy. Biopsied.                       - Normal antrum. Biopsied.                       - Esophagogastric landmarks identified.                       - Normal gastroesophageal junction and esophagus. Recommendation:       - Await pathology results.                       - Proceed with colonoscopy as scheduled                       - See colonoscopy report Procedure Code(s):    --- Professional ---                       [phone], Esophagogastroduodenoscopy, flexible, transoral;                        with biopsy, single or multiple Diagnosis Code(s):    --- Professional ---                       K31.89, Other diseases of stomach and duodenum                       D50.9, Iron deficiency anemia, unspecified CPT copyright 2019 American Medical Association. All rights reserved. The codes documented in this report are preliminary and upon coder review may  be revised to meet current compliance requirements. Dr. Ulyess Mort Lin Landsman MD, MD 08/11/2018 8:55:24 AM This report has been signed electronically. Number of Addenda: 0 Note Initiated On: 08/11/2018 8:07 AM Estimated Blood Loss: Estimated blood loss: none.      [facility]

## 2018-08-11 NOTE — Anesthesia Post-op Follow-up Note (Signed)
Anesthesia QCDR form completed.        

## 2018-08-11 NOTE — H&P (Signed)
Cephas Darby, MD [address]  [place]  [place], [place] [address]  Main: [phone]  Fax: [phone] Pager: [phone]  Primary Care Physician:  Casilda Carls, MD Primary Gastroenterologist:  Dr. Cephas Darby  Pre-Procedure History & Physical: HPI:  Robert Sunga is a 68 y.o. female is here for an endoscopy and colonoscopy.   Past Medical History:  Diagnosis Date  . Arthritis   . Bradycardia   . COPD (chronic obstructive pulmonary disease) ([place])   . Diabetes mellitus type 2, uncomplicated ([place])   . Dyspnea   . Hyperlipidemia   . Hypertension   . Intermittent complete heart block [facility])     Past Surgical History:  Procedure Laterality Date  . CHOLECYSTECTOMY    . ESSURE TUBAL LIGATION    . INSERT / REPLACE / REMOVE PACEMAKER    . PACEMAKER INSERTION N/A 12/22/2015   Procedure: INSERTION PACEMAKER;  Surgeon: Isaias Cowman, MD;  Location: [facility] ORS;  Service: Cardiovascular;  Laterality: N/A;    Prior to Admission medications   Medication Sig Start Date End Date Taking? Authorizing Provider  furosemide (LASIX) 40 MG tablet Take 1 tablet (40 mg total) by mouth daily. 12/23/15  Yes Paraschos, Alexander, MD  Ipratropium-Albuterol (COMBIVENT RESPIMAT) 20-100 MCG/ACT AERS respimat Inhale 1 puff into the lungs every 6 (six) hours.   Yes [provider]  JANUMET XR 50-1000 MG TB24 TK 1 T PO BID 04/28/18  Yes [provider]  losartan (COZAAR) 100 MG tablet Take 100 mg by mouth daily.   Yes [provider]  Grant Ruts INHUB 250-50 MCG/DOSE AEPB INHALE 1 PUFF INTO THE LUNGS TWICE DAILY 07/21/18  Yes Flora Lipps, MD  acetaminophen (TYLENOL) 325 MG tablet Take 650 mg by mouth every 6 (six) hours as needed.    [provider]  ergocalciferol (VITAMIN D2) 50000 units capsule Take 50,000 Units by mouth once a week.    [provider]  Fluticasone-Salmeterol (WIXELA INHUB) 250-50 MCG/DOSE AEPB Inhale 1 puff into the lungs 2 (two)  times daily.    [provider]  insulin glargine (LANTUS) 100 UNIT/ML injection Inject 50 Units into the skin daily.    [provider]  ipratropium-albuterol (DUONEB) 0.5-2.5 (3) MG/3ML SOLN Take 3 mLs by nebulization every 6 (six) hours as needed. 12/24/15   Dustin Flock, MD  metoprolol (LOPRESSOR) 50 MG tablet Take 50 mg by mouth 2 (two) times daily. 04/05/16   [provider]  rosuvastatin (CRESTOR) 10 MG tablet Take 10 mg by mouth daily.    [provider]  Saxagliptin-Metformin (KOMBIGLYZE XR) 2.05-998 MG TB24 Take 1 tablet by mouth 2 (two) times daily.    [provider]    Allergies as of 07/15/2018  . (No Known Allergies)    Family History  Problem Relation Age of Onset  . Diabetes Mother   . Heart disease Mother     Social History   Socioeconomic History  . Marital status: Unknown    Spouse name: Not on file  . Number of children: Not on file  . Years of education: Not on file  . Highest education level: Not on file  Occupational History  . Not on file  Social Needs  . Financial resource strain: Not on file  . Food insecurity    Worry: Not on file    Inability: Not on file  . Transportation needs    Medical: Not on file    Non-medical: Not on file  Tobacco Use  .  Smoking status: Former Smoker    Packs/day: 0.25    Years: 35.00    Pack years: 8.75    Types: Cigarettes    Quit date: 12/21/2015    Years since quitting: 2.6  . Smokeless tobacco: Never Used  Substance and Sexual Activity  . Alcohol use: No  . Drug use: No  . Sexual activity: Not on file  Lifestyle  . Physical activity    Days per week: Not on file    Minutes per session: Not on file  . Stress: Not on file  Relationships  . Social Herbalist on phone: Not on file    Gets together: Not on file    Attends religious service: Not on file    Active member of club or organization: Not on file    Attends meetings of clubs or  organizations: Not on file    Relationship status: Not on file  . Intimate partner violence    Fear of current or ex partner: Not on file    Emotionally abused: Not on file    Physically abused: Not on file    Forced sexual activity: Not on file  Other Topics Concern  . Not on file  Social History Narrative  . Not on file    Review of Systems: See HPI, otherwise negative ROS  Physical Exam: BP (!) 155/79   Pulse 88   Temp (!) 97 F (36.1 C) (Tympanic)   Resp 20   Ht 5\' 3"  (1.6 m)   Wt 113.4 kg   SpO2 99%   BMI 44.29 kg/m  General:   Alert,  pleasant and cooperative in NAD Head:  Normocephalic and atraumatic. Neck:  Supple; no masses or thyromegaly. Lungs:  Clear throughout to auscultation.    Heart:  Regular rate and rhythm. Abdomen:  Soft, nontender and nondistended. Normal bowel sounds, without guarding, and without rebound.   Neurologic:  Alert and  oriented x4;  grossly normal neurologically.  Impression/Plan: Keni Wafer is here for an endoscopy and colonoscopy to be performed for IDA, FIT+  Risks, benefits, limitations, and alternatives regarding  endoscopy and colonoscopy have been reviewed with the patient.  Questions have been answered.  All parties agreeable.   Sherri Sear, MD  08/11/2018, 9:30 AM

## 2018-08-11 NOTE — Transfer of Care (Signed)
Immediate Anesthesia Transfer of Care Note  Patient: Lorraine Boone  Procedure(s) Performed: COLONOSCOPY WITH PROPOFOL (N/A ) ESOPHAGOGASTRODUODENOSCOPY (EGD) WITH PROPOFOL (N/A )  Patient Location: PACU and Endoscopy Unit  Anesthesia Type:General  Level of Consciousness: awake, oriented and patient cooperative  Airway & Oxygen Therapy: Patient Spontanous Breathing and Patient connected to face mask oxygen  Post-op Assessment: Report given to RN, Post -op Vital signs reviewed and stable and Patient moving all extremities  Post vital signs: Reviewed and stable  Last Vitals:  Vitals Value Taken Time  BP 143/48 08/11/18 0923  Temp 36.1 C 08/11/18 0923  Pulse 83 08/11/18 0928  Resp 22 08/11/18 0928  SpO2 100 % 08/11/18 0928  Vitals shown include unvalidated device data.  Last Pain:  Vitals:   08/11/18 0923  TempSrc: Tympanic  PainSc: 0-No pain         Complications: No apparent anesthesia complications

## 2018-08-11 NOTE — Op Note (Signed)
[facility] Gastroenterology Patient Name: Lorraine Boone Procedure Date: 08/11/2018 8:07 AM MRN: [id number] Account #: [id number] Date of Birth: Nov 18, 1950 Admit Type: Outpatient Age: 68 Room: [facility] ENDO ROOM 1 Gender: Female Note Status: Finalized Procedure:            Colonoscopy Indications:          Positive fecal immunochemical test Providers:            Lin Landsman MD, MD Medicines:            Monitored Anesthesia Care Complications:        No immediate complications. Estimated blood loss: None. Procedure:            Pre-Anesthesia Assessment:                       - Prior to the procedure, a History and Physical was                        performed, and patient medications and allergies were                        reviewed. The patient is competent. The risks and                        benefits of the procedure and the sedation options and                        risks were discussed with the patient. All questions                        were answered and informed consent was obtained.                        Patient identification and proposed procedure were                        verified by the physician, the nurse, the                        anesthesiologist, the anesthetist and the technician in                        the pre-procedure area in the procedure room in the                        endoscopy suite. Mental Status Examination: alert and                        oriented. Airway Examination: small/crowded                        oropharyngeal airway. Respiratory Examination: clear to                        auscultation. CV Examination: normal. Prophylactic                        Antibiotics: The patient does not require prophylactic  antibiotics. Prior Anticoagulants: The patient has                        taken no previous anticoagulant or antiplatelet agents.                        ASA Grade Assessment: III - A patient  with severe                        systemic disease. After reviewing the risks and                        benefits, the patient was deemed in satisfactory                        condition to undergo the procedure. The anesthesia plan                        was to use monitored anesthesia care (MAC). Immediately                        prior to administration of medications, the patient was                        re-assessed for adequacy to receive sedatives. The                        heart rate, respiratory rate, oxygen saturations, blood                        pressure, adequacy of pulmonary ventilation, and                        response to care were monitored throughout the                        procedure. The physical status of the patient was                        re-assessed after the procedure.                       After obtaining informed consent, the colonoscope was                        passed under direct vision. Throughout the procedure,                        the patient's blood pressure, pulse, and oxygen                        saturations were monitored continuously. The                        Colonoscope was introduced through the anus and                        advanced to the the terminal ileum, with identification  of the appendiceal orifice and IC valve. The                        colonoscopy was performed without difficulty. The                        patient tolerated the procedure well. The quality of                        the bowel preparation was evaluated using the BBPS                        [facility] Bowel Preparation Scale) with scores of: Right                        Colon = 3, Transverse Colon = 3 and Left Colon = 3                        (entire mucosa seen well with no residual staining,                        small fragments of stool or opaque liquid). The total                        BBPS score equals 9. Findings:      The  perianal and digital rectal examinations were normal. Pertinent       negatives include normal sphincter tone and no palpable rectal lesions.      The terminal ileum appeared normal.      A 7 mm polyp was found in the ascending colon. The polyp was sessile.       The polyp was removed with a hot snare. Resection and retrieval were       complete.      A 5 mm polyp was found in the transverse colon. The polyp was sessile.       The polyp was removed with a cold snare. Resection and retrieval were       complete.      Multiple small and large-mouthed diverticula were found in the entire       colon. There was no evidence of diverticular bleeding.      The retroflexed view of the distal rectum and anal verge was normal and       showed no anal or rectal abnormalities. Impression:           - The examined portion of the ileum was normal.                       - One 7 mm polyp in the ascending colon, removed with a                        hot snare. Resected and retrieved.                       - One 5 mm polyp in the transverse colon, removed with                        a cold snare. Resected and retrieved.                       -  Severe diverticulosis in the entire examined colon.                        There was no evidence of diverticular bleeding.                       - The distal rectum and anal verge are normal on                        retroflexion view. Recommendation:       - Discharge patient to home (with escort).                       - High fiber diet and diabetic (ADA) diet.                       - Continue present medications.                       - Await pathology results.                       - Repeat colonoscopy in 5 years for surveillance.                       - Return to my office as previously scheduled. Procedure Code(s):    --- Professional ---                       [phone], Colonoscopy, flexible; with removal of tumor(s),                        polyp(s), or other  lesion(s) by snare technique Diagnosis Code(s):    --- Professional ---                       K63.5, Polyp of colon                       R19.5, Other fecal abnormalities                       K57.30, Diverticulosis of large intestine without                        perforation or abscess without bleeding CPT copyright 2019 American Medical Association. All rights reserved. The codes documented in this report are preliminary and upon coder review may  be revised to meet current compliance requirements. Dr. Ulyess Mort Lin Landsman MD, MD 08/11/2018 9:20:57 AM This report has been signed electronically. Number of Addenda: 0 Note Initiated On: 08/11/2018 8:07 AM Scope Withdrawal Time: 0 hours 10 minutes 23 seconds  Total Procedure Duration: 0 hours 17 minutes 27 seconds  Estimated Blood Loss: Estimated blood loss: none.      [facility]

## 2018-08-12 ENCOUNTER — Telehealth: Payer: Self-pay | Admitting: Gastroenterology

## 2018-08-12 ENCOUNTER — Encounter: Payer: Self-pay | Admitting: Gastroenterology

## 2018-08-12 LAB — INTRINSIC FACTOR ANTIBODIES: Intrinsic Factor: 1 AU/mL (ref 0.0–1.1)

## 2018-08-12 LAB — SURGICAL PATHOLOGY

## 2018-08-12 LAB — ANTI-PARIETAL ANTIBODY: Parietal Cell Antibody-IgG: 103.8 Units — ABNORMAL HIGH (ref 0.0–20.0)

## 2018-08-12 NOTE — Telephone Encounter (Signed)
Patient called in stating that she had procedure yesterday & DR Marius Ditch called in B-12 to [facility] in [place]. Per patient they need clarification on dosage.

## 2018-08-13 LAB — COPPER, SERUM: Copper: 124 ug/dL (ref 72–166)

## 2018-08-13 LAB — VITAMIN B12: Vitamin B-12: 95 pg/mL — ABNORMAL LOW (ref 180–914)

## 2018-08-13 NOTE — Telephone Encounter (Signed)
Spoke with pt and she has been instructed to administer 1 ml, pt verbalized understanding

## 2018-08-14 ENCOUNTER — Encounter: Payer: Self-pay | Admitting: Oncology

## 2018-08-14 ENCOUNTER — Other Ambulatory Visit: Payer: Self-pay

## 2018-08-14 ENCOUNTER — Inpatient Hospital Stay: Payer: Medicare Other | Attending: Oncology | Admitting: Oncology

## 2018-08-14 VITALS — BP 188/79 | HR 78 | Temp 98.8°F | Resp 22 | Wt 255.0 lb

## 2018-08-14 DIAGNOSIS — R0609 Other forms of dyspnea: Secondary | ICD-10-CM | POA: Diagnosis not present

## 2018-08-14 DIAGNOSIS — I10 Essential (primary) hypertension: Secondary | ICD-10-CM

## 2018-08-14 DIAGNOSIS — E785 Hyperlipidemia, unspecified: Secondary | ICD-10-CM | POA: Diagnosis not present

## 2018-08-14 DIAGNOSIS — Z7951 Long term (current) use of inhaled steroids: Secondary | ICD-10-CM | POA: Diagnosis not present

## 2018-08-14 DIAGNOSIS — E538 Deficiency of other specified B group vitamins: Secondary | ICD-10-CM

## 2018-08-14 DIAGNOSIS — Z87891 Personal history of nicotine dependence: Secondary | ICD-10-CM | POA: Insufficient documentation

## 2018-08-14 DIAGNOSIS — Z950 Presence of cardiac pacemaker: Secondary | ICD-10-CM | POA: Diagnosis not present

## 2018-08-14 DIAGNOSIS — E669 Obesity, unspecified: Secondary | ICD-10-CM | POA: Insufficient documentation

## 2018-08-14 DIAGNOSIS — R5383 Other fatigue: Secondary | ICD-10-CM | POA: Diagnosis not present

## 2018-08-14 DIAGNOSIS — M1990 Unspecified osteoarthritis, unspecified site: Secondary | ICD-10-CM | POA: Diagnosis not present

## 2018-08-14 DIAGNOSIS — E119 Type 2 diabetes mellitus without complications: Secondary | ICD-10-CM | POA: Diagnosis not present

## 2018-08-14 DIAGNOSIS — J449 Chronic obstructive pulmonary disease, unspecified: Secondary | ICD-10-CM

## 2018-08-14 DIAGNOSIS — D500 Iron deficiency anemia secondary to blood loss (chronic): Secondary | ICD-10-CM | POA: Insufficient documentation

## 2018-08-14 DIAGNOSIS — Z79899 Other long term (current) drug therapy: Secondary | ICD-10-CM

## 2018-08-14 DIAGNOSIS — R740 Nonspecific elevation of levels of transaminase and lactic acid dehydrogenase [LDH]: Secondary | ICD-10-CM | POA: Diagnosis not present

## 2018-08-14 DIAGNOSIS — Z794 Long term (current) use of insulin: Secondary | ICD-10-CM

## 2018-08-14 MED ORDER — FERROUS SULFATE 325 (65 FE) MG PO TBEC: 325.0000 mg | DELAYED_RELEASE_TABLET | Freq: Three times a day (TID) | ORAL | 1 refills | Status: AC

## 2018-08-14 NOTE — Progress Notes (Addendum)
Hematology/Oncology Consult note [facility] [phone] [phone]   Patient Care Team: Casilda Carls, MD as PCP - General  REFERRING PROVIDER: Lin Landsman, MD CHIEF COMPLAINTS/REASON FOR VISIT:  Evaluation of iron deficiency anemia  HISTORY OF PRESENTING ILLNESS:  Lorraine Boone is a  68 y.o.  female with PMH listed below was seen in consultation at the request of Lin Landsman, MD   for evaluation of iron deficiency anemia.   Reviewed patient's recent labs  07/14/2018 labs revealed anemia with hemoglobin of 8.3.  MCV 77.7, white count 6.9, platelet count 337,000 LFT shows slightly elevated AST at 44, ALT 18, bilirubin 0.3, alkaline phosphatase 85. Iron panel showed TIBC 445, iron saturation 6, ferritin 11.  Folate 9.5, vitamin B12 125.  Patient has not had any CBC results available in current EMR.  Associated signs and symptoms: Patient reports fatigue.  Admits to SOB with exertion.  Denies weight loss, easy bruising, hematochezia, hemoptysis, hematuria. Context:  History of iron deficiency: Denies Rectal bleeding: Denies Menstrual bleeding/ Vaginal bleeding : Denies Hematemesis or hemoptysis : denies Blood in urine : denies  Last endoscopy: 08/11/2018  Colonoscopy the examined portion of the ileum was normal. - One 7 mm polyp in the ascending colon, removed with a hot snare. Resected and retrieved.- One 5 mm polyp in the transverse colon, removed with a cold snare. Resected and retrieved.- Severe diverticulosis in the entire examined colon. There was no evidence of diverticular bleeding.- The distal rectum and anal verge are normal on retroflexion view. Upper endoscopy Normal duodenal bulb and second portion of the duodenum. Biopsied. - Gastric mucosal atrophy. Biopsied. - Normal antrum. Biopsied. - Esophagogastric landmarks identified. - Normal gastroesophageal junction and esophagus  Duodenal biopsy negative for  villous atrophy, increased intraepithelial lymphocytes and organisms Stomach random biopsy shows benign gastric mucosa with foveolar hyperplastic, mild chronic gastritis and focal intestinal metaplasia.  Negative for dysplasia or malignancy. Colon polyps showed tubular villous adenoma, negative for high-grade dysplasia and malignancy.  Patient is not on blood thinners.  Currently she takes oral iron supplementation.  Fatigue: Yes.  SOB: deneis    Review of Systems  Constitutional: Positive for fatigue. Negative for appetite change, chills and fever.  HENT:   Negative for hearing loss and voice change.   Eyes: Negative for eye problems.  Respiratory: Negative for chest tightness and cough.   Cardiovascular: Negative for chest pain.  Gastrointestinal: Negative for abdominal distention, abdominal pain and blood in stool.  Endocrine: Negative for hot flashes.  Genitourinary: Negative for difficulty urinating and frequency.   Musculoskeletal: Negative for arthralgias.  Skin: Negative for itching and rash.  Neurological: Negative for extremity weakness.  Hematological: Negative for adenopathy.  Psychiatric/Behavioral: Negative for confusion.    MEDICAL HISTORY:  Past Medical History:  Diagnosis Date  . Arthritis   . Bradycardia   . COPD (chronic obstructive pulmonary disease) ([place])   . Diabetes mellitus type 2, uncomplicated ([place])   . Dyspnea   . Hyperlipidemia   . Hypertension   . Intermittent complete heart block ([place])     SURGICAL HISTORY: Past Surgical History:  Procedure Laterality Date  . CHOLECYSTECTOMY    . COLONOSCOPY WITH PROPOFOL N/A 08/11/2018   Procedure: COLONOSCOPY WITH PROPOFOL;  Surgeon: Lin Landsman, MD;  Location: [facility] ENDOSCOPY;  Service: Gastroenterology;  Laterality: N/A;  . ESOPHAGOGASTRODUODENOSCOPY (EGD) WITH PROPOFOL N/A 08/11/2018   Procedure: ESOPHAGOGASTRODUODENOSCOPY (EGD) WITH PROPOFOL;  Surgeon: Lin Landsman, MD;  Location: [facility]  ENDOSCOPY;  Service: Gastroenterology;  Laterality: N/A;  . ESSURE TUBAL LIGATION    . INSERT / REPLACE / REMOVE PACEMAKER    . PACEMAKER INSERTION N/A 12/22/2015   Procedure: INSERTION PACEMAKER;  Surgeon: Isaias Cowman, MD;  Location: [facility] ORS;  Service: Cardiovascular;  Laterality: N/A;    SOCIAL HISTORY: Social History   Socioeconomic History  . Marital status: Unknown    Spouse name: Not on file  . Number of children: Not on file  . Years of education: Not on file  . Highest education level: Not on file  Occupational History  . Not on file  Social Needs  . Financial resource strain: Not on file  . Food insecurity    Worry: Not on file    Inability: Not on file  . Transportation needs    Medical: Not on file    Non-medical: Not on file  Tobacco Use  . Smoking status: Former Smoker    Packs/day: 0.25    Years: 35.00    Pack years: 8.75    Types: Cigarettes    Quit date: 12/21/2015    Years since quitting: 2.6  . Smokeless tobacco: Never Used  Substance and Sexual Activity  . Alcohol use: No  . Drug use: No  . Sexual activity: Not on file  Lifestyle  . Physical activity    Days per week: Not on file    Minutes per session: Not on file  . Stress: Not on file  Relationships  . Social Herbalist on phone: Not on file    Gets together: Not on file    Attends religious service: Not on file    Active member of club or organization: Not on file    Attends meetings of clubs or organizations: Not on file    Relationship status: Not on file  . Intimate partner violence    Fear of current or ex partner: Not on file    Emotionally abused: Not on file    Physically abused: Not on file    Forced sexual activity: Not on file  Other Topics Concern  . Not on file  Social History Narrative  . Not on file    FAMILY HISTORY: Family History  Problem Relation Age of Onset  . Diabetes Mother   . Heart disease Mother     ALLERGIES:  has No Known  Allergies.  MEDICATIONS:  Current Outpatient Medications  Medication Sig Dispense Refill  . acetaminophen (TYLENOL) 325 MG tablet Take 650 mg by mouth every 6 (six) hours as needed.    . cyanocobalamin (,VITAMIN B-12,) 1000 MCG/ML injection Inject 1 mL (1,000 mcg total) into the skin once a week for 10 doses. Weekly for 4 weeks then alternate week for another 4weeks then once every 4weeks 10 mL 0  . ergocalciferol (VITAMIN D2) 50000 units capsule Take 50,000 Units by mouth once a week.    . Fluticasone-Salmeterol (WIXELA INHUB) 250-50 MCG/DOSE AEPB Inhale 1 puff into the lungs 2 (two) times daily.    . furosemide (LASIX) 40 MG tablet Take 1 tablet (40 mg total) by mouth daily. 30 tablet 5  . insulin glargine (LANTUS) 100 UNIT/ML injection Inject 50 Units into the skin daily.    . Ipratropium-Albuterol (COMBIVENT RESPIMAT) 20-100 MCG/ACT AERS respimat Inhale 1 puff into the lungs every 6 (six) hours.    Marland Kitchen ipratropium-albuterol (DUONEB) 0.5-2.5 (3) MG/3ML SOLN Take 3 mLs by nebulization every 6 (six) hours as needed. 360 mL 1  .  JANUMET XR 50-1000 MG TB24 TK 1 T PO BID    . losartan (COZAAR) 100 MG tablet Take 100 mg by mouth daily.    . metoprolol (LOPRESSOR) 50 MG tablet Take 50 mg by mouth 2 (two) times daily.    . rosuvastatin (CRESTOR) 10 MG tablet Take 10 mg by mouth daily.    . Saxagliptin-Metformin (KOMBIGLYZE XR) 2.05-998 MG TB24 Take 1 tablet by mouth 2 (two) times daily.    Grant Ruts INHUB 250-50 MCG/DOSE AEPB INHALE 1 PUFF INTO THE LUNGS TWICE DAILY 60 each 11   No current facility-administered medications for this visit.      PHYSICAL EXAMINATION: ECOG PERFORMANCE STATUS: 1 - Symptomatic but completely ambulatory Vitals:   08/14/18 1346  BP: (!) 188/79  Pulse: 78  Resp: (!) 22  Temp: 98.8 F (37.1 C)  SpO2: 97%   Filed Weights   08/14/18 1346  Weight: 255 lb (115.7 kg)    Physical Exam Constitutional:      General: She is not in acute distress.    Appearance: She  is obese.  HENT:     Head: Normocephalic and atraumatic.  Eyes:     General: No scleral icterus.    Pupils: Pupils are equal, round, and reactive to light.  Neck:     Musculoskeletal: Normal range of motion and neck supple.  Cardiovascular:     Rate and Rhythm: Normal rate and regular rhythm.     Heart sounds: Normal heart sounds.     Comments: Patient has a pacemaker Pulmonary:     Effort: Pulmonary effort is normal. No respiratory distress.     Breath sounds: No wheezing.  Abdominal:     General: Bowel sounds are normal. There is no distension.     Palpations: Abdomen is soft. There is no mass.     Tenderness: There is no abdominal tenderness.  Musculoskeletal: Normal range of motion.        General: No deformity.  Skin:    General: Skin is warm and dry.     Coloration: Skin is pale.     Findings: No erythema or rash.  Neurological:     Mental Status: She is alert and oriented to person, place, and time.     Cranial Nerves: No cranial nerve deficit.     Coordination: Coordination normal.  Psychiatric:        Behavior: Behavior normal.        Thought Content: Thought content normal.       CMP Latest Ref Rng & Units 07/14/2018  Glucose 65 - 99 mg/dL -  BUN 6 - 20 mg/dL -  Creatinine 0.44 - 1.00 mg/dL -  Sodium 135 - 145 mmol/L -  Potassium 3.5 - 5.1 mmol/L -  Chloride 101 - 111 mmol/L -  CO2 22 - 32 mmol/L -  Calcium 8.9 - 10.3 mg/dL -  Total Protein 6.0 - 8.5 g/dL 6.4  Total Bilirubin 0.0 - 1.2 mg/dL 0.3  Alkaline Phos 39 - 117 IU/L 85  AST 0 - 40 IU/L 44(H)  ALT 0 - 32 IU/L 18   CBC Latest Ref Rng & Units 08/11/2018  WBC 4.0 - 10.5 K/uL 6.9  Hemoglobin 12.0 - 15.0 g/dL 8.3(L)  Hematocrit 36.0 - 46.0 % 27.5(L)  Platelets 150 - 400 K/uL 266     LABORATORY DATA:  I have reviewed the data as listed Lab Results  Component Value Date   WBC 6.9 08/11/2018   HGB 8.3 (L)  08/11/2018   HCT 27.5 (L) 08/11/2018   MCV 77.7 (L) 08/11/2018   PLT 266 08/11/2018    Recent Labs    07/14/18 1447  PROT 6.4  ALBUMIN 4.0  AST 44*  ALT 18  ALKPHOS 85  BILITOT 0.3  BILIDIR 0.13   Iron/TIBC/Ferritin/ %Sat    Component Value Date/Time   IRON 21 (L) 08/11/2018 0938   IRON 26 (L) 07/14/2018 1447   TIBC 454 (H) 08/11/2018 0938   TIBC 445 07/14/2018 1447   FERRITIN 8 (L) 08/11/2018 0938   FERRITIN 11 (L) 07/14/2018 1447   IRONPCTSAT 5 (L) 08/11/2018 0938   IRONPCTSAT 6 (LL) 07/14/2018 1447     No results found.    ASSESSMENT & PLAN:  1. Iron deficiency anemia due to chronic blood loss   2. Vitamin B12 deficiency   Labs are reviewed and discussed with patient. Consistent with severe iron deficiency anemia. Patient has been taking over-the-counter oral iron supplementation. Recommend IV iron with Venofer 200mg  weekly x 4 doses. Allergy reactions/infusion reaction including anaphylactic reaction discussed with patient. Other side effects include but not limited to high blood pressure, skin rash, weight gain, leg swelling, etc.   Patient prefers to defer IV Venofer treatments for now.  She would like to try oral iron supplementation first. I sent prescription of ferrous sulfate 325 mg 3 times daily to her pharmacy.  #Vitamin B12 deficiency, agree with vitamin B12 injections, started by gastroenterology Dr. Marius Ditch  Patient does not want to have further lab work-up done today. Check CBC/reticulocyte count in 4 weeks to trend her hemoglobin. Lab MD 2  months for reassessment. +/- Venfoer  # Addendum, I communicated with Dr.Vanga. Patient has agreed to IV iron treatments. Will arrange her to received IV Venofer 200mg  weekly x 4.  I called patient and confirmed with her that she has agreed to proceed with IV Venfoer treatments.  She can follow up in 10 week for re-assessment.     Orders Placed This Encounter  Procedures  . CBC with Differential/Platelet    Standing Status:   Future    Standing Expiration Date:   08/15/2019  . Iron and TIBC     Standing Status:   Future    Standing Expiration Date:   08/15/2019  . Ferritin    Standing Status:   Future    Standing Expiration Date:   08/15/2019  . CBC with Differential/Platelet    Standing Status:   Future    Standing Expiration Date:   08/15/2019  . Retic Panel    Standing Status:   Future    Standing Expiration Date:   08/15/2019  . Comprehensive metabolic panel    Standing Status:   Future    Standing Expiration Date:   08/15/2019    All questions were answered. The patient knows to call the clinic with any problems questions or concerns.  Cc Vanga, Tally Due, MD  Return of visit: 3 months.  Thank you for this kind referral and the opportunity to participate in the care of this patient. A copy of today's note is routed to referring provider  Total face to face encounter time for this patient visit was 74min. >50% of the time was  spent in counseling and coordination of care.    Earlie Server, MD, PhD Hematology Oncology [facility] 08/14/2018

## 2018-08-18 ENCOUNTER — Telehealth: Payer: Self-pay | Admitting: Gastroenterology

## 2018-08-18 ENCOUNTER — Encounter: Payer: Self-pay | Admitting: Oncology

## 2018-08-18 DIAGNOSIS — D500 Iron deficiency anemia secondary to blood loss (chronic): Secondary | ICD-10-CM | POA: Insufficient documentation

## 2018-08-18 HISTORY — DX: Iron deficiency anemia secondary to blood loss (chronic): D50.0

## 2018-08-18 NOTE — Telephone Encounter (Signed)
I just called patient and discussed with her about IV iron and she agreed to it.  She has autoimmune atrophic gastritis and I am not positive that oral iron will help. Patient agreed to receive IV iron. She received B12 injection last Wednesday   Cephas Darby, MD [address]  [place]  [place], [place] [address]  Main: [phone]  Fax: [phone] Pager: [phone]

## 2018-08-18 NOTE — Addendum Note (Signed)
Addended by: Earlie Server on: 08/18/2018 03:15 PM   Modules accepted: Orders

## 2018-08-18 NOTE — Telephone Encounter (Signed)
-----   Message from Earlie Server, MD sent at 08/15/2018  1:06 PM EDT ----- Dear Dr. Marius Ditch, I had the pleasure seeing Lorraine Boone  for evaluation of iron deficiency anemiaAttached is a copy of today's clinic visit note.  Thank you for this kind referral and the opportunity to involve me in participating in the care of this patient. Please do not hesitate to call me if any questions or concerns. Earlie Server MD PhDHematolgy [place] at [facility] [phone] Phone: [phone]

## 2018-08-18 NOTE — Telephone Encounter (Signed)
That's great that she changed her mind. I will arrange. Appreciate the referral and your support.

## 2018-08-19 ENCOUNTER — Telehealth: Payer: Self-pay | Admitting: *Deleted

## 2018-08-19 NOTE — Telephone Encounter (Signed)
Called pt and made her aware of her upcoming appts per Almyra Free 08/18/18 staff message Appts were scheduled as requested. Reminder letter mailed out. Patient is aware.

## 2018-08-25 ENCOUNTER — Other Ambulatory Visit: Payer: Self-pay

## 2018-08-25 ENCOUNTER — Inpatient Hospital Stay: Payer: Medicare Other | Attending: Oncology

## 2018-08-25 VITALS — BP 184/75 | HR 72 | Resp 20

## 2018-08-25 DIAGNOSIS — D500 Iron deficiency anemia secondary to blood loss (chronic): Secondary | ICD-10-CM | POA: Diagnosis not present

## 2018-08-25 DIAGNOSIS — E538 Deficiency of other specified B group vitamins: Secondary | ICD-10-CM | POA: Diagnosis not present

## 2018-08-25 MED ORDER — IRON SUCROSE 20 MG/ML IV SOLN
200.0000 mg | Freq: Once | INTRAVENOUS | Status: AC
  Administered 2018-08-25: 14:00:00 200 mg via INTRAVENOUS
  Filled 2018-08-25: qty 10

## 2018-08-25 MED ORDER — SODIUM CHLORIDE 0.9 % IV SOLN
Freq: Once | INTRAVENOUS | Status: AC
  Administered 2018-08-25: 13:00:00 via INTRAVENOUS
  Filled 2018-08-25: qty 250

## 2018-08-28 NOTE — Telephone Encounter (Signed)
Spoke with pharmacist and medication has been clarified, pt has been notified

## 2018-09-01 ENCOUNTER — Inpatient Hospital Stay: Payer: Medicare Other

## 2018-09-01 ENCOUNTER — Other Ambulatory Visit: Payer: Self-pay

## 2018-09-01 NOTE — Progress Notes (Signed)
No iron infusion today due to hypertension. Dr. Tasia Catchings made aware. Pt will return on Monday 8/17 for next iron treatment. Patient informed to recheck her BP once she gets home and to call her physician if it remains elevated. She is also going to log her BP daily and bring it with her on Monday. Patient verbalizes understanding.

## 2018-09-05 ENCOUNTER — Other Ambulatory Visit: Payer: Self-pay

## 2018-09-08 ENCOUNTER — Other Ambulatory Visit: Payer: Self-pay

## 2018-09-08 ENCOUNTER — Inpatient Hospital Stay: Payer: Medicare Other

## 2018-09-08 VITALS — BP 144/78 | HR 72 | Temp 98.1°F

## 2018-09-08 DIAGNOSIS — D500 Iron deficiency anemia secondary to blood loss (chronic): Secondary | ICD-10-CM | POA: Diagnosis not present

## 2018-09-08 IMAGING — DX DG CHEST 1V PORT
1 series · 1 of 1 positions shown · non-contrast
Comparison: Portable exam 4244 hours without priors for comparison

CLINICAL DATA: Post cardiac pacemaker implantation

EXAM:
PORTABLE CHEST 1 VIEW

[chest ap]
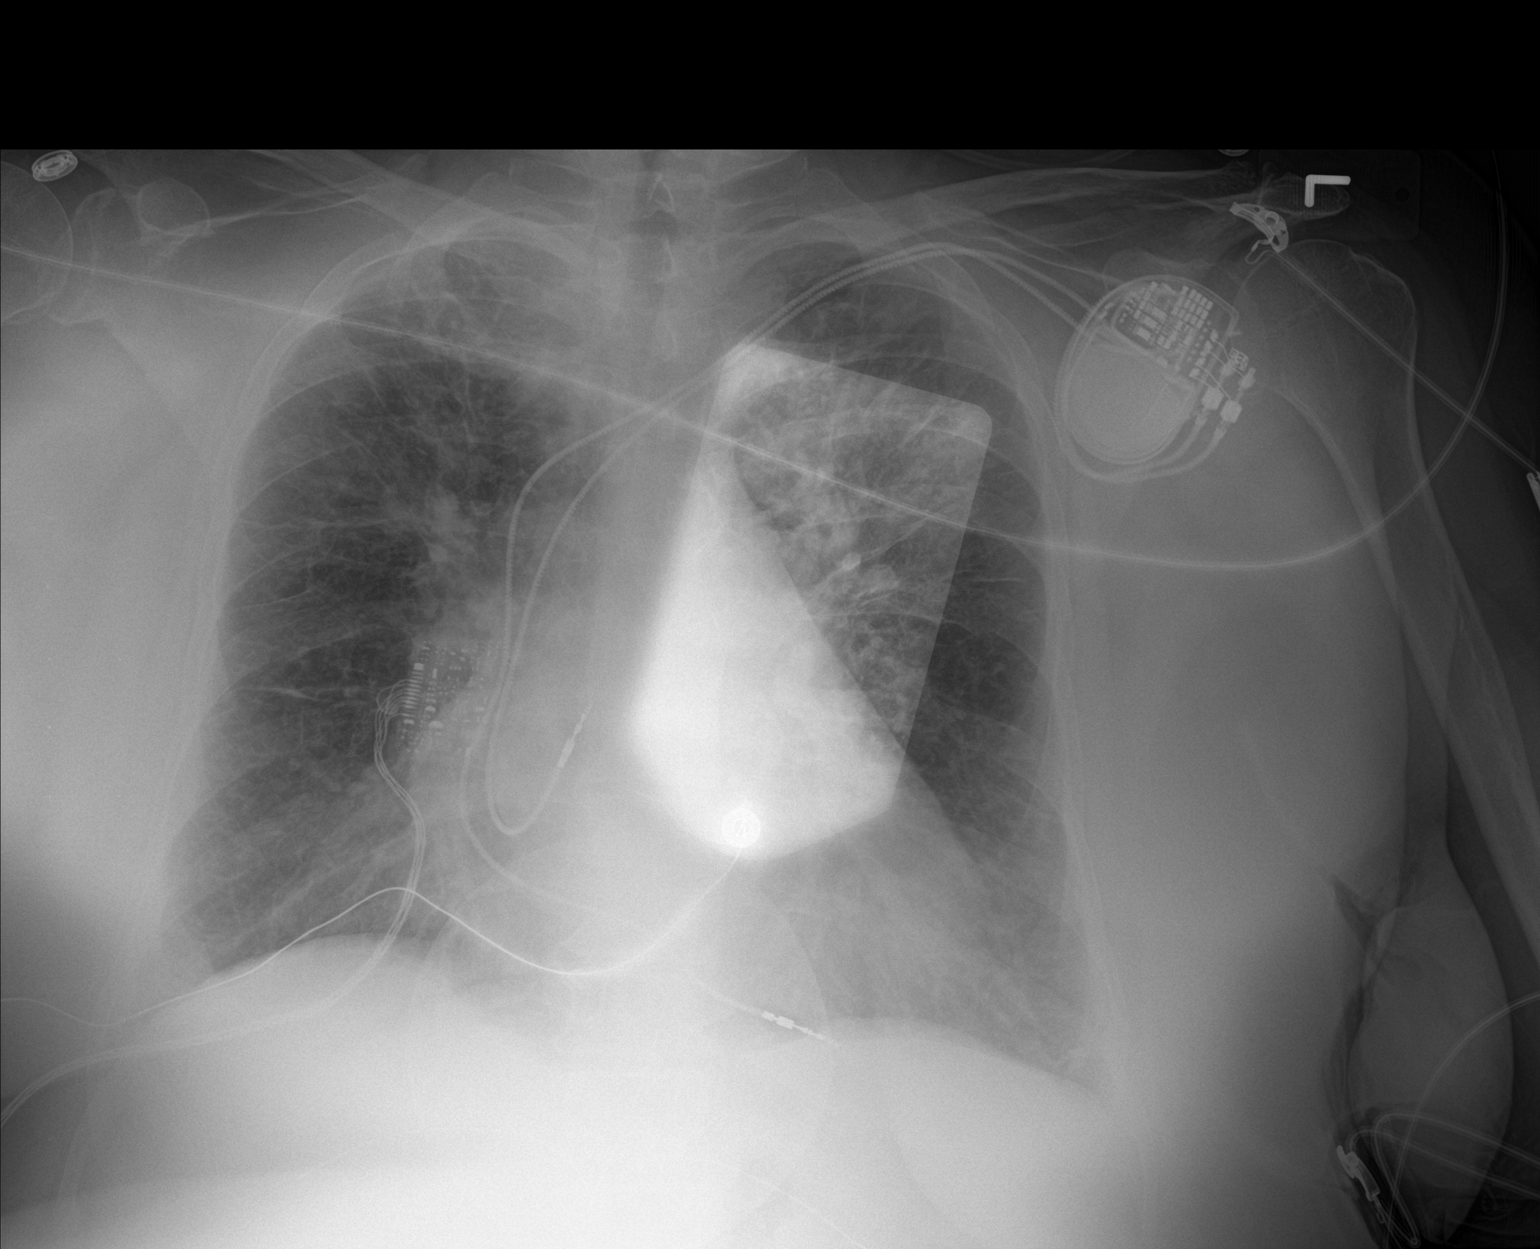

[1 of 1 positions shown; findings below may reference images not displayed]

FINDINGS: LEFT subclavian transvenous pacemaker with leads projecting over
RIGHT atrium and RIGHT ventricle.

External pacing leads project over chest.

Enlargement of cardiac silhouette with pulmonary vascular
congestion.

Minimal interstitial infiltrates in the perihilar and upper lung
regions, question minimal edema.

No pleural effusion or pneumothorax.

Bones demineralized.
IMPRESSION: No pneumothorax following pacemaker insertion.

Question minimal pulmonary edema.

## 2018-09-08 MED ORDER — SODIUM CHLORIDE 0.9 % IV SOLN
Freq: Once | INTRAVENOUS | Status: AC
  Administered 2018-09-08: 14:00:00 via INTRAVENOUS
  Filled 2018-09-08: qty 250

## 2018-09-08 MED ORDER — IRON SUCROSE 20 MG/ML IV SOLN
200.0000 mg | Freq: Once | INTRAVENOUS | Status: AC
  Administered 2018-09-08: 200 mg via INTRAVENOUS
  Filled 2018-09-08: qty 10

## 2018-09-11 ENCOUNTER — Other Ambulatory Visit: Payer: Medicare Other

## 2018-09-12 ENCOUNTER — Other Ambulatory Visit: Payer: Self-pay

## 2018-09-15 ENCOUNTER — Other Ambulatory Visit: Payer: Self-pay

## 2018-09-15 ENCOUNTER — Encounter (INDEPENDENT_AMBULATORY_CARE_PROVIDER_SITE_OTHER): Payer: Self-pay

## 2018-09-15 ENCOUNTER — Inpatient Hospital Stay: Payer: Medicare Other

## 2018-09-15 VITALS — BP 146/68 | HR 63 | Temp 98.3°F | Resp 18

## 2018-09-15 DIAGNOSIS — D500 Iron deficiency anemia secondary to blood loss (chronic): Secondary | ICD-10-CM | POA: Diagnosis not present

## 2018-09-15 MED ORDER — IRON SUCROSE 20 MG/ML IV SOLN
200.0000 mg | Freq: Once | INTRAVENOUS | Status: AC
  Administered 2018-09-15: 200 mg via INTRAVENOUS
  Filled 2018-09-15: qty 10

## 2018-09-15 MED ORDER — SODIUM CHLORIDE 0.9 % IV SOLN
Freq: Once | INTRAVENOUS | Status: AC
  Administered 2018-09-15: 14:00:00 via INTRAVENOUS
  Filled 2018-09-15: qty 250

## 2018-09-19 ENCOUNTER — Other Ambulatory Visit: Payer: Self-pay

## 2018-09-22 ENCOUNTER — Inpatient Hospital Stay: Payer: Medicare Other

## 2018-09-22 ENCOUNTER — Other Ambulatory Visit: Payer: Self-pay

## 2018-09-22 VITALS — BP 160/55 | HR 60 | Temp 97.3°F | Resp 19

## 2018-09-22 DIAGNOSIS — D500 Iron deficiency anemia secondary to blood loss (chronic): Secondary | ICD-10-CM

## 2018-09-22 MED ORDER — SODIUM CHLORIDE 0.9 % IV SOLN
Freq: Once | INTRAVENOUS | Status: AC
  Administered 2018-09-22: 14:00:00 via INTRAVENOUS
  Filled 2018-09-22: qty 250

## 2018-09-22 MED ORDER — IRON SUCROSE 20 MG/ML IV SOLN
200.0000 mg | Freq: Once | INTRAVENOUS | Status: AC
  Administered 2018-09-22: 200 mg via INTRAVENOUS
  Filled 2018-09-22: qty 10

## 2018-10-09 ENCOUNTER — Ambulatory Visit: Payer: Medicare Other | Admitting: Oncology

## 2018-10-09 ENCOUNTER — Other Ambulatory Visit: Payer: Medicare Other

## 2018-10-10 ENCOUNTER — Ambulatory Visit: Payer: Medicare Other

## 2018-10-10 ENCOUNTER — Ambulatory Visit: Payer: Medicare Other | Admitting: Oncology

## 2018-10-13 ENCOUNTER — Other Ambulatory Visit: Payer: Self-pay

## 2018-10-13 ENCOUNTER — Ambulatory Visit (INDEPENDENT_AMBULATORY_CARE_PROVIDER_SITE_OTHER): Payer: Medicare Other | Admitting: Gastroenterology

## 2018-10-13 VITALS — BP 155/73 | HR 73 | Temp 98.5°F | Wt 253.2 lb

## 2018-10-13 DIAGNOSIS — K2940 Chronic atrophic gastritis without bleeding: Secondary | ICD-10-CM

## 2018-10-13 DIAGNOSIS — R945 Abnormal results of liver function studies: Secondary | ICD-10-CM

## 2018-10-13 DIAGNOSIS — D508 Other iron deficiency anemias: Secondary | ICD-10-CM | POA: Diagnosis not present

## 2018-10-13 DIAGNOSIS — D518 Other vitamin B12 deficiency anemias: Secondary | ICD-10-CM | POA: Diagnosis not present

## 2018-10-13 DIAGNOSIS — R7989 Other specified abnormal findings of blood chemistry: Secondary | ICD-10-CM

## 2018-10-13 NOTE — Progress Notes (Signed)
Cephas Darby, MD [address]  [place]  [place], [place] [address]  Main: [phone]  Fax: [phone]    Gastroenterology Consultation  Referring Provider:     Casilda Carls, MD Primary Care Physician:  Casilda Carls, MD Primary Gastroenterologist:  Dr. Cephas Darby Reason for Consultation: Iron deficiency anemia        HPI:   Lorraine Boone is a 68 y.o. female referred by Dr. Casilda Carls, MD  for consultation & management of microcytic anemia, new onset.  Patient has history of metabolic syndrome, complete heart block, status post dual-chamber pacemaker is referred for evaluation of new onset of microcytic anemia.  Patient had labs from outside in 04/2018 which revealed hemoglobin 9.1, MCV 78.5, normal platelets.  She is also FIT positive in 02/2017.  Her hemoglobin A1c 8.3, she is taking oral iron 1 pill a day, started 2 weeks ago.  She is accompanied by her husband today.  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  Patient received parenteral iron infusion as well as monthly B12 subcutaneous injections.  She reports her energy levels are better.  She has been not doing well in her personal life due to deaths in her family.  She is trying to cope up and doing okay.  Currently, she is doing monthly B12 shots.  NSAIDs: None  Antiplts/Anticoagulants/Anti thrombotics: None  GI Procedures:   DIAGNOSIS:  EGD and colonoscopy 08/11/2018 - Normal duodenal bulb and second portion of the duodenum. Biopsied. - Gastric mucosal atrophy. Biopsied. - Normal antrum. Biopsied. - Esophagogastric landmarks identified. - Normal gastroesophageal junction and esophagus.  - The examined portion of the ileum was normal. -  One 7 mm polyp in the ascending colon, removed with a hot snare. Resected and retrieved. - One 5 mm polyp in the transverse colon, removed with a cold snare. Resected and retrieved. - Severe diverticulosis in the entire examined colon. There was no evidence of diverticular bleeding. - The distal rectum and anal verge are normal on retroflexion view.   A. DUODENUM; BIOPSY:  -NEGATIVE FOR VILLOUS ATROPHY, INCREASED INTRAEPITHELIAL LYMPHOCYTES AND  ORGANISMS.   B. STOMACH, RANDOM; BIOPSIES:  -BENIGN GASTRIC MUCOSA WITH FOVEOLAR HYPERPLASIA, MILD CHRONIC GASTRITIS  AND FOCAL INTESTINAL METAPLASIA.  -NEGATIVE FOR H. PYLORI, DYSPLASIA AND MALIGNANCY.  SEE COMMENT.   COMMENT.  No parietal cells are identified. If some of these random biopsies were  taken from the body of the stomach, these may represent atrophic  gastritis.  She denies family history of GI malignancy  C. COLON POLYP, ASCENDING; BIOPSY:  - TUBULOVILLOUS ADENOMA (MULTIPLE FRAGMENTS).  - NEGATIVE FOR HIGH-GRADE DYSPLASIA AND MALIGNANCY.   D. COLON POLYP, TRANSVERSE; BIOPSY:  - TUBULAR ADENOMA.  - NEGATIVE FOR HIGH-GRADE DYSPLASIA AND MALIGNANCY.   Past Medical History:  Diagnosis Date  . Arthritis   . Bradycardia   . COPD (chronic obstructive pulmonary disease) ([place])   . Diabetes mellitus type 2, uncomplicated ([place])   . Dyspnea   . Hyperlipidemia   . Hypertension   . Intermittent complete heart block ([place])   . Iron deficiency anemia due to chronic blood loss 08/18/2018    Past Surgical History:  Procedure Laterality Date  . CHOLECYSTECTOMY    . COLONOSCOPY WITH PROPOFOL N/A 08/11/2018  Procedure: COLONOSCOPY WITH PROPOFOL;  Surgeon: Lin Landsman, MD;  Location: [facility] ENDOSCOPY;  Service: Gastroenterology;  Laterality: N/A;  . ESOPHAGOGASTRODUODENOSCOPY (EGD) WITH PROPOFOL N/A 08/11/2018   Procedure: ESOPHAGOGASTRODUODENOSCOPY (EGD) WITH PROPOFOL;  Surgeon: Lin Landsman, MD;  Location: [facility]  ENDOSCOPY;  Service: Gastroenterology;  Laterality: N/A;  . ESSURE TUBAL LIGATION    . INSERT / REPLACE / REMOVE PACEMAKER    . PACEMAKER INSERTION N/A 12/22/2015   Procedure: INSERTION PACEMAKER;  Surgeon: Isaias Cowman, MD;  Location: [facility] ORS;  Service: Cardiovascular;  Laterality: N/A;    Current Outpatient Medications:  .  acetaminophen (TYLENOL) 325 MG tablet, Take 650 mg by mouth every 6 (six) hours as needed., Disp: , Rfl:  .  cyanocobalamin (,VITAMIN B-12,) 1000 MCG/ML injection, Inject 1 mL (1,000 mcg total) into the skin once a week for 10 doses. Weekly for 4 weeks then alternate week for another 4weeks then once every 4weeks, Disp: 10 mL, Rfl: 0 .  ergocalciferol (VITAMIN D2) 50000 units capsule, Take 50,000 Units by mouth once a week., Disp: , Rfl:  .  ferrous sulfate 325 (65 FE) MG EC tablet, Take 1 tablet (325 mg total) by mouth 3 (three) times daily with meals., Disp: 90 tablet, Rfl: 1 .  Fluticasone-Salmeterol (WIXELA INHUB) 250-50 MCG/DOSE AEPB, Inhale 1 puff into the lungs 2 (two) times daily., Disp: , Rfl:  .  furosemide (LASIX) 40 MG tablet, Take 1 tablet (40 mg total) by mouth daily., Disp: 30 tablet, Rfl: 5 .  insulin glargine (LANTUS) 100 UNIT/ML injection, Inject 50 Units into the skin daily., Disp: , Rfl:  .  Ipratropium-Albuterol (COMBIVENT RESPIMAT) 20-100 MCG/ACT AERS respimat, Inhale 1 puff into the lungs every 6 (six) hours., Disp: , Rfl:  .  ipratropium-albuterol (DUONEB) 0.5-2.5 (3) MG/3ML SOLN, Take 3 mLs by nebulization every 6 (six) hours as needed., Disp: 360 mL, Rfl: 1 .  JANUMET XR 50-1000 MG TB24, TK 1 T PO BID, Disp: , Rfl:  .  losartan (COZAAR) 100 MG tablet, Take 100 mg by mouth daily., Disp: , Rfl:  .  metoprolol (LOPRESSOR) 50 MG tablet, Take 50 mg by mouth 2 (two) times daily., Disp: , Rfl:  .  rosuvastatin (CRESTOR) 10 MG tablet, Take 10 mg by mouth daily., Disp: , Rfl:  .  Saxagliptin-Metformin (KOMBIGLYZE XR) 2.05-998 MG TB24, Take 1 tablet  by mouth 2 (two) times daily., Disp: , Rfl:  .  WIXELA INHUB 250-50 MCG/DOSE AEPB, INHALE 1 PUFF INTO THE LUNGS TWICE DAILY, Disp: 60 each, Rfl: 11    Family History  Problem Relation Age of Onset  . Diabetes Mother   . Heart disease Mother      Social History   Tobacco Use  . Smoking status: Former Smoker    Packs/day: 0.25    Years: 35.00    Pack years: 8.75    Types: Cigarettes    Quit date: 12/21/2015    Years since quitting: 2.8  . Smokeless tobacco: Never Used  Substance Use Topics  . Alcohol use: No  . Drug use: No    Allergies as of 10/13/2018  . (No Known Allergies)    Review of Systems:    All systems reviewed and negative except where noted in HPI.   Physical Exam:  BP (!) 155/73 (BP Location: Left Arm, Patient Position: Sitting, Cuff Size: Large)   Pulse 73   Temp 98.5 F (36.9 C) (Oral)   Wt 253 lb 4 oz (114.9 kg)  BMI 44.86 kg/m  No LMP recorded. Patient is postmenopausal.  General:   Alert,  Well-developed, well-nourished, pleasant and cooperative in NAD Head:  Normocephalic and atraumatic. Eyes:  Sclera clear, no icterus.   Conjunctiva pink. Ears:  Normal auditory acuity. Nose:  No deformity, discharge, or lesions. Mouth:  No deformity or lesions,oropharynx pink & moist. Neck:  Supple; no masses or thyromegaly. Lungs:  Respirations even and unlabored.  Clear throughout to auscultation.   No wheezes, crackles, or rhonchi. No acute distress. Heart:  Regular rate and rhythm; no murmurs, clicks, rubs, or gallops. Abdomen:  Normal bowel sounds. Soft, obese, non-tender and non-distended without masses, hepatosplenomegaly or hernias noted.  No guarding or rebound tenderness.   Rectal: Not performed Msk:  Symmetrical without gross deformities. Good, equal movement & strength bilaterally. Pulses:  Normal pulses noted. Extremities:  No clubbing or edema.  No cyanosis. Neurologic:  Alert and oriented x3;  grossly normal neurologically. Skin:  Intact  without significant lesions or rashes. No jaundice. Psych:  Alert and cooperative. Normal mood and affect.  Imaging Studies: None  Assessment and Plan:   Lorraine Boone is a 68 y.o. 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 Tasia Catchings as needed for IV iron  Metabolic syndrome Recheck LFTs Counseled her about management of diabetes and hyperlipidemia, she is at risk for fatty liver Check viral hepatitis panel  Follow up in 3 months   Cephas Darby, MD

## 2018-10-13 NOTE — Patient Instructions (Signed)
Foods rich in iron and B12 include: Red meat, pork and poultry. Seafood. Beans. Milk eggs, salmon, Tuna Fortified cereal Dark green leafy vegetables, such as spinach. Dried fruit, such as raisins and apricots. Iron-fortified cereals, breads and pastas. Peas.

## 2018-10-14 ENCOUNTER — Encounter: Payer: Self-pay | Admitting: Oncology

## 2018-10-14 ENCOUNTER — Other Ambulatory Visit: Payer: Self-pay

## 2018-10-14 ENCOUNTER — Inpatient Hospital Stay: Payer: Medicare Other | Attending: Oncology

## 2018-10-14 ENCOUNTER — Inpatient Hospital Stay (HOSPITAL_BASED_OUTPATIENT_CLINIC_OR_DEPARTMENT_OTHER): Payer: Medicare Other | Admitting: Oncology

## 2018-10-14 VITALS — BP 163/79 | HR 67 | Temp 98.2°F | Resp 18 | Wt 253.4 lb

## 2018-10-14 DIAGNOSIS — Z87891 Personal history of nicotine dependence: Secondary | ICD-10-CM | POA: Diagnosis not present

## 2018-10-14 DIAGNOSIS — Z79899 Other long term (current) drug therapy: Secondary | ICD-10-CM | POA: Insufficient documentation

## 2018-10-14 DIAGNOSIS — I10 Essential (primary) hypertension: Secondary | ICD-10-CM | POA: Diagnosis not present

## 2018-10-14 DIAGNOSIS — Z7951 Long term (current) use of inhaled steroids: Secondary | ICD-10-CM | POA: Insufficient documentation

## 2018-10-14 DIAGNOSIS — M1990 Unspecified osteoarthritis, unspecified site: Secondary | ICD-10-CM | POA: Diagnosis not present

## 2018-10-14 DIAGNOSIS — J449 Chronic obstructive pulmonary disease, unspecified: Secondary | ICD-10-CM | POA: Insufficient documentation

## 2018-10-14 DIAGNOSIS — E785 Hyperlipidemia, unspecified: Secondary | ICD-10-CM | POA: Diagnosis not present

## 2018-10-14 DIAGNOSIS — R197 Diarrhea, unspecified: Secondary | ICD-10-CM | POA: Diagnosis not present

## 2018-10-14 DIAGNOSIS — E538 Deficiency of other specified B group vitamins: Secondary | ICD-10-CM | POA: Insufficient documentation

## 2018-10-14 DIAGNOSIS — R740 Nonspecific elevation of levels of transaminase and lactic acid dehydrogenase [LDH]: Secondary | ICD-10-CM | POA: Diagnosis not present

## 2018-10-14 DIAGNOSIS — D500 Iron deficiency anemia secondary to blood loss (chronic): Secondary | ICD-10-CM

## 2018-10-14 DIAGNOSIS — R5383 Other fatigue: Secondary | ICD-10-CM | POA: Diagnosis not present

## 2018-10-14 DIAGNOSIS — Z794 Long term (current) use of insulin: Secondary | ICD-10-CM | POA: Insufficient documentation

## 2018-10-14 LAB — CBC
Hematocrit: 35.9 % (ref 34.0–46.6)
Hemoglobin: 11.4 g/dL (ref 11.1–15.9)
MCH: 26.9 pg (ref 26.6–33.0)
MCHC: 31.8 g/dL (ref 31.5–35.7)
MCV: 85 fL (ref 79–97)
Platelets: 240 10*3/uL (ref 150–450)
RBC: 4.24 x10E6/uL (ref 3.77–5.28)
RDW: 20.5 % — ABNORMAL HIGH (ref 11.7–15.4)
WBC: 8.2 10*3/uL (ref 3.4–10.8)

## 2018-10-14 LAB — IRON AND TIBC
Iron Saturation: 22 % (ref 15–55)
Iron: 71 ug/dL (ref 27–139)
Total Iron Binding Capacity: 327 ug/dL (ref 250–450)
UIBC: 256 ug/dL (ref 118–369)

## 2018-10-14 LAB — FERRITIN: Ferritin: 133 ng/mL (ref 15–150)

## 2018-10-14 LAB — HEPATITIS C ANTIBODY: Hep C Virus Ab: <0.1 s/co ratio (ref 0.0–0.9)

## 2018-10-14 LAB — HEPATIC FUNCTION PANEL
ALT: 25 IU/L (ref 0–32)
AST: 41 IU/L — ABNORMAL HIGH (ref 0–40)
Albumin: 4.1 g/dL (ref 3.8–4.8)
Alkaline Phosphatase: 82 IU/L (ref 39–117)
Bilirubin Total: 0.3 mg/dL (ref 0.0–1.2)
Bilirubin, Direct: 0.14 mg/dL (ref 0.00–0.40)
Total Protein: 6.7 g/dL (ref 6.0–8.5)

## 2018-10-14 LAB — VITAMIN B12: Vitamin B-12: 374 pg/mL (ref 232–1245)

## 2018-10-14 LAB — HEPATITIS A ANTIBODY, TOTAL: hep A Total Ab: NEGATIVE

## 2018-10-14 LAB — HEPATITIS B SURFACE ANTIBODY,QUALITATIVE: Hep B Surface Ab, Qual: NONREACTIVE

## 2018-10-14 LAB — HEPATITIS B SURFACE ANTIGEN: Hepatitis B Surface Ag: NEGATIVE

## 2018-10-14 LAB — HEPATITIS B CORE ANTIBODY, TOTAL: Hep B Core Total Ab: NEGATIVE

## 2018-10-14 NOTE — Progress Notes (Signed)
Patient does not offer any problems today.  

## 2018-10-14 NOTE — Progress Notes (Signed)
Hematology/Oncology Consult note [facility] Telephone:([phone] [phone]   Patient Care Team: Casilda Carls, MD as PCP - General  REFERRING PROVIDER: Casilda Carls, MD CHIEF COMPLAINTS/REASON FOR VISIT:  Evaluation of iron deficiency anemia  HISTORY OF PRESENTING ILLNESS:  Lorraine Boone is a  68 y.o.  female with PMH listed below was seen in consultation at the request of Casilda Carls, MD   for evaluation of iron deficiency anemia.   Reviewed patient's recent labs  07/14/2018 labs revealed anemia with hemoglobin of 8.3.  MCV 77.7, white count 6.9, platelet count 337,000 LFT shows slightly elevated AST at 44, ALT 18, bilirubin 0.3, alkaline phosphatase 85. Iron panel showed TIBC 445, iron saturation 6, ferritin 11.  Folate 9.5, vitamin B12 125.  Patient has not had any CBC results available in current EMR.  Associated signs and symptoms: Patient reports fatigue.  Admits to SOB with exertion.  Denies weight loss, easy bruising, hematochezia, hemoptysis, hematuria. Context:  History of iron deficiency: Denies Rectal bleeding: Denies Menstrual bleeding/ Vaginal bleeding : Denies Hematemesis or hemoptysis : denies Blood in urine : denies  Last endoscopy: 08/11/2018  Colonoscopy the examined portion of the ileum was normal. - One 7 mm polyp in the ascending colon, removed with a hot snare. Resected and retrieved.- One 5 mm polyp in the transverse colon, removed with a cold snare. Resected and retrieved.- Severe diverticulosis in the entire examined colon. There was no evidence of diverticular bleeding.- The distal rectum and anal verge are normal on retroflexion view. Upper endoscopy Normal duodenal bulb and second portion of the duodenum. Biopsied. - Gastric mucosal atrophy. Biopsied. - Normal antrum. Biopsied. - Esophagogastric landmarks identified. - Normal gastroesophageal junction and esophagus  Duodenal biopsy negative for villous  atrophy, increased intraepithelial lymphocytes and organisms Stomach random biopsy shows benign gastric mucosa with foveolar hyperplastic, mild chronic gastritis and focal intestinal metaplasia.  Negative for dysplasia or malignancy. Colon polyps showed tubular villous adenoma, negative for high-grade dysplasia and malignancy.  Patient is not on blood thinners.  Currently she takes oral iron supplementation.  Fatigue: Yes.  SOB: deneis  INTERVAL HISTORY Lorraine Boone is a 68 y.o. female who has above history reviewed by me today presents for follow up visit for management of iron deficiency anemia. Problems and complaints are listed below: Patient was last seen in 08/14/2018.  At that time IV iron infusion was recommended and patient prefers to defer and to try oral iron supplementation first. Then she changed her mind and got IV Venofer weekly x4. She takes oral iron supplementation 325 mg 3 times daily and patient reports tolerating well.  No constipation.  Reports that her stool is chronically loose. Patient was on vitamin B12 injections which were started by Dr. Marius Ditch  Patient follows up with Dr. Marius Ditch for autoimmune atrophic gastritis. Note was  reviewed  Review of Systems  Constitutional: Positive for fatigue. Negative for appetite change, chills and fever.  HENT:   Negative for hearing loss and voice change.   Eyes: Negative for eye problems.  Respiratory: Negative for chest tightness and cough.   Cardiovascular: Negative for chest pain.  Gastrointestinal: Negative for abdominal distention, abdominal pain and blood in stool.  Endocrine: Negative for hot flashes.  Genitourinary: Negative for difficulty urinating and frequency.   Musculoskeletal: Negative for arthralgias.  Skin: Negative for itching and rash.  Neurological: Negative for extremity weakness.  Hematological: Negative for adenopathy.  Psychiatric/Behavioral: Negative for confusion.    MEDICAL HISTORY:  Past  Medical  History:  Diagnosis Date  . Arthritis   . Bradycardia   . COPD (chronic obstructive pulmonary disease) ([place])   . Diabetes mellitus type 2, uncomplicated ([place])   . Dyspnea   . Hyperlipidemia   . Hypertension   . Intermittent complete heart block ([place])   . Iron deficiency anemia due to chronic blood loss 08/18/2018    SURGICAL HISTORY: Past Surgical History:  Procedure Laterality Date  . CHOLECYSTECTOMY    . COLONOSCOPY WITH PROPOFOL N/A 08/11/2018   Procedure: COLONOSCOPY WITH PROPOFOL;  Surgeon: Lin Landsman, MD;  Location: [facility] ENDOSCOPY;  Service: Gastroenterology;  Laterality: N/A;  . ESOPHAGOGASTRODUODENOSCOPY (EGD) WITH PROPOFOL N/A 08/11/2018   Procedure: ESOPHAGOGASTRODUODENOSCOPY (EGD) WITH PROPOFOL;  Surgeon: Lin Landsman, MD;  Location: [facility] ENDOSCOPY;  Service: Gastroenterology;  Laterality: N/A;  . ESSURE TUBAL LIGATION    . INSERT / REPLACE / REMOVE PACEMAKER    . PACEMAKER INSERTION N/A 12/22/2015   Procedure: INSERTION PACEMAKER;  Surgeon: Isaias Cowman, MD;  Location: [facility] ORS;  Service: Cardiovascular;  Laterality: N/A;    SOCIAL HISTORY: Social History   Socioeconomic History  . Marital status: Unknown    Spouse name: Not on file  . Number of children: Not on file  . Years of education: Not on file  . Highest education level: Not on file  Occupational History  . Not on file  Social Needs  . Financial resource strain: Not on file  . Food insecurity    Worry: Not on file    Inability: Not on file  . Transportation needs    Medical: Not on file    Non-medical: Not on file  Tobacco Use  . Smoking status: Former Smoker    Packs/day: 0.25    Years: 35.00    Pack years: 8.75    Types: Cigarettes    Quit date: 12/21/2015    Years since quitting: 2.8  . Smokeless tobacco: Never Used  Substance and Sexual Activity  . Alcohol use: No  . Drug use: No  . Sexual activity: Not on file  Lifestyle  . Physical activity    Days per week:  Not on file    Minutes per session: Not on file  . Stress: Not on file  Relationships  . Social Herbalist on phone: Not on file    Gets together: Not on file    Attends religious service: Not on file    Active member of club or organization: Not on file    Attends meetings of clubs or organizations: Not on file    Relationship status: Not on file  . Intimate partner violence    Fear of current or ex partner: Not on file    Emotionally abused: Not on file    Physically abused: Not on file    Forced sexual activity: Not on file  Other Topics Concern  . Not on file  Social History Narrative  . Not on file    FAMILY HISTORY: Family History  Problem Relation Age of Onset  . Diabetes Mother   . Heart disease Mother     ALLERGIES:  has No Known Allergies.  MEDICATIONS:  Current Outpatient Medications  Medication Sig Dispense Refill  . acetaminophen (TYLENOL) 325 MG tablet Take 650 mg by mouth every 6 (six) hours as needed.    . cyanocobalamin (,VITAMIN B-12,) 1000 MCG/ML injection Inject 1 mL (1,000 mcg total) into the skin once a week for 10 doses. Weekly  for 4 weeks then alternate week for another 4weeks then once every 4weeks 10 mL 0  . ergocalciferol (VITAMIN D2) 50000 units capsule Take 50,000 Units by mouth once a week.    . ferrous sulfate 325 (65 FE) MG EC tablet Take 1 tablet (325 mg total) by mouth 3 (three) times daily with meals. 90 tablet 1  . Fluticasone-Salmeterol (WIXELA INHUB) 250-50 MCG/DOSE AEPB Inhale 1 puff into the lungs 2 (two) times daily.    . furosemide (LASIX) 40 MG tablet Take 1 tablet (40 mg total) by mouth daily. 30 tablet 5  . insulin glargine (LANTUS) 100 UNIT/ML injection Inject 50 Units into the skin daily.    . Ipratropium-Albuterol (COMBIVENT RESPIMAT) 20-100 MCG/ACT AERS respimat Inhale 1 puff into the lungs every 6 (six) hours.    Marland Kitchen ipratropium-albuterol (DUONEB) 0.5-2.5 (3) MG/3ML SOLN Take 3 mLs by nebulization every 6 (six) hours  as needed. 360 mL 1  . JANUMET XR 50-1000 MG TB24 TK 1 T PO BID    . losartan (COZAAR) 100 MG tablet Take 100 mg by mouth daily.    . metoprolol (LOPRESSOR) 50 MG tablet Take 50 mg by mouth 2 (two) times daily.    . rosuvastatin (CRESTOR) 10 MG tablet Take 10 mg by mouth daily.    Grant Ruts INHUB 250-50 MCG/DOSE AEPB INHALE 1 PUFF INTO THE LUNGS TWICE DAILY 60 each 11  . Saxagliptin-Metformin (KOMBIGLYZE XR) 2.05-998 MG TB24 Take 1 tablet by mouth 2 (two) times daily.     No current facility-administered medications for this visit.      PHYSICAL EXAMINATION: ECOG PERFORMANCE STATUS: 1 - Symptomatic but completely ambulatory Vitals:   10/14/18 1340  BP: (!) 163/79  Pulse: 67  Resp: 18  Temp: 98.2 F (36.8 C)   Filed Weights   10/14/18 1340  Weight: 253 lb 6.4 oz (114.9 kg)    Physical Exam Constitutional:      General: She is not in acute distress.    Appearance: She is obese.  HENT:     Head: Normocephalic and atraumatic.  Eyes:     General: No scleral icterus.    Pupils: Pupils are equal, round, and reactive to light.  Neck:     Musculoskeletal: Normal range of motion and neck supple.  Cardiovascular:     Rate and Rhythm: Normal rate and regular rhythm.     Heart sounds: Normal heart sounds.     Comments: Patient has a pacemaker Pulmonary:     Effort: Pulmonary effort is normal. No respiratory distress.     Breath sounds: No wheezing.  Abdominal:     General: Bowel sounds are normal. There is no distension.     Palpations: Abdomen is soft. There is no mass.     Tenderness: There is no abdominal tenderness.  Musculoskeletal: Normal range of motion.        General: No deformity.  Skin:    General: Skin is warm and dry.     Coloration: Skin is not pale.     Findings: No erythema or rash.  Neurological:     Mental Status: She is alert and oriented to person, place, and time.     Cranial Nerves: No cranial nerve deficit.     Coordination: Coordination normal.   Psychiatric:        Behavior: Behavior normal.        Thought Content: Thought content normal.       CMP Latest Ref Rng & Units  10/13/2018  Glucose 65 - 99 mg/dL -  BUN 6 - 20 mg/dL -  Creatinine 0.44 - 1.00 mg/dL -  Sodium 135 - 145 mmol/L -  Potassium 3.5 - 5.1 mmol/L -  Chloride 101 - 111 mmol/L -  CO2 22 - 32 mmol/L -  Calcium 8.9 - 10.3 mg/dL -  Total Protein 6.0 - 8.5 g/dL 6.7  Total Bilirubin 0.0 - 1.2 mg/dL 0.3  Alkaline Phos 39 - 117 IU/L 82  AST 0 - 40 IU/L 41(H)  ALT 0 - 32 IU/L 25   CBC Latest Ref Rng & Units 10/13/2018  WBC 3.4 - 10.8 x10E3/uL 8.2  Hemoglobin 11.1 - 15.9 g/dL 11.4  Hematocrit 34.0 - 46.6 % 35.9  Platelets 150 - 450 x10E3/uL 240     LABORATORY DATA:  I have reviewed the data as listed Lab Results  Component Value Date   WBC 8.2 10/13/2018   HGB 11.4 10/13/2018   HCT 35.9 10/13/2018   MCV 85 10/13/2018   PLT 240 10/13/2018   Recent Labs    07/14/18 1447 10/13/18 1444  PROT 6.4 6.7  ALBUMIN 4.0 4.1  AST 44* 41*  ALT 18 25  ALKPHOS 85 82  BILITOT 0.3 0.3  BILIDIR 0.13 0.14   Iron/TIBC/Ferritin/ %Sat    Component Value Date/Time   IRON 71 10/13/2018 1444   TIBC 327 10/13/2018 1444   FERRITIN 133 10/13/2018 1444   IRONPCTSAT 22 10/13/2018 1444     No results found.    ASSESSMENT & PLAN:  1. Iron deficiency anemia due to chronic blood loss   Iron deficiency #Labs are reviewed and discussed with patient. Status post IV Venofer weekly x4.  Currently on oral iron supplementation 3 times a day.  Tolerating well. Labs have shown improvement of iron store as well as improve of hemoglobin level.  Hold additional IV iron at this point.  Continue oral iron supplementation.   #Vitamin B12 deficiency, status post vitamin B12 injections.  B12 has improved to 374. Continue to monitor. Follow-up in 6 months.  Orders Placed This Encounter  Procedures  . CBC with Differential/Platelet    Standing Status:   Future    Standing  Expiration Date:   10/14/2019  . Ferritin    Standing Status:   Future    Standing Expiration Date:   10/14/2019  . Iron and TIBC    Standing Status:   Future    Standing Expiration Date:   10/14/2019    All questions were answered. The patient knows to call the clinic with any problems questions or concerns.  Cc Casilda Carls, MD  Return of visit: 6 months.     Earlie Server, MD, PhD Hematology Oncology [place] at [facility] 10/14/2018

## 2018-10-15 ENCOUNTER — Ambulatory Visit: Payer: Medicare Other | Admitting: Gastroenterology

## 2018-10-27 ENCOUNTER — Other Ambulatory Visit: Payer: Medicare Other

## 2018-10-29 ENCOUNTER — Ambulatory Visit: Payer: Medicare Other | Admitting: Oncology

## 2018-10-29 ENCOUNTER — Ambulatory Visit: Payer: Medicare Other

## 2018-12-25 ENCOUNTER — Ambulatory Visit: Payer: Medicare Other | Admitting: Gastroenterology

## 2018-12-25 ENCOUNTER — Encounter (INDEPENDENT_AMBULATORY_CARE_PROVIDER_SITE_OTHER): Payer: Self-pay

## 2018-12-25 ENCOUNTER — Other Ambulatory Visit: Payer: Self-pay

## 2018-12-25 ENCOUNTER — Encounter: Payer: Self-pay | Admitting: Gastroenterology

## 2018-12-25 VITALS — BP 145/67 | HR 70 | Temp 97.8°F | Wt 252.0 lb

## 2018-12-25 DIAGNOSIS — K2940 Chronic atrophic gastritis without bleeding: Secondary | ICD-10-CM | POA: Diagnosis not present

## 2018-12-25 DIAGNOSIS — R7401 Elevation of levels of liver transaminase levels: Secondary | ICD-10-CM | POA: Diagnosis not present

## 2018-12-25 DIAGNOSIS — D510 Vitamin B12 deficiency anemia due to intrinsic factor deficiency: Secondary | ICD-10-CM

## 2018-12-25 NOTE — Progress Notes (Signed)
Cephas Darby, MD [address]  [place]  [place], [place] [address]  Main: [phone]  Fax: [phone]    Gastroenterology Consultation  Referring Provider:     Casilda Carls, MD Primary Care Physician:  Casilda Carls, MD Primary Gastroenterologist:  Dr. Cephas Darby Reason for Consultation: Iron deficiency anemia        HPI:   Lorraine Boone is a 68 y.o. female referred by Dr. Casilda Carls, MD  for consultation & management of microcytic anemia, new onset.  Patient has history of metabolic syndrome, complete heart block, status post dual-chamber pacemaker is referred for evaluation of new onset of microcytic anemia.  Patient had labs from outside in 04/2018 which revealed hemoglobin 9.1, MCV 78.5, normal platelets.  She is also FIT positive in 02/2017.  Her hemoglobin A1c 8.3, she is taking oral iron 1 pill a day, started 2 weeks ago.  She is accompanied by her husband today.  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  Patient received parenteral iron infusion as well as monthly B12 subcutaneous injections.  She reports her energy levels are better.  She has been not doing well in her personal life due to deaths in her family.  She is trying to cope up and doing okay.  Currently, she is doing monthly B12 shots.  Follow-up visit 12/25/2018  Patient reports doing well overall. She continues to take oral iron but skipping doses here and there. She reports having dark stools when she is on oral iron. She does not have any GI complaints today.  NSAIDs: None  Antiplts/Anticoagulants/Anti thrombotics: None  GI Procedures:   DIAGNOSIS:  EGD and colonoscopy 08/11/2018 - Normal duodenal bulb and second portion of the  duodenum. Biopsied. - Gastric mucosal atrophy. Biopsied. - Normal antrum. Biopsied. - Esophagogastric landmarks identified. - Normal gastroesophageal junction and esophagus.  - The examined portion of the ileum was normal. - One 7 mm polyp in the ascending colon, removed with a hot snare. Resected and retrieved. - One 5 mm polyp in the transverse colon, removed with a cold snare. Resected and retrieved. - Severe diverticulosis in the entire examined colon. There was no evidence of diverticular bleeding. - The distal rectum and anal verge are normal on retroflexion view.   A. DUODENUM; BIOPSY:  -NEGATIVE FOR VILLOUS ATROPHY, INCREASED INTRAEPITHELIAL LYMPHOCYTES AND  ORGANISMS.   B. STOMACH, RANDOM; BIOPSIES:  -BENIGN GASTRIC MUCOSA WITH FOVEOLAR HYPERPLASIA, MILD CHRONIC GASTRITIS  AND FOCAL INTESTINAL METAPLASIA.  -NEGATIVE FOR H. PYLORI, DYSPLASIA AND MALIGNANCY.  SEE COMMENT.   COMMENT.  No parietal cells are identified. If some of these random biopsies were  taken from the body of the stomach, these may represent atrophic  gastritis.  She denies family history of GI malignancy  C. COLON POLYP, ASCENDING; BIOPSY:  - TUBULOVILLOUS ADENOMA (MULTIPLE FRAGMENTS).  - NEGATIVE FOR HIGH-GRADE DYSPLASIA AND MALIGNANCY.   D. COLON POLYP, TRANSVERSE; BIOPSY:  - TUBULAR ADENOMA.  - NEGATIVE FOR HIGH-GRADE DYSPLASIA AND MALIGNANCY.   Past Medical History:  Diagnosis Date  . Arthritis   . Bradycardia   . COPD (chronic obstructive pulmonary disease) ([place])   . Diabetes mellitus type 2, uncomplicated ([place])   . Dyspnea   . Hyperlipidemia   . Hypertension   .  Intermittent complete heart block ([place])   . Iron deficiency anemia due to chronic blood loss 08/18/2018    Past Surgical History:  Procedure Laterality Date  . CHOLECYSTECTOMY    . COLONOSCOPY WITH PROPOFOL N/A 08/11/2018   Procedure: COLONOSCOPY WITH PROPOFOL;  Surgeon: Lin Landsman, MD;  Location: [facility] ENDOSCOPY;   Service: Gastroenterology;  Laterality: N/A;  . ESOPHAGOGASTRODUODENOSCOPY (EGD) WITH PROPOFOL N/A 08/11/2018   Procedure: ESOPHAGOGASTRODUODENOSCOPY (EGD) WITH PROPOFOL;  Surgeon: Lin Landsman, MD;  Location: [facility] ENDOSCOPY;  Service: Gastroenterology;  Laterality: N/A;  . ESSURE TUBAL LIGATION    . INSERT / REPLACE / REMOVE PACEMAKER    . PACEMAKER INSERTION N/A 12/22/2015   Procedure: INSERTION PACEMAKER;  Surgeon: Isaias Cowman, MD;  Location: [facility] ORS;  Service: Cardiovascular;  Laterality: N/A;    Current Outpatient Medications:  .  acetaminophen (TYLENOL) 325 MG tablet, Take 650 mg by mouth every 6 (six) hours as needed., Disp: , Rfl:  .  cyanocobalamin (,VITAMIN B-12,) 1000 MCG/ML injection, Inject 1,000 mcg into the muscle every 30 (thirty) days., Disp: , Rfl:  .  ergocalciferol (VITAMIN D2) 50000 units capsule, Take 50,000 Units by mouth once a week., Disp: , Rfl:  .  ferrous sulfate 325 (65 FE) MG EC tablet, Take 1 tablet (325 mg total) by mouth 3 (three) times daily with meals., Disp: 90 tablet, Rfl: 1 .  Fluticasone-Salmeterol (WIXELA INHUB) 250-50 MCG/DOSE AEPB, Inhale 1 puff into the lungs 2 (two) times daily., Disp: , Rfl:  .  furosemide (LASIX) 40 MG tablet, Take 1 tablet (40 mg total) by mouth daily., Disp: 30 tablet, Rfl: 5 .  insulin glargine (LANTUS) 100 UNIT/ML injection, Inject 50 Units into the skin daily., Disp: , Rfl:  .  Ipratropium-Albuterol (COMBIVENT RESPIMAT) 20-100 MCG/ACT AERS respimat, Inhale 1 puff into the lungs every 6 (six) hours., Disp: , Rfl:  .  ipratropium-albuterol (DUONEB) 0.5-2.5 (3) MG/3ML SOLN, Take 3 mLs by nebulization every 6 (six) hours as needed., Disp: 360 mL, Rfl: 1 .  JANUMET XR 50-1000 MG TB24, TK 1 T PO BID, Disp: , Rfl:  .  losartan (COZAAR) 100 MG tablet, Take 100 mg by mouth daily., Disp: , Rfl:  .  metoprolol (LOPRESSOR) 50 MG tablet, Take 50 mg by mouth 2 (two) times daily., Disp: , Rfl:  .  rosuvastatin (CRESTOR) 10 MG  tablet, Take 10 mg by mouth daily., Disp: , Rfl:  .  Saxagliptin-Metformin (KOMBIGLYZE XR) 2.05-998 MG TB24, Take 1 tablet by mouth 2 (two) times daily., Disp: , Rfl:     Family History  Problem Relation Age of Onset  . Diabetes Mother   . Heart disease Mother      Social History   Tobacco Use  . Smoking status: Former Smoker    Packs/day: 0.25    Years: 35.00    Pack years: 8.75    Types: Cigarettes    Quit date: 12/21/2015    Years since quitting: 3.0  . Smokeless tobacco: Never Used  Substance Use Topics  . Alcohol use: No  . Drug use: No    Allergies as of 12/25/2018  . (No Known Allergies)    Review of Systems:    All systems reviewed and negative except where noted in HPI.   Physical Exam:  BP (!) 145/67 (BP Location: Left Arm, Patient Position: Sitting, Cuff Size: Large)   Pulse 70   Temp 97.8 F (36.6 C) (Oral)   Wt 252 lb (114.3 kg)   BMI  44.64 kg/m  No LMP recorded. Patient is postmenopausal.  General:   Alert,  Well-developed, well-nourished, pleasant and cooperative in NAD Head:  Normocephalic and atraumatic. Eyes:  Sclera clear, no icterus.   Conjunctiva pink. Ears:  Normal auditory acuity. Nose:  No deformity, discharge, or lesions. Mouth:  No deformity or lesions,oropharynx pink & moist. Neck:  Supple; no masses or thyromegaly. Lungs:  Respirations even and unlabored.  Clear throughout to auscultation.   No wheezes, crackles, or rhonchi. No acute distress. Heart:  Regular rate and rhythm; no murmurs, clicks, rubs, or gallops. Abdomen:  Normal bowel sounds. Soft, obese, non-tender and non-distended without masses, hepatosplenomegaly or hernias noted.  No guarding or rebound tenderness.   Rectal: Not performed Msk:  Symmetrical without gross deformities. Good, equal movement & strength bilaterally. Pulses:  Normal pulses noted. Extremities:  No clubbing or edema.  No cyanosis. Neurologic:  Alert and oriented x3;  grossly normal neurologically.  Skin:  Intact without significant lesions or rashes. No jaundice. Psych:  Alert and cooperative. Normal mood and affect.  Imaging Studies: None  Assessment and Plan:   Lorraine Boone is a 68 y.o. 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 Guerry Bruin office last week, will obtain a copy Suggested her to decrease oral iron intake to once a day only  Follow-up with Dr. Tasia Catchings as needed for IV iron  Metabolic syndrome She has mildly elevated ALT. Discussed with her about fatty liver and recommended abdominal ultrasound. She prefers not to undergo ultrasound at this time to avoid exposure to Covid-19.  Acute viral hepatitis panel is negative   Follow up in 3 months   Cephas Darby, MD

## 2018-12-29 ENCOUNTER — Other Ambulatory Visit: Payer: Self-pay

## 2018-12-29 ENCOUNTER — Encounter: Payer: Self-pay | Admitting: Internal Medicine

## 2018-12-29 ENCOUNTER — Ambulatory Visit (INDEPENDENT_AMBULATORY_CARE_PROVIDER_SITE_OTHER): Payer: Medicare Other | Admitting: Internal Medicine

## 2018-12-29 DIAGNOSIS — E669 Obesity, unspecified: Secondary | ICD-10-CM | POA: Diagnosis not present

## 2018-12-29 DIAGNOSIS — J449 Chronic obstructive pulmonary disease, unspecified: Secondary | ICD-10-CM

## 2018-12-29 DIAGNOSIS — J9611 Chronic respiratory failure with hypoxia: Secondary | ICD-10-CM

## 2018-12-29 NOTE — Patient Instructions (Signed)
MEDICATION ADJUSTMENTS/LABS AND TESTS ORDERED: Continue inhalers as prescribed Continue oxygen as prescribed

## 2018-12-29 NOTE — Progress Notes (Signed)
[facility] Consultation     Date: 12/29/2018,   MRN# [id number] Lorraine Boone 1950/04/13   BRIEF PATIENT DESCRIPTION:  This is a 68 yo female s/p pacemaker placement who developed acute hypoxic respiratory failure likely secondary to undiagnosed COPD requiring Bipap post procedure.  SIGNIFICANT EVENTS  11/30-s/p pacemaker placement developed acute respiratory failure likely secondary to undiagnosed COPD requiring Bipap 11/30-PCCM consulted for management of acute respiratory failure requiring Bipap post procedure 11/30-Pt transitioned off Bipap to nasal canula  12/1 d/c cancelled for increased WOB  -68 yo female with a PMH of Intermittent Complete Heart Block, HTN, Hyperlipidemia, Type II DM, Bradycardia, Arthritis, and Current everyday smoker (36 yr hx-currently smokes 5 cigarettes per day) -She presented to [facility] 11/30 for elective pacemaker placement.  - Following procedure she developed acute respiratory failure likely secondary to undiagnosed COPD requiring Bipap.   -PCCM consulted 11/30 for additional management.    office Arlyce Harman Ratio 78% fev1 74% predicted Flow volume curve shows slight concavity at end of expiration No desats with ambulating Pulse ox                 Current  Lorraine Boone is a 68 y.o. old female seen in consultation for COPD at the request of Dr. Posey Pronto.     CHIEF COMPLAINT: follow up COPD Follow up hypoxic resp failure    HPI COPD seems stable No fevers Patient uses and benefits from oxygen therapy and needs this to survive   Quit tobacco 3 years ago  No COPD exacerbation at this time No evidence of heart failure at this time No evidence or signs of infection at this time No respiratory distress No fevers, chills, nausea, vomiting, diarrhea No evidence of lower extremity edema No evidence hemoptysis     Current Outpatient Medications:  .  acetaminophen (TYLENOL) 325 MG tablet, Take 650 mg by mouth every 6 (six)  hours as needed., Disp: , Rfl:  .  cyanocobalamin (,VITAMIN B-12,) 1000 MCG/ML injection, Inject 1,000 mcg into the muscle every 30 (thirty) days., Disp: , Rfl:  .  ergocalciferol (VITAMIN D2) 50000 units capsule, Take 50,000 Units by mouth once a week., Disp: , Rfl:  .  ferrous sulfate 325 (65 FE) MG EC tablet, Take 1 tablet (325 mg total) by mouth 3 (three) times daily with meals., Disp: 90 tablet, Rfl: 1 .  Fluticasone-Salmeterol (WIXELA INHUB) 250-50 MCG/DOSE AEPB, Inhale 1 puff into the lungs 2 (two) times daily., Disp: , Rfl:  .  furosemide (LASIX) 40 MG tablet, Take 1 tablet (40 mg total) by mouth daily., Disp: 30 tablet, Rfl: 5 .  insulin glargine (LANTUS) 100 UNIT/ML injection, Inject 50 Units into the skin daily., Disp: , Rfl:  .  Ipratropium-Albuterol (COMBIVENT RESPIMAT) 20-100 MCG/ACT AERS respimat, Inhale 1 puff into the lungs every 6 (six) hours., Disp: , Rfl:  .  ipratropium-albuterol (DUONEB) 0.5-2.5 (3) MG/3ML SOLN, Take 3 mLs by nebulization every 6 (six) hours as needed., Disp: 360 mL, Rfl: 1 .  JANUMET XR 50-1000 MG TB24, TK 1 T PO BID, Disp: , Rfl:  .  losartan (COZAAR) 100 MG tablet, Take 100 mg by mouth daily., Disp: , Rfl:  .  metoprolol (LOPRESSOR) 50 MG tablet, Take 50 mg by mouth 2 (two) times daily., Disp: , Rfl:  .  rosuvastatin (CRESTOR) 10 MG tablet, Take 10 mg by mouth daily., Disp: , Rfl:  .  Saxagliptin-Metformin (KOMBIGLYZE XR) 2.05-998 MG TB24, Take 1 tablet by mouth 2 (two) times  daily., Disp: , Rfl:    There were no vitals taken for this visit.   LastLabs   Recent Labs Lab 12/23/15 0343  NA 139  K 3.6  CL 104  CO2 25  BUN 25*  CREATININE 1.35*  GLUCOSE 355*       REVIEW OF SYSTEMS   Review of Systems  Constitutional: Negative for chills, diaphoresis, fever, malaise/fatigue and weight loss.  HENT: Negative for congestion and hearing loss.   Eyes: Negative for blurred vision and double vision.  Respiratory: Positive for shortness of  breath. Negative for cough, hemoptysis, sputum production and wheezing.   Cardiovascular: Negative for chest pain, palpitations and orthopnea.  Gastrointestinal: Negative for heartburn, nausea and vomiting.  Skin: Negative for rash.  Neurological: Negative for weakness.  All other systems reviewed and are negative.     NO destats   ASSESSMENT/PLAN   68 year old morbidly obese white female with a history of extensive smoking history mild to moderate COPD Gold stage B with chronic hypoxic respiratory failure on oxygen therapy   No evidence of COPD exacerbation at this time COPD seems to be stable with ongoing inhaler therapy Continue inhalers as prescribed Wixela and albuterol as needed  Hypoxic respiratory failure chronic Continue oxygen as prescribed Patient uses and benefits from oxygen therapy and needs this for survival      COVID-19 EDUCATION: The signs and symptoms of COVID-19 were discussed with the patient and how to seek care for testing.  The importance of social distancing was discussed today. Hand Washing Techniques and avoid touching face was advised.     MEDICATION ADJUSTMENTS/LABS AND TESTS ORDERED: Continue inhalers as prescribed Continue oxygen as prescribed   CURRENT MEDICATIONS REVIEWED AT LENGTH WITH PATIENT TODAY   Patient satisfied with Plan of action and management. All questions answered  Follow up in 1 year  Total time spent 22 minutes  Lorraine Boone, M.D.  [facility]  Medical Director [place] Director [facility] Cardio-Pulmonary Department

## 2019-01-24 ENCOUNTER — Other Ambulatory Visit: Payer: Self-pay | Admitting: Gastroenterology

## 2019-04-13 ENCOUNTER — Inpatient Hospital Stay (HOSPITAL_BASED_OUTPATIENT_CLINIC_OR_DEPARTMENT_OTHER): Payer: Medicare Other | Admitting: Oncology

## 2019-04-13 ENCOUNTER — Other Ambulatory Visit: Payer: Self-pay

## 2019-04-13 ENCOUNTER — Encounter: Payer: Self-pay | Admitting: Oncology

## 2019-04-13 ENCOUNTER — Inpatient Hospital Stay: Payer: Medicare Other | Attending: Oncology

## 2019-04-13 VITALS — BP 172/80 | HR 59 | Temp 94.7°F | Resp 18 | Wt 249.8 lb

## 2019-04-13 DIAGNOSIS — M1990 Unspecified osteoarthritis, unspecified site: Secondary | ICD-10-CM | POA: Insufficient documentation

## 2019-04-13 DIAGNOSIS — I10 Essential (primary) hypertension: Secondary | ICD-10-CM | POA: Insufficient documentation

## 2019-04-13 DIAGNOSIS — Z794 Long term (current) use of insulin: Secondary | ICD-10-CM | POA: Diagnosis not present

## 2019-04-13 DIAGNOSIS — E785 Hyperlipidemia, unspecified: Secondary | ICD-10-CM | POA: Diagnosis not present

## 2019-04-13 DIAGNOSIS — Z79899 Other long term (current) drug therapy: Secondary | ICD-10-CM | POA: Diagnosis not present

## 2019-04-13 DIAGNOSIS — E119 Type 2 diabetes mellitus without complications: Secondary | ICD-10-CM | POA: Insufficient documentation

## 2019-04-13 DIAGNOSIS — E538 Deficiency of other specified B group vitamins: Secondary | ICD-10-CM

## 2019-04-13 DIAGNOSIS — R7401 Elevation of levels of liver transaminase levels: Secondary | ICD-10-CM | POA: Insufficient documentation

## 2019-04-13 DIAGNOSIS — D500 Iron deficiency anemia secondary to blood loss (chronic): Secondary | ICD-10-CM

## 2019-04-13 DIAGNOSIS — Z87891 Personal history of nicotine dependence: Secondary | ICD-10-CM | POA: Insufficient documentation

## 2019-04-13 DIAGNOSIS — R5383 Other fatigue: Secondary | ICD-10-CM | POA: Insufficient documentation

## 2019-04-13 DIAGNOSIS — Z7951 Long term (current) use of inhaled steroids: Secondary | ICD-10-CM | POA: Insufficient documentation

## 2019-04-13 DIAGNOSIS — J449 Chronic obstructive pulmonary disease, unspecified: Secondary | ICD-10-CM | POA: Diagnosis not present

## 2019-04-13 LAB — CBC WITH DIFFERENTIAL/PLATELET
Abs Immature Granulocytes: 0.05 10*3/uL (ref 0.00–0.07)
Basophils Absolute: 0.1 10*3/uL (ref 0.0–0.1)
Basophils Relative: 1 %
Eosinophils Absolute: 0.4 10*3/uL (ref 0.0–0.5)
Eosinophils Relative: 4 %
HCT: 40.9 % (ref 36.0–46.0)
Hemoglobin: 12.9 g/dL (ref 12.0–15.0)
Immature Granulocytes: 1 %
Lymphocytes Relative: 34 %
Lymphs Abs: 3.2 10*3/uL (ref 0.7–4.0)
MCH: 29.5 pg (ref 26.0–34.0)
MCHC: 31.5 g/dL (ref 30.0–36.0)
MCV: 93.6 fL (ref 80.0–100.0)
Monocytes Absolute: 0.7 10*3/uL (ref 0.1–1.0)
Monocytes Relative: 7 %
Neutro Abs: 5.2 10*3/uL (ref 1.7–7.7)
Neutrophils Relative %: 53 %
Platelets: 277 10*3/uL (ref 150–400)
RBC: 4.37 MIL/uL (ref 3.87–5.11)
RDW: 12.7 % (ref 11.5–15.5)
WBC: 9.5 10*3/uL (ref 4.0–10.5)
nRBC: 0 % (ref 0.0–0.2)

## 2019-04-13 LAB — FERRITIN: Ferritin: 44 ng/mL (ref 11–307)

## 2019-04-13 LAB — IRON AND TIBC
Iron: 68 ug/dL (ref 28–170)
Saturation Ratios: 17 % (ref 10.4–31.8)
TIBC: 405 ug/dL (ref 250–450)
UIBC: 337 ug/dL

## 2019-04-13 NOTE — Progress Notes (Signed)
Hematology/Oncology follow up note [facility] [phone] [phone]   Patient Care Team: Casilda Carls, MD as PCP - General  REFERRING PROVIDER: Casilda Carls, MD CHIEF COMPLAINTS/REASON FOR VISIT:  Follow up  iron deficiency anemia  HISTORY OF PRESENTING ILLNESS:  Lorraine Boone is a  69 y.o.  female with PMH listed below was seen in consultation at the request of Casilda Carls, MD   for evaluation of iron deficiency anemia.   Reviewed patient's recent labs  07/14/2018 labs revealed anemia with hemoglobin of 8.3.  MCV 77.7, white count 6.9, platelet count 337,000 LFT shows slightly elevated AST at 44, ALT 18, bilirubin 0.3, alkaline phosphatase 85. Iron panel showed TIBC 445, iron saturation 6, ferritin 11.  Folate 9.5, vitamin B12 125.  Patient has not had any CBC results available in current EMR.  Associated signs and symptoms: Patient reports fatigue.  Admits to SOB with exertion.  Denies weight loss, easy bruising, hematochezia, hemoptysis, hematuria. Context:  History of iron deficiency: Denies Rectal bleeding: Denies Menstrual bleeding/ Vaginal bleeding : Denies Hematemesis or hemoptysis : denies Blood in urine : denies  Last endoscopy: 08/11/2018  Colonoscopy the examined portion of the ileum was normal. - One 7 mm polyp in the ascending colon, removed with a hot snare. Resected and retrieved.- One 5 mm polyp in the transverse colon, removed with a cold snare. Resected and retrieved.- Severe diverticulosis in the entire examined colon. There was no evidence of diverticular bleeding.- The distal rectum and anal verge are normal on retroflexion view. Upper endoscopy Normal duodenal bulb and second portion of the duodenum. Biopsied. - Gastric mucosal atrophy. Biopsied. - Normal antrum. Biopsied. - Esophagogastric landmarks identified. - Normal gastroesophageal junction and esophagus  Duodenal biopsy negative for villous  atrophy, increased intraepithelial lymphocytes and organisms Stomach random biopsy shows benign gastric mucosa with foveolar hyperplastic, mild chronic gastritis and focal intestinal metaplasia.  Negative for dysplasia or malignancy. Colon polyps showed tubular villous adenoma, negative for high-grade dysplasia and malignancy.  # Patient was last seen in 08/14/2018.  At that time IV iron infusion was recommended and patient prefers to defer and to try oral iron supplementation first.  Then she changed her mind and got IV Venofer weekly x4.  INTERVAL HISTORY Lorraine Boone is a 69 y.o. female who has above history reviewed by me today presents for follow up visit for management of iron deficiency anemia. Problems and complaints are listed below: She is on ferrous sulfate 325mg  once daily.  Patient was on self vitamin B12 injections which were started by Dr. 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  Review of Systems  Constitutional: Positive for fatigue. Negative for appetite change, chills and fever.  HENT:   Negative for hearing loss and voice change.   Eyes: Negative for eye problems.  Respiratory: Negative for chest tightness and cough.   Cardiovascular: Negative for chest pain.  Gastrointestinal: Negative for abdominal distention, abdominal pain and blood in stool.  Endocrine: Negative for hot flashes.  Genitourinary: Negative for difficulty urinating and frequency.   Musculoskeletal: Negative for arthralgias.  Skin: Negative for itching and rash.  Neurological: Negative for extremity weakness.  Hematological: Negative for adenopathy.  Psychiatric/Behavioral: Negative for confusion.    MEDICAL HISTORY:  Past Medical History:  Diagnosis Date  . Arthritis   . Bradycardia   . COPD (chronic obstructive pulmonary disease) ([place])   . Diabetes mellitus type 2, uncomplicated ([place])   . Dyspnea   .  Hyperlipidemia   . Hypertension   . Intermittent  complete heart block ([place])   . Iron deficiency anemia due to chronic blood loss 08/18/2018    SURGICAL HISTORY: Past Surgical History:  Procedure Laterality Date  . CHOLECYSTECTOMY    . COLONOSCOPY WITH PROPOFOL N/A 08/11/2018   Procedure: COLONOSCOPY WITH PROPOFOL;  Surgeon: Lin Landsman, MD;  Location: [facility] ENDOSCOPY;  Service: Gastroenterology;  Laterality: N/A;  . ESOPHAGOGASTRODUODENOSCOPY (EGD) WITH PROPOFOL N/A 08/11/2018   Procedure: ESOPHAGOGASTRODUODENOSCOPY (EGD) WITH PROPOFOL;  Surgeon: Lin Landsman, MD;  Location: [facility] ENDOSCOPY;  Service: Gastroenterology;  Laterality: N/A;  . ESSURE TUBAL LIGATION    . INSERT / REPLACE / REMOVE PACEMAKER    . PACEMAKER INSERTION N/A 12/22/2015   Procedure: INSERTION PACEMAKER;  Surgeon: Isaias Cowman, MD;  Location: [facility] ORS;  Service: Cardiovascular;  Laterality: N/A;    SOCIAL HISTORY: Social History   Socioeconomic History  . Marital status: Unknown    Spouse name: Not on file  . Number of children: Not on file  . Years of education: Not on file  . Highest education level: Not on file  Occupational History  . Not on file  Tobacco Use  . Smoking status: Former Smoker    Packs/day: 0.25    Years: 35.00    Pack years: 8.75    Types: Cigarettes    Quit date: 12/21/2015    Years since quitting: 3.3  . Smokeless tobacco: Never Used  Substance and Sexual Activity  . Alcohol use: No  . Drug use: No  . Sexual activity: Not on file  Other Topics Concern  . Not on file  Social History Narrative  . Not on file   Social Determinants of Health   Financial Resource Strain:   . Difficulty of Paying Living Expenses:   Food Insecurity:   . Worried About Charity fundraiser in the Last Year:   . Arboriculturist in the Last Year:   Transportation Needs:   . Film/video editor (Medical):   Marland Kitchen Lack of Transportation (Non-Medical):   Physical Activity:   . Days of Exercise per Week:   . Minutes of Exercise per  Session:   Stress:   . Feeling of Stress :   Social Connections:   . Frequency of Communication with Friends and Family:   . Frequency of Social Gatherings with Friends and Family:   . Attends Religious Services:   . Active Member of Clubs or Organizations:   . Attends Archivist Meetings:   Marland Kitchen Marital Status:   Intimate Partner Violence:   . Fear of Current or Ex-Partner:   . Emotionally Abused:   Marland Kitchen Physically Abused:   . Sexually Abused:     FAMILY HISTORY: Family History  Problem Relation Age of Onset  . Diabetes Mother   . Heart disease Mother     ALLERGIES:  has No Known Allergies.  MEDICATIONS:  Current Outpatient Medications  Medication Sig Dispense Refill  . acetaminophen (TYLENOL) 325 MG tablet Take 650 mg by mouth every 6 (six) hours as needed.    . cyanocobalamin (,VITAMIN B-12,) 1000 MCG/ML injection INJECT 1ML EVERY MONTH 6 mL 0  . ergocalciferol (VITAMIN D2) 50000 units capsule Take 50,000 Units by mouth once a week.    . ferrous sulfate 325 (65 FE) MG EC tablet Take 1 tablet (325 mg total) by mouth 3 (three) times daily with meals. 90 tablet 1  . Fluticasone-Salmeterol (WIXELA INHUB) 250-50 MCG/DOSE  AEPB Inhale 1 puff into the lungs 2 (two) times daily.    . furosemide (LASIX) 40 MG tablet Take 1 tablet (40 mg total) by mouth daily. 30 tablet 5  . insulin glargine (LANTUS) 100 UNIT/ML injection Inject 50 Units into the skin daily.    . Ipratropium-Albuterol (COMBIVENT RESPIMAT) 20-100 MCG/ACT AERS respimat Inhale 1 puff into the lungs every 6 (six) hours.    Marland Kitchen ipratropium-albuterol (DUONEB) 0.5-2.5 (3) MG/3ML SOLN Take 3 mLs by nebulization every 6 (six) hours as needed. 360 mL 1  . JANUMET XR 50-1000 MG TB24 TK 1 T PO BID    . losartan (COZAAR) 100 MG tablet Take 100 mg by mouth daily.    . metoprolol (LOPRESSOR) 50 MG tablet Take 50 mg by mouth 2 (two) times daily.    . rosuvastatin (CRESTOR) 10 MG tablet Take 10 mg by mouth daily.    .  Saxagliptin-Metformin (KOMBIGLYZE XR) 2.05-998 MG TB24 Take 1 tablet by mouth 2 (two) times daily.     No current facility-administered medications for this visit.     PHYSICAL EXAMINATION: ECOG PERFORMANCE STATUS: 1 - Symptomatic but completely ambulatory Vitals:   04/13/19 1008  BP: (!) 172/80  Pulse: (!) 59  Resp: 18  Temp: (!) 94.7 F (34.8 C)   Filed Weights   04/13/19 1008  Weight: 249 lb 12.8 oz (113.3 kg)    Physical Exam Constitutional:      General: She is not in acute distress.    Appearance: She is obese.  HENT:     Head: Normocephalic and atraumatic.  Eyes:     General: No scleral icterus.    Pupils: Pupils are equal, round, and reactive to light.  Cardiovascular:     Rate and Rhythm: Normal rate and regular rhythm.     Heart sounds: Normal heart sounds.     Comments: Patient has a pacemaker Pulmonary:     Effort: Pulmonary effort is normal. No respiratory distress.     Breath sounds: No wheezing.  Abdominal:     General: Bowel sounds are normal. There is no distension.     Palpations: Abdomen is soft. There is no mass.     Tenderness: There is no abdominal tenderness.  Musculoskeletal:        General: No deformity. Normal range of motion.     Cervical back: Normal range of motion and neck supple.  Skin:    General: Skin is warm and dry.     Coloration: Skin is not pale.     Findings: No erythema or rash.  Neurological:     Mental Status: She is alert and oriented to person, place, and time. Mental status is at baseline.     Cranial Nerves: No cranial nerve deficit.     Coordination: Coordination normal.  Psychiatric:        Mood and Affect: Mood normal.        Behavior: Behavior normal.        Thought Content: Thought content normal.       CMP Latest Ref Rng & Units 10/13/2018  Glucose 65 - 99 mg/dL -  BUN 6 - 20 mg/dL -  Creatinine 0.44 - 1.00 mg/dL -  Sodium 135 - 145 mmol/L -  Potassium 3.5 - 5.1 mmol/L -  Chloride 101 - 111 mmol/L -    CO2 22 - 32 mmol/L -  Calcium 8.9 - 10.3 mg/dL -  Total Protein 6.0 - 8.5 g/dL 6.7  Total Bilirubin  0.0 - 1.2 mg/dL 0.3  Alkaline Phos 39 - 117 IU/L 82  AST 0 - 40 IU/L 41(H)  ALT 0 - 32 IU/L 25   CBC Latest Ref Rng & Units 04/13/2019  WBC 4.0 - 10.5 K/uL 9.5  Hemoglobin 12.0 - 15.0 g/dL 12.9  Hematocrit 36.0 - 46.0 % 40.9  Platelets 150 - 400 K/uL 277     LABORATORY DATA:  I have reviewed the data as listed Lab Results  Component Value Date   WBC 9.5 04/13/2019   HGB 12.9 04/13/2019   HCT 40.9 04/13/2019   MCV 93.6 04/13/2019   PLT 277 04/13/2019   Recent Labs    07/14/18 1447 10/13/18 1444  PROT 6.4 6.7  ALBUMIN 4.0 4.1  AST 44* 41*  ALT 18 25  ALKPHOS 85 82  BILITOT 0.3 0.3  BILIDIR 0.13 0.14   Iron/TIBC/Ferritin/ %Sat    Component Value Date/Time   IRON 68 04/13/2019 0944   IRON 71 10/13/2018 1444   TIBC 405 04/13/2019 0944   TIBC 327 10/13/2018 1444   FERRITIN 44 04/13/2019 0944   FERRITIN 133 10/13/2018 1444   IRONPCTSAT 17 04/13/2019 0944   IRONPCTSAT 22 10/13/2018 1444     No results found.    ASSESSMENT & PLAN:  1. Iron deficiency anemia due to chronic blood loss   2. Vitamin B12 deficiency   Iron deficiency Labs are reviewed and discussed with patient. Anemia has resolved.  Iron panel results were available after her visit.  Continue oral ferrous sulfate 325mg  daily.   # B12 deficiency,has been on self B12 injections, managed by Dr.Vanga.  Recommend patient to continue B12 injections.   # Autoimmune atrophic gastritis, follow up with GI.  Continue to monitor. Follow-up in 1 year.   Orders Placed This Encounter  Procedures  . CBC with Differential/Platelet    Standing Status:   Future    Standing Expiration Date:   04/12/2020  . Comprehensive metabolic panel    Standing Status:   Future    Standing Expiration Date:   04/12/2020  . Iron and TIBC    Standing Status:   Future    Standing Expiration Date:   04/12/2020  . Ferritin     Standing Status:   Future    Standing Expiration Date:   04/12/2020  . Vitamin B12    Standing Status:   Future    Standing Expiration Date:   04/12/2020    All questions were answered. The patient knows to call the clinic with any problems questions or concerns.  Cc Casilda Carls, MD  Return of visit: 6 months.     Earlie Server, MD, PhD Hematology Oncology [place] at [facility] 04/13/2019

## 2019-04-13 NOTE — Progress Notes (Signed)
Patient does not offer any problems today.  

## 2019-08-06 ENCOUNTER — Other Ambulatory Visit: Payer: Self-pay | Admitting: Internal Medicine

## 2019-08-26 ENCOUNTER — Other Ambulatory Visit
Admission: RE | Admit: 2019-08-26 | Discharge: 2019-08-26 | Disposition: A | Discharge status: Home / Self Care | Payer: Medicare Other | Source: Ambulatory Visit | Attending: Cardiology | Admitting: Cardiology

## 2019-08-26 ENCOUNTER — Other Ambulatory Visit: Payer: Self-pay

## 2019-08-26 DIAGNOSIS — Z01812 Encounter for preprocedural laboratory examination: Secondary | ICD-10-CM | POA: Diagnosis present

## 2019-08-26 DIAGNOSIS — Z20822 Contact with and (suspected) exposure to covid-19: Secondary | ICD-10-CM | POA: Diagnosis not present

## 2019-08-26 LAB — SARS CORONAVIRUS 2 BY RT PCR (HOSPITAL ORDER, PERFORMED IN ~~LOC~~ HOSPITAL LAB): SARS Coronavirus 2: NEGATIVE

## 2019-08-27 ENCOUNTER — Encounter: Payer: Self-pay | Admitting: Cardiology

## 2019-08-27 ENCOUNTER — Other Ambulatory Visit: Payer: Self-pay

## 2019-08-27 ENCOUNTER — Observation Stay
Admission: RE | Admit: 2019-08-27 | Discharge: 2019-08-28 | Disposition: A | Discharge status: Home / Self Care | Payer: Medicare Other | Attending: Cardiology | Admitting: Cardiology

## 2019-08-27 ENCOUNTER — Encounter
Admission: RE | Disposition: A | Discharge status: Home / Self Care | Payer: Self-pay | Source: Home / Self Care | Attending: Cardiology

## 2019-08-27 DIAGNOSIS — I5031 Acute diastolic (congestive) heart failure: Secondary | ICD-10-CM | POA: Diagnosis not present

## 2019-08-27 DIAGNOSIS — Z79899 Other long term (current) drug therapy: Secondary | ICD-10-CM | POA: Diagnosis not present

## 2019-08-27 DIAGNOSIS — T82110A Breakdown (mechanical) of cardiac electrode, initial encounter: Secondary | ICD-10-CM

## 2019-08-27 DIAGNOSIS — Z794 Long term (current) use of insulin: Secondary | ICD-10-CM | POA: Diagnosis not present

## 2019-08-27 DIAGNOSIS — J449 Chronic obstructive pulmonary disease, unspecified: Secondary | ICD-10-CM | POA: Diagnosis not present

## 2019-08-27 DIAGNOSIS — I110 Hypertensive heart disease with heart failure: Secondary | ICD-10-CM | POA: Insufficient documentation

## 2019-08-27 DIAGNOSIS — E119 Type 2 diabetes mellitus without complications: Secondary | ICD-10-CM | POA: Diagnosis not present

## 2019-08-27 DIAGNOSIS — Z006 Encounter for examination for normal comparison and control in clinical research program: Secondary | ICD-10-CM | POA: Diagnosis not present

## 2019-08-27 DIAGNOSIS — R001 Bradycardia, unspecified: Secondary | ICD-10-CM | POA: Diagnosis present

## 2019-08-27 DIAGNOSIS — I442 Atrioventricular block, complete: Secondary | ICD-10-CM | POA: Diagnosis not present

## 2019-08-27 HISTORY — PX: PACEMAKER LEADLESS INSERTION: EP1219

## 2019-08-27 HISTORY — PX: TEMPORARY PACEMAKER: CATH118268

## 2019-08-27 LAB — CBC
HCT: 38.8 % (ref 36.0–46.0)
Hemoglobin: 12.6 g/dL (ref 12.0–15.0)
MCH: 29.4 pg (ref 26.0–34.0)
MCHC: 32.5 g/dL (ref 30.0–36.0)
MCV: 90.7 fL (ref 80.0–100.0)
Platelets: 238 10*3/uL (ref 150–400)
RBC: 4.28 MIL/uL (ref 3.87–5.11)
RDW: 13.1 % (ref 11.5–15.5)
WBC: 10.9 10*3/uL — ABNORMAL HIGH (ref 4.0–10.5)
nRBC: 0 % (ref 0.0–0.2)

## 2019-08-27 LAB — BASIC METABOLIC PANEL
Anion gap: 15 (ref 5–15)
BUN: 29 mg/dL — ABNORMAL HIGH (ref 8–23)
CO2: 23 mmol/L (ref 22–32)
Calcium: 9.4 mg/dL (ref 8.9–10.3)
Chloride: 104 mmol/L (ref 98–111)
Creatinine, Ser: 1.72 mg/dL — ABNORMAL HIGH (ref 0.44–1.00)
GFR calc Af Amer: 35 mL/min — ABNORMAL LOW (ref >60)
GFR calc non Af Amer: 30 mL/min — ABNORMAL LOW (ref >60)
Glucose, Bld: 178 mg/dL — ABNORMAL HIGH (ref 70–99)
Potassium: 4.5 mmol/L (ref 3.5–5.1)
Sodium: 142 mmol/L (ref 135–145)

## 2019-08-27 LAB — GLUCOSE, CAPILLARY
Glucose-Capillary: 175 mg/dL — ABNORMAL HIGH (ref 70–99)
Glucose-Capillary: 151 mg/dL — ABNORMAL HIGH (ref 70–99)
Glucose-Capillary: 151 mg/dL — ABNORMAL HIGH (ref 70–99)
Glucose-Capillary: 233 mg/dL — ABNORMAL HIGH (ref 70–99)
Glucose-Capillary: 144 mg/dL — ABNORMAL HIGH (ref 70–99)

## 2019-08-27 SURGERY — PACEMAKER LEADLESS INSERTION
Anesthesia: Moderate Sedation

## 2019-08-27 SURGERY — REMOVAL, ELECTRODE LEAD, CARDIAC PACEMAKER, WITHOUT REPLACEMENT
Anesthesia: Choice | Laterality: Left

## 2019-08-27 MED ORDER — HEPARIN SODIUM (PORCINE) 1000 UNIT/ML IJ SOLN
INTRAMUSCULAR | Status: DC | PRN
  Administered 2019-08-27: 5000 [IU] via INTRAVENOUS

## 2019-08-27 MED ORDER — LOSARTAN POTASSIUM 50 MG PO TABS
100.0000 mg | ORAL_TABLET | Freq: Every day | ORAL | Status: DC
  Administered 2019-08-27 – 2019-08-28 (×2): 100 mg via ORAL
  Filled 2019-08-27 (×2): qty 2

## 2019-08-27 MED ORDER — FENTANYL CITRATE (PF) 100 MCG/2ML IJ SOLN
INTRAMUSCULAR | Status: DC | PRN
  Administered 2019-08-27 (×4): 25 ug via INTRAVENOUS

## 2019-08-27 MED ORDER — ONDANSETRON HCL 4 MG/2ML IJ SOLN: 4.0000 mg | Freq: Four times a day (QID) | INTRAMUSCULAR | Status: DC | PRN

## 2019-08-27 MED ORDER — ACETAMINOPHEN 325 MG PO TABS
650.0000 mg | ORAL_TABLET | ORAL | Status: DC | PRN
  Administered 2019-08-27: 650 mg via ORAL
  Filled 2019-08-27: qty 2

## 2019-08-27 MED ORDER — LIDOCAINE HCL (PF) 1 % IJ SOLN
INTRAMUSCULAR | Status: DC | PRN
  Administered 2019-08-27: 30 mL
  Administered 2019-08-27: 10 mL

## 2019-08-27 MED ORDER — SODIUM CHLORIDE 0.9 % IV SOLN: INTRAVENOUS | Status: DC

## 2019-08-27 MED ORDER — PNEUMOCOCCAL VAC POLYVALENT 25 MCG/0.5ML IJ INJ
0.5000 mL | INJECTION | INTRAMUSCULAR | Status: DC
  Filled 2019-08-27: qty 0.5

## 2019-08-27 MED ORDER — SODIUM CHLORIDE 0.45 % IV SOLN: INTRAVENOUS | Status: DC

## 2019-08-27 MED ORDER — TIOTROPIUM BROMIDE MONOHYDRATE 18 MCG IN CAPS
18.0000 ug | ORAL_CAPSULE | Freq: Every day | RESPIRATORY_TRACT | Status: DC
  Administered 2019-08-27 – 2019-08-28 (×2): 18 ug via RESPIRATORY_TRACT
  Filled 2019-08-27: qty 5

## 2019-08-27 MED ORDER — SODIUM CHLORIDE 0.9% FLUSH: 3.0000 mL | INTRAVENOUS | Status: DC | PRN

## 2019-08-27 MED ORDER — HEPARIN SODIUM (PORCINE) 1000 UNIT/ML IJ SOLN
INTRAMUSCULAR | Status: AC
  Filled 2019-08-27: qty 1

## 2019-08-27 MED ORDER — MIDAZOLAM HCL 2 MG/2ML IJ SOLN
INTRAMUSCULAR | Status: AC
  Filled 2019-08-27: qty 2

## 2019-08-27 MED ORDER — FENTANYL CITRATE (PF) 100 MCG/2ML IJ SOLN
INTRAMUSCULAR | Status: AC
  Filled 2019-08-27: qty 2

## 2019-08-27 MED ORDER — ROSUVASTATIN CALCIUM 10 MG PO TABS
10.0000 mg | ORAL_TABLET | Freq: Every day | ORAL | Status: DC
  Administered 2019-08-27: 10 mg via ORAL
  Filled 2019-08-27 (×2): qty 1

## 2019-08-27 MED ORDER — IOHEXOL 300 MG/ML  SOLN
INTRAMUSCULAR | Status: DC | PRN
  Administered 2019-08-27: 25 mL

## 2019-08-27 MED ORDER — LIDOCAINE HCL (PF) 1 % IJ SOLN
INTRAMUSCULAR | Status: AC
  Filled 2019-08-27: qty 30

## 2019-08-27 MED ORDER — IPRATROPIUM-ALBUTEROL 0.5-2.5 (3) MG/3ML IN SOLN
3.0000 mL | Freq: Two times a day (BID) | RESPIRATORY_TRACT | Status: DC
  Administered 2019-08-27 – 2019-08-28 (×2): 3 mL via RESPIRATORY_TRACT
  Filled 2019-08-27 (×2): qty 3

## 2019-08-27 MED ORDER — IPRATROPIUM-ALBUTEROL 0.5-2.5 (3) MG/3ML IN SOLN
3.0000 mL | Freq: Four times a day (QID) | RESPIRATORY_TRACT | Status: DC | PRN
  Administered 2019-08-27: 3 mL via RESPIRATORY_TRACT

## 2019-08-27 MED ORDER — METOPROLOL TARTRATE 50 MG PO TABS
50.0000 mg | ORAL_TABLET | Freq: Two times a day (BID) | ORAL | Status: DC
  Administered 2019-08-27 – 2019-08-28 (×3): 50 mg via ORAL
  Filled 2019-08-27 (×3): qty 1

## 2019-08-27 MED ORDER — SODIUM CHLORIDE 0.9 % IV SOLN: 250.0000 mL | INTRAVENOUS | Status: DC | PRN

## 2019-08-27 MED ORDER — INSULIN GLARGINE 100 UNIT/ML ~~LOC~~ SOLN
60.0000 [IU] | Freq: Every day | SUBCUTANEOUS | Status: DC
  Administered 2019-08-27 – 2019-08-28 (×2): 60 [IU] via SUBCUTANEOUS
  Filled 2019-08-27 (×2): qty 0.6

## 2019-08-27 MED ORDER — HEPARIN (PORCINE) IN NACL 1000-0.9 UT/500ML-% IV SOLN
INTRAVENOUS | Status: AC
  Filled 2019-08-27: qty 1000

## 2019-08-27 MED ORDER — HEPARIN (PORCINE) IN NACL 1000-0.9 UT/500ML-% IV SOLN
INTRAVENOUS | Status: DC | PRN
  Administered 2019-08-27 (×2): 500 mL

## 2019-08-27 MED ORDER — IPRATROPIUM-ALBUTEROL 0.5-2.5 (3) MG/3ML IN SOLN: 3.0000 mL | Freq: Four times a day (QID) | RESPIRATORY_TRACT | Status: DC

## 2019-08-27 MED ORDER — MIDAZOLAM HCL 2 MG/2ML IJ SOLN
INTRAMUSCULAR | Status: DC | PRN
  Administered 2019-08-27 (×2): 1 mg via INTRAVENOUS

## 2019-08-27 MED ORDER — FUROSEMIDE 40 MG PO TABS
40.0000 mg | ORAL_TABLET | Freq: Every day | ORAL | Status: DC
  Administered 2019-08-27 – 2019-08-28 (×2): 40 mg via ORAL
  Filled 2019-08-27 (×2): qty 1

## 2019-08-27 MED ORDER — SODIUM CHLORIDE 0.9% FLUSH
3.0000 mL | Freq: Two times a day (BID) | INTRAVENOUS | Status: DC
  Administered 2019-08-27 – 2019-08-28 (×3): 3 mL via INTRAVENOUS

## 2019-08-27 SURGICAL SUPPLY — 18 items
CANNULA 5F STIFF (CANNULA) ×3 IMPLANT
DILATOR VESSEL 38 20CM 12FR (INTRODUCER) ×3 IMPLANT
DILATOR VESSEL 38 20CM 14FR (INTRODUCER) ×3 IMPLANT
DILATOR VESSEL 38 20CM 18FR (INTRODUCER) ×3 IMPLANT
DILATOR VESSEL 38 20CM 8FR (INTRODUCER) ×3 IMPLANT
GUIDEWIRE SUPER STIFF .035X180 (WIRE) ×3 IMPLANT
MICRA AV TRANSCATH PACING SYS (Pacemaker) ×3 IMPLANT
MICRA INTRODUCER SHEATH (SHEATH) ×3
NEEDLE PERC 18GX7CM (NEEDLE) ×3 IMPLANT
PACK CARDIAC CATH (CUSTOM PROCEDURE TRAY) ×3 IMPLANT
PANNUS RETENTION SYSTEM 2 PAD (MISCELLANEOUS) ×3 IMPLANT
SHEATH AVANTI 6FR X 11CM (SHEATH) ×6 IMPLANT
SHEATH AVANTI 7FRX11 (SHEATH) ×3 IMPLANT
SHEATH INTRODUCER MICRA (SHEATH) ×1 IMPLANT
SLEEVE REPOSITIONING LENGTH 30 (MISCELLANEOUS) ×3 IMPLANT
SYSTEM PACING TRNSCTH AV MICRA (Pacemaker) ×1 IMPLANT
WIRE GUIDERIGHT .035X150 (WIRE) ×3 IMPLANT
WIRE PACING TEMP ST TIP 5 (CATHETERS) ×3 IMPLANT

## 2019-08-27 NOTE — Plan of Care (Signed)
  Problem: Education: Goal: Knowledge of cardiac device and self-care will improve Outcome: Progressing Goal: Ability to safely manage health related needs after discharge will improve Outcome: Progressing   Problem: Cardiac: Goal: Ability to achieve and maintain adequate cardiopulmonary perfusion will improve Outcome: Progressing

## 2019-08-28 DIAGNOSIS — Z950 Presence of cardiac pacemaker: Secondary | ICD-10-CM

## 2019-08-28 DIAGNOSIS — I442 Atrioventricular block, complete: Secondary | ICD-10-CM | POA: Diagnosis not present

## 2019-08-28 LAB — GLUCOSE, CAPILLARY
Glucose-Capillary: 139 mg/dL — ABNORMAL HIGH (ref 70–99)
Glucose-Capillary: 141 mg/dL — ABNORMAL HIGH (ref 70–99)
Glucose-Capillary: 150 mg/dL — ABNORMAL HIGH (ref 70–99)

## 2019-08-28 LAB — BASIC METABOLIC PANEL
Anion gap: 9 (ref 5–15)
BUN: 26 mg/dL — ABNORMAL HIGH (ref 8–23)
CO2: 30 mmol/L (ref 22–32)
Calcium: 8.8 mg/dL — ABNORMAL LOW (ref 8.9–10.3)
Chloride: 103 mmol/L (ref 98–111)
Creatinine, Ser: 1.53 mg/dL — ABNORMAL HIGH (ref 0.44–1.00)
GFR calc Af Amer: 40 mL/min — ABNORMAL LOW (ref >60)
GFR calc non Af Amer: 34 mL/min — ABNORMAL LOW (ref >60)
Glucose, Bld: 141 mg/dL — ABNORMAL HIGH (ref 70–99)
Potassium: 3.6 mmol/L (ref 3.5–5.1)
Sodium: 142 mmol/L (ref 135–145)

## 2019-08-28 MED ORDER — METOPROLOL TARTRATE 50 MG PO TABS: 50.0000 mg | ORAL_TABLET | Freq: Two times a day (BID) | ORAL | 5 refills | Status: DC

## 2019-08-28 NOTE — Discharge Summary (Signed)
Physician Discharge Summary  Patient ID: Lorraine Boone MRN: [id number] DOB/AGE: 1950/06/28 69 y.o.  Admit date: 08/27/2019 Discharge date: 08/28/2019  Primary Discharge Diagnosis complete heart block Secondary Discharge Diagnosis bradycardia  Significant Diagnostic Studies: yes  Consults: None  Hospital Course: Patient underwent elective Micra AV leadless pacemaker insertion on 08/29/4164 without complication.  Patient had uneventful hospitalization.  Telemetry revealed atrial sensing with ventricular pacing.  On the morning of 08/28/2019, the patient was ambulating without difficulty and was discharged home in stable condition.   Discharge Exam: Blood pressure (!) 186/63, pulse 69, temperature 98.4 F (36.9 C), temperature source Oral, resp. rate 17, height 5\' 3"  (1.6 m), weight 110.5 kg, SpO2 98 %.   General appearance: alert Head: Normocephalic, without obvious abnormality, atraumatic Eyes: conjunctivae/corneas clear. PERRL, EOM's intact. Fundi benign. Ears: normal TM's and external ear canals both ears Nose: Nares normal. Septum midline. Mucosa normal. No drainage or sinus tenderness. Throat: lips, mucosa, and tongue normal; teeth and gums normal Neck: no adenopathy, no carotid bruit, no JVD, supple, symmetrical, trachea midline and thyroid not enlarged, symmetric, no tenderness/mass/nodules Back: symmetric, no curvature. ROM normal. No CVA tenderness. Resp: clear to auscultation bilaterally Chest wall: no tenderness Cardio: regular rate and rhythm, S1, S2 normal, no murmur, click, rub or gallop GI: soft, non-tender; bowel sounds normal; no masses,  no organomegaly Extremities: extremities normal, atraumatic, no cyanosis or edema Pulses: 2+ and symmetric Skin: Skin color, texture, turgor normal. No rashes or lesions Lymph nodes: Cervical, supraclavicular, and axillary nodes normal. Neurologic: Grossly normal Incision/Wound: Well-healing without evidence of hematoma Labs:   Lab  Results  Component Value Date   WBC 10.9 (H) 08/27/2019   HGB 12.6 08/27/2019   HCT 38.8 08/27/2019   MCV 90.7 08/27/2019   PLT 238 08/27/2019    Recent Labs  Lab 08/28/19 0419  NA 142  K 3.6  CL 103  CO2 30  BUN 26*  CREATININE 1.53*  CALCIUM 8.8*  GLUCOSE 141*      Radiology:  EKG: Atrial sensing with ventricular pacing  FOLLOW UP PLANS AND APPOINTMENTS  Allergies as of 08/28/2019   No Known Allergies     Medication List    STOP taking these medications   cyanocobalamin 1000 MCG/ML injection Commonly known as: (VITAMIN B-12)   ergocalciferol 1.25 MG (50000 UT) capsule Commonly known as: VITAMIN D2   Kombiglyze XR 2.05-998 MG Tb24 Generic drug: Saxagliptin-Metformin     TAKE these medications   acetaminophen 325 MG tablet Commonly known as: TYLENOL Take 650 mg by mouth every 6 (six) hours as needed for moderate pain.   cholecalciferol 25 MCG (1000 UNIT) tablet Commonly known as: VITAMIN D3 Take 1,000 Units by mouth daily.   ferrous sulfate 325 (65 FE) MG EC tablet Take 1 tablet (325 mg total) by mouth 3 (three) times daily with meals. What changed: when to take this   furosemide 20 MG tablet Commonly known as: LASIX Take 20 mg by mouth daily. What changed: Another medication with the same name was removed. Continue taking this medication, and follow the directions you see here.   insulin glargine 100 UNIT/ML injection Commonly known as: LANTUS Inject 60 Units into the skin daily.   ipratropium-albuterol 0.5-2.5 (3) MG/3ML Soln Commonly known as: DUONEB Take 3 mLs by nebulization every 6 (six) hours as needed. What changed: Another medication with the same name was removed. Continue taking this medication, and follow the directions you see here.   Janumet XR 50-1000 MG  Tb24 Generic drug: SitaGLIPtin-MetFORMIN HCl Take 1 tablet by mouth in the morning and at bedtime.   losartan 100 MG tablet Commonly known as: COZAAR Take 100 mg by mouth  daily.   metoprolol tartrate 50 MG tablet Commonly known as: LOPRESSOR Take 1 tablet (50 mg total) by mouth 2 (two) times daily.   rosuvastatin 10 MG tablet Commonly known as: CRESTOR Take 10 mg by mouth daily.   Wixela Inhub 250-50 MCG/DOSE Aepb Generic drug: Fluticasone-Salmeterol INHALE 1 PUFF INTO THE LUNGS TWICE DAILY What changed: See the new instructions.       Follow-up Information    Loriana Samad, MD Follow up in 1 week(s).   Specialty: Cardiology Contact information: [facility] [place] [address] [phone]               BRING ALL MEDICATIONS WITH YOU TO FOLLOW UP APPOINTMENTS  Time spent with patient to include physician time: 25 minutes Signed:  Isaias Cowman MD, PhD, [facility] 08/28/2019, 8:10 AM

## 2019-08-28 NOTE — Discharge Instructions (Signed)
May remove bandage and shower on 08/30/2019.

## 2019-08-28 NOTE — Progress Notes (Signed)
Stormy Fabian to be D/C'd Home per MD order.  Discussed prescriptions and follow up appointments with the patient. Prescriptions given to patient, medication list explained in detail. Pt verbalized understanding.  Allergies as of 08/28/2019   No Known Allergies     Medication List    STOP taking these medications   cyanocobalamin 1000 MCG/ML injection Commonly known as: (VITAMIN B-12)   ergocalciferol 1.25 MG (50000 UT) capsule Commonly known as: VITAMIN D2   Kombiglyze XR 2.05-998 MG Tb24 Generic drug: Saxagliptin-Metformin     TAKE these medications   acetaminophen 325 MG tablet Commonly known as: TYLENOL Take 650 mg by mouth every 6 (six) hours as needed for moderate pain.   cholecalciferol 25 MCG (1000 UNIT) tablet Commonly known as: VITAMIN D3 Take 1,000 Units by mouth daily.   ferrous sulfate 325 (65 FE) MG EC tablet Take 1 tablet (325 mg total) by mouth 3 (three) times daily with meals. What changed: when to take this   furosemide 20 MG tablet Commonly known as: LASIX Take 20 mg by mouth daily. What changed: Another medication with the same name was removed. Continue taking this medication, and follow the directions you see here.   insulin glargine 100 UNIT/ML injection Commonly known as: LANTUS Inject 60 Units into the skin daily.   ipratropium-albuterol 0.5-2.5 (3) MG/3ML Soln Commonly known as: DUONEB Take 3 mLs by nebulization every 6 (six) hours as needed. What changed: Another medication with the same name was removed. Continue taking this medication, and follow the directions you see here.   Janumet XR 50-1000 MG Tb24 Generic drug: SitaGLIPtin-MetFORMIN HCl Take 1 tablet by mouth in the morning and at bedtime.   losartan 100 MG tablet Commonly known as: COZAAR Take 100 mg by mouth daily.   metoprolol tartrate 50 MG tablet Commonly known as: LOPRESSOR Take 1 tablet (50 mg total) by mouth 2 (two) times daily.   rosuvastatin 10 MG tablet Commonly  known as: CRESTOR Take 10 mg by mouth daily.   Wixela Inhub 250-50 MCG/DOSE Aepb Generic drug: Fluticasone-Salmeterol INHALE 1 PUFF INTO THE LUNGS TWICE DAILY What changed: See the new instructions.       Vitals:   08/28/19 0444 08/28/19 0747  BP: (!) 144/51 (!) 186/63  Pulse: 70 69  Resp: 17 17  Temp: 98.2 F (36.8 C) 98.4 F (36.9 C)  SpO2: 95% 98%    Tele box removed and returned. Skin clean, dry and intact without evidence of skin break down, no evidence of skin tears noted. IV catheter discontinued intact. Site without signs and symptoms of complications. Dressing and pressure applied. Pt denies pain at this time. No complaints noted.  An After Visit Summary was printed and given to the patient. Patient escorted via [place], and D/C home via private auto.  Rolley Sims

## 2019-08-31 ENCOUNTER — Encounter: Payer: Self-pay | Admitting: Cardiology

## 2020-04-07 ENCOUNTER — Telehealth: Payer: Self-pay | Admitting: Oncology

## 2020-04-07 NOTE — Telephone Encounter (Signed)
Pt called to cancel lab and md appointments scheduled on 3/18 and 3/21.  She does not want to reschedule at this time.

## 2020-04-08 ENCOUNTER — Inpatient Hospital Stay: Payer: Medicare Other

## 2020-04-11 ENCOUNTER — Inpatient Hospital Stay: Payer: Medicare Other | Admitting: Oncology

## 2022-03-22 ENCOUNTER — Other Ambulatory Visit: Payer: Self-pay | Admitting: Internal Medicine

## 2022-03-22 DIAGNOSIS — I200 Unstable angina: Secondary | ICD-10-CM

## 2023-05-09 ENCOUNTER — Other Ambulatory Visit: Payer: Self-pay | Admitting: Nephrology

## 2023-05-09 DIAGNOSIS — R809 Proteinuria, unspecified: Secondary | ICD-10-CM

## 2023-05-09 DIAGNOSIS — D631 Anemia in chronic kidney disease: Secondary | ICD-10-CM

## 2023-05-09 DIAGNOSIS — E1122 Type 2 diabetes mellitus with diabetic chronic kidney disease: Secondary | ICD-10-CM

## 2023-05-09 DIAGNOSIS — N1832 Chronic kidney disease, stage 3b: Secondary | ICD-10-CM

## 2023-05-09 DIAGNOSIS — R8290 Unspecified abnormal findings in urine: Secondary | ICD-10-CM

## 2023-06-26 LAB — LAB REPORT - SCANNED
A1c: 8.8
EGFR: 38

## 2023-08-05 ENCOUNTER — Encounter: Payer: Self-pay | Admitting: Oncology

## 2023-08-08 ENCOUNTER — Encounter: Payer: Self-pay | Admitting: Oncology

## 2023-08-12 ENCOUNTER — Ambulatory Visit
Admission: RE | Admit: 2023-08-12 | Discharge: 2023-08-12 | Disposition: A | Discharge status: Home / Self Care | Payer: Self-pay | Source: Ambulatory Visit | Attending: Nephrology | Admitting: Nephrology

## 2023-08-12 DIAGNOSIS — N1832 Chronic kidney disease, stage 3b: Secondary | ICD-10-CM | POA: Diagnosis present

## 2023-08-12 DIAGNOSIS — R809 Proteinuria, unspecified: Secondary | ICD-10-CM | POA: Diagnosis present

## 2023-08-12 DIAGNOSIS — R8290 Unspecified abnormal findings in urine: Secondary | ICD-10-CM | POA: Insufficient documentation

## 2023-08-12 DIAGNOSIS — D631 Anemia in chronic kidney disease: Secondary | ICD-10-CM | POA: Diagnosis present

## 2023-08-12 DIAGNOSIS — E1122 Type 2 diabetes mellitus with diabetic chronic kidney disease: Secondary | ICD-10-CM | POA: Diagnosis present

## 2023-08-15 ENCOUNTER — Encounter: Payer: Self-pay | Admitting: Oncology

## 2023-08-15 ENCOUNTER — Ambulatory Visit: Payer: Self-pay | Admitting: Internal Medicine

## 2023-08-15 ENCOUNTER — Encounter: Payer: Self-pay | Admitting: Internal Medicine

## 2023-08-15 ENCOUNTER — Ambulatory Visit (INDEPENDENT_AMBULATORY_CARE_PROVIDER_SITE_OTHER): Payer: Self-pay | Admitting: Internal Medicine

## 2023-08-15 VITALS — BP 122/78 | HR 64 | Ht 63.0 in | Wt 224.6 lb

## 2023-08-15 DIAGNOSIS — E119 Type 2 diabetes mellitus without complications: Secondary | ICD-10-CM

## 2023-08-15 DIAGNOSIS — I152 Hypertension secondary to endocrine disorders: Secondary | ICD-10-CM

## 2023-08-15 DIAGNOSIS — E1169 Type 2 diabetes mellitus with other specified complication: Secondary | ICD-10-CM

## 2023-08-15 DIAGNOSIS — E1159 Type 2 diabetes mellitus with other circulatory complications: Secondary | ICD-10-CM | POA: Diagnosis not present

## 2023-08-15 DIAGNOSIS — D500 Iron deficiency anemia secondary to blood loss (chronic): Secondary | ICD-10-CM

## 2023-08-15 DIAGNOSIS — E782 Mixed hyperlipidemia: Secondary | ICD-10-CM | POA: Diagnosis not present

## 2023-08-15 DIAGNOSIS — I442 Atrioventricular block, complete: Secondary | ICD-10-CM

## 2023-08-15 DIAGNOSIS — D510 Vitamin B12 deficiency anemia due to intrinsic factor deficiency: Secondary | ICD-10-CM | POA: Diagnosis not present

## 2023-08-15 DIAGNOSIS — Z794 Long term (current) use of insulin: Secondary | ICD-10-CM | POA: Diagnosis not present

## 2023-08-15 LAB — POCT CBG (FASTING - GLUCOSE)-MANUAL ENTRY: Glucose Fasting, POC: 106 mg/dL — AB (ref 70–99)

## 2023-08-15 MED ORDER — METOPROLOL TARTRATE 50 MG PO TABS: 50.0000 mg | ORAL_TABLET | Freq: Two times a day (BID) | ORAL | 5 refills | Status: AC

## 2023-08-15 MED ORDER — GLIPIZIDE ER 5 MG PO TB24
5.0000 mg | ORAL_TABLET | Freq: Every day | ORAL | 3 refills | Status: DC
Start: 2023-08-15 — End: 2023-08-16

## 2023-08-16 ENCOUNTER — Encounter: Payer: Self-pay | Admitting: Internal Medicine

## 2023-08-16 ENCOUNTER — Other Ambulatory Visit: Payer: Self-pay | Admitting: Internal Medicine

## 2023-08-16 DIAGNOSIS — Z794 Long term (current) use of insulin: Secondary | ICD-10-CM

## 2023-08-16 DIAGNOSIS — D510 Vitamin B12 deficiency anemia due to intrinsic factor deficiency: Secondary | ICD-10-CM

## 2023-08-16 LAB — CBC WITH DIFFERENTIAL/PLATELET
Basophils Absolute: 0 x10E3/uL (ref 0.0–0.2)
Basos: 0 %
EOS (ABSOLUTE): 0.2 x10E3/uL (ref 0.0–0.4)
Eos: 2 %
Hematocrit: 49.8 % — ABNORMAL HIGH (ref 34.0–46.6)
Hemoglobin: 14.9 g/dL (ref 11.1–15.9)
Immature Grans (Abs): 0 x10E3/uL (ref 0.0–0.1)
Immature Granulocytes: 0 %
Lymphocytes Absolute: 3.2 x10E3/uL — ABNORMAL HIGH (ref 0.7–3.1)
Lymphs: 32 %
MCH: 26.7 pg (ref 26.6–33.0)
MCHC: 29.9 g/dL — ABNORMAL LOW (ref 31.5–35.7)
MCV: 89 fL (ref 79–97)
Monocytes Absolute: 0.8 x10E3/uL (ref 0.1–0.9)
Monocytes: 8 %
Neutrophils Absolute: 5.8 x10E3/uL (ref 1.4–7.0)
Neutrophils: 58 %
Platelets: 279 x10E3/uL (ref 150–450)
RBC: 5.59 x10E6/uL — ABNORMAL HIGH (ref 3.77–5.28)
RDW: 14.2 % (ref 11.7–15.4)
WBC: 10 x10E3/uL (ref 3.4–10.8)

## 2023-08-16 LAB — IRON,TIBC AND FERRITIN PANEL
Ferritin: 15 ng/mL (ref 15–150)
Iron Saturation: 13 % — ABNORMAL LOW (ref 15–55)
Iron: 54 ug/dL (ref 27–139)
Total Iron Binding Capacity: 418 ug/dL (ref 250–450)
UIBC: 364 ug/dL (ref 118–369)

## 2023-08-16 LAB — VITAMIN B12: Vitamin B-12: 170 pg/mL — ABNORMAL LOW (ref 232–1245)

## 2023-08-16 MED ORDER — CYANOCOBALAMIN 1000 MCG/ML IJ SOLN: 1000.0000 ug | INTRAMUSCULAR | 3 refills | Status: AC

## 2023-08-16 MED ORDER — INSULIN SYRINGES (DISPOSABLE) U-100 1 ML MISC: 1.0000 | 0 refills | Status: DC

## 2023-08-16 NOTE — Progress Notes (Signed)
 New Patient Office Visit  Subjective   Patient ID: Lorraine Boone, female    DOB: [birth year]  Age: 73 y.o. MRN: [id number]  CC:  Chief Complaint  Patient presents with   Establish Care    NPE    HPI Lorraine Boone presents to establish care Previous Primary Care provider/office:   she does not have additional concerns to discuss today.   Patient comes in to establish PMD.  She has extensive medical history as listed.  However today she is feeling well and offers no new complaints.  Occasionally gets arthritic aches and pains.  Her most recent hemoglobin A1c was 8.8.  Patient admits to dietary noncompliance and will start improving her diet control.  She has history of pernicious anemia and needs her labs tested for that.  Also needs some refills.  She is under care of nephrologist for CKD, and cardiology for pacemaker management.  She gets regular eye exams. Does not want to get colon cancer screening although she has a history of a colon polyp. Also does not want screening mammograms.  However after discussion today she will think about it.    Outpatient Encounter Medications as of 08/15/2023  Medication Sig   acetaminophen  (TYLENOL ) 325 MG tablet Take 650 mg by mouth every 6 (six) hours as needed for moderate pain.    cholecalciferol (VITAMIN D3) 25 MCG (1000 UNIT) tablet Take 1,000 Units by mouth daily.   hydrochlorothiazide (HYDRODIURIL) 25 MG tablet Take 25 mg by mouth daily.   insulin  glargine (LANTUS ) 100 UNIT/ML injection Inject 60 Units into the skin daily.  (Patient taking differently: Inject 45 Units into the skin daily.)   ipratropium-albuterol  (DUONEB) 0.5-2.5 (3) MG/3ML SOLN Take 3 mLs by nebulization every 6 (six) hours as needed.   JARDIANCE 25 MG TABS tablet Take 25 mg by mouth daily.   losartan  (COZAAR ) 100 MG tablet Take 100 mg by mouth daily.   rosuvastatin  (CRESTOR ) 10 MG tablet Take 10 mg by mouth daily.   [DISCONTINUED] glipiZIDE  (GLUCOTROL  XL) 5 MG 24 hr tablet  Take 5 mg by mouth daily.   [DISCONTINUED] metoprolol  tartrate (LOPRESSOR ) 50 MG tablet Take 1 tablet (50 mg total) by mouth 2 (two) times daily.   ferrous sulfate  325 (65 FE) MG EC tablet Take 1 tablet (325 mg total) by mouth 3 (three) times daily with meals. (Patient not taking: Reported on 08/15/2023)   glipiZIDE  (GLUCOTROL  XL) 5 MG 24 hr tablet Take 1 tablet (5 mg total) by mouth daily.   metoprolol  tartrate (LOPRESSOR ) 50 MG tablet Take 1 tablet (50 mg total) by mouth 2 (two) times daily.   WIXELA INHUB 250-50 MCG/DOSE AEPB INHALE 1 PUFF INTO THE LUNGS TWICE DAILY (Patient not taking: Reported on 08/15/2023)   [DISCONTINUED] furosemide  (LASIX ) 20 MG tablet Take 20 mg by mouth daily. (Patient not taking: Reported on 08/15/2023)   [DISCONTINUED] JANUMET XR 50-1000 MG TB24 Take 1 tablet by mouth in the morning and at bedtime.  (Patient not taking: Reported on 08/15/2023)   No facility-administered encounter medications on file as of 08/15/2023.    Past Medical History:  Diagnosis Date   Arthritis    Bradycardia    COPD (chronic obstructive pulmonary disease) (HCC)    Diabetes mellitus type 2, uncomplicated (HCC)    Dyspnea    Hyperlipidemia    Hypertension    Intermittent complete heart block (HCC)    Iron  deficiency anemia due to chronic blood loss 08/18/2018    Past Surgical History:  Procedure Laterality Date   CHOLECYSTECTOMY     COLONOSCOPY WITH PROPOFOL  N/A 08/11/2018   Procedure: COLONOSCOPY WITH PROPOFOL ;  Surgeon: Unk Corinn Skiff, MD;  Location[facility] ENDOSCOPY;  Service: Gastroenterology;  Laterality: N/A;   ESOPHAGOGASTRODUODENOSCOPY (EGD) WITH PROPOFOL  N/A 08/11/2018   Procedure: ESOPHAGOGASTRODUODENOSCOPY (EGD) WITH PROPOFOL ;  Surgeon: Unk Corinn Skiff, MD;  Location: [facility] ENDOSCOPY;  Service: Gastroenterology;  Laterality: N/A;   ESSURE TUBAL LIGATION     INSERT / REPLACE / REMOVE PACEMAKER     PACEMAKER INSERTION N/A 12/22/2015   Procedure: INSERTION PACEMAKER;   Surgeon: Marsa Dooms, MD;  Location: [facility] ORS;  Service: Cardiovascular;  Laterality: N/A;   PACEMAKER LEADLESS INSERTION N/A 08/27/2019   Procedure: PACEMAKER LEADLESS INSERTION;  Surgeon: Dooms Marsa, MD;  Location: [facility] INVASIVE CV LAB;  Service: Cardiovascular;  Laterality: N/A;   TEMPORARY PACEMAKER N/A 08/27/2019   Procedure: TEMPORARY PACEMAKER;  Surgeon: Dooms Marsa, MD;  Location: [facility] INVASIVE CV LAB;  Service: Cardiovascular;  Laterality: N/A;    Family History  Problem Relation Age of Onset   Diabetes Mother    Heart disease Mother     Social History   Socioeconomic History   Marital status: Married    Spouse name: Not on file   Number of children: Not on file   Years of education: Not on file   Highest education level: Not on file  Occupational History   Not on file  Tobacco Use   Smoking status: Former    Current packs/day: 0.00    Average packs/day: 0.3 packs/day for 35.0 years (8.8 ttl pk-yrs)    Types: Cigarettes    Start date: 12/20/1980    Quit date: 12/21/2015    Years since quitting: 7.6   Smokeless tobacco: Never  Substance and Sexual Activity   Alcohol use: No   Drug use: No   Sexual activity: Not on file  Other Topics Concern   Not on file  Social History Narrative   Not on file   Social Drivers of Health   Financial Resource Strain: Not on file  Food Insecurity: Not on file  Transportation Needs: Not on file  Physical Activity: Not on file  Stress: Not on file  Social Connections: Not on file  Intimate Partner Violence: Not on file    Review of Systems  Constitutional: Negative.  Negative for chills, diaphoresis, malaise/fatigue and weight loss.  HENT: Negative.  Negative for sore throat.   Eyes: Negative.   Respiratory: Negative.  Negative for cough and shortness of breath.   Cardiovascular: Negative.  Negative for chest pain, palpitations and leg swelling.  Gastrointestinal: Negative.  Negative for abdominal  pain, constipation, diarrhea, heartburn, nausea and vomiting.  Genitourinary: Negative.  Negative for dysuria and flank pain.  Musculoskeletal:  Positive for joint pain. Negative for myalgias.  Skin: Negative.   Neurological: Negative.  Negative for dizziness, tingling, tremors and headaches.  Endo/Heme/Allergies: Negative.   Psychiatric/Behavioral: Negative.  Negative for depression and suicidal ideas. The patient is not nervous/anxious.         Objective   BP 122/78   Pulse 64   Ht 5' 3 (1.6 m)   Wt 224 lb 9.6 oz (101.9 kg)   SpO2 98%   BMI 39.79 kg/m   Physical Exam Vitals and nursing note reviewed.  Constitutional:      Appearance: Normal appearance.  HENT:     Head: Normocephalic and atraumatic.     Nose: Nose normal.     Mouth/Throat:  Mouth: Mucous membranes are moist.     Pharynx: Oropharynx is clear.  Eyes:     Conjunctiva/sclera: Conjunctivae normal.     Pupils: Pupils are equal, round, and reactive to light.  Cardiovascular:     Rate and Rhythm: Normal rate and regular rhythm.     Pulses: Normal pulses.     Heart sounds: Normal heart sounds. No murmur heard. Pulmonary:     Effort: Pulmonary effort is normal.     Breath sounds: Normal breath sounds. No wheezing.  Abdominal:     General: Bowel sounds are normal.     Palpations: Abdomen is soft.     Tenderness: There is no abdominal tenderness. There is no right CVA tenderness or left CVA tenderness.  Musculoskeletal:        General: Normal range of motion.     Cervical back: Normal range of motion.     Right lower leg: No edema.     Left lower leg: No edema.  Skin:    General: Skin is warm and dry.  Neurological:     General: No focal deficit present.     Mental Status: She is alert and oriented to person, place, and time.  Psychiatric:        Mood and Affect: Mood normal.        Behavior: Behavior normal.        Assessment & Plan:  Continue current medications.  Check blood work.  Will  discuss labs once available. Problem List Items Addressed This Visit     Diabetes mellitus type 2, insulin  dependent (HCC) - Primary   Relevant Medications   JARDIANCE 25 MG TABS tablet   glipiZIDE  (GLUCOTROL  XL) 5 MG 24 hr tablet   Other Relevant Orders   POCT CBG (Fasting - Glucose) (Completed)   Iron  deficiency anemia due to chronic blood loss   Relevant Orders   CBC with Diff (Completed)   Iron , TIBC and Ferritin Panel (Completed)   Pernicious anemia   Relevant Orders   Vitamin B12 (Completed)   Other Visit Diagnoses       Hypertension associated with diabetes (HCC)       Relevant Medications   JARDIANCE 25 MG TABS tablet   hydrochlorothiazide (HYDRODIURIL) 25 MG tablet   glipiZIDE  (GLUCOTROL  XL) 5 MG 24 hr tablet   metoprolol  tartrate (LOPRESSOR ) 50 MG tablet       Return in about 6 weeks (around 09/26/2023).   Total time spent: 30 minutes  FERNAND FREDY RAMAN, MD  08/15/2023   This document may have been prepared by [facility] Voice Recognition software and as such may include unintentional dictation errors.

## 2023-08-22 ENCOUNTER — Encounter: Payer: Self-pay | Admitting: Internal Medicine

## 2023-09-26 ENCOUNTER — Encounter: Payer: Self-pay | Admitting: Internal Medicine

## 2023-09-26 ENCOUNTER — Ambulatory Visit (INDEPENDENT_AMBULATORY_CARE_PROVIDER_SITE_OTHER): Admitting: Internal Medicine

## 2023-09-26 VITALS — BP 172/82 | HR 59 | Ht 62.0 in | Wt 224.0 lb

## 2023-09-26 DIAGNOSIS — D510 Vitamin B12 deficiency anemia due to intrinsic factor deficiency: Secondary | ICD-10-CM

## 2023-09-26 DIAGNOSIS — Z794 Long term (current) use of insulin: Secondary | ICD-10-CM

## 2023-09-26 DIAGNOSIS — I152 Hypertension secondary to endocrine disorders: Secondary | ICD-10-CM

## 2023-09-26 DIAGNOSIS — E1169 Type 2 diabetes mellitus with other specified complication: Secondary | ICD-10-CM | POA: Diagnosis not present

## 2023-09-26 DIAGNOSIS — E1159 Type 2 diabetes mellitus with other circulatory complications: Secondary | ICD-10-CM | POA: Insufficient documentation

## 2023-09-26 DIAGNOSIS — E782 Mixed hyperlipidemia: Secondary | ICD-10-CM

## 2023-09-26 DIAGNOSIS — Z1231 Encounter for screening mammogram for malignant neoplasm of breast: Secondary | ICD-10-CM | POA: Insufficient documentation

## 2023-09-26 MED ORDER — BD SYRINGE SLIP TIP 1 ML MISC: 1.0000 mL | 3 refills | Status: AC

## 2023-09-26 NOTE — Progress Notes (Signed)
 Established Patient Office Visit  Subjective:  Patient ID: Lorraine Boone, female    DOB: 1950-05-13  Age: 73 y.o. MRN: [id number]  Chief Complaint  Patient presents with   Follow-up    6 week follow up    Patient is here today to follow up since her new patient appointment. Her blood pressure today is elevated. She does have hypertension. She states her blood pressure this morning was 134/70 with a wrist cuff at home. Here 172/82 .She reports taking her medication as prescribed without side effects. She reports she started her B12 injections at home; reports she has only given herself 1 injection as she thought it was once monthly. Patient educated to her pernicious anemia that she should do weekly injections for 1 month then switch to once monthly B12 injections. Will give patient injection in the office today to get her back on schedule.  Her last HbgA1c in 06/2023 was 8.8%. Routine labs are due today. She reports she takes 45 units of Lantus  each morning and has fasting blood sugars that range from 90-200's. She endorses she eats concentrated sweets daily and will try to reduce her intake of sugar and carbohydrates. Encouraged patient to switch her Lantus  to bedtime and take her other medications in the morning to see if her fasting blood sugars improve.  Patient is still hesitant on mammogram breast cancer screening.     No other concerns at this time.   Past Medical History:  Diagnosis Date   Arthritis    Bradycardia    COPD (chronic obstructive pulmonary disease) (HCC)    Diabetes mellitus type 2, uncomplicated (HCC)    Dyspnea    Hyperlipidemia    Hypertension    Intermittent complete heart block (HCC)    Iron  deficiency anemia due to chronic blood loss 08/18/2018    Past Surgical History:  Procedure Laterality Date   CHOLECYSTECTOMY     COLONOSCOPY WITH PROPOFOL  N/A 08/11/2018   Procedure: COLONOSCOPY WITH PROPOFOL ;  Surgeon: Unk Corinn Skiff, MD;  Location: [facility]  ENDOSCOPY;  Service: Gastroenterology;  Laterality: N/A;   ESOPHAGOGASTRODUODENOSCOPY (EGD) WITH PROPOFOL  N/A 08/11/2018   Procedure: ESOPHAGOGASTRODUODENOSCOPY (EGD) WITH PROPOFOL ;  Surgeon: Unk Corinn Skiff, MD;  Location: [facility] ENDOSCOPY;  Service: Gastroenterology;  Laterality: N/A;   ESSURE TUBAL LIGATION     INSERT / REPLACE / REMOVE PACEMAKER     PACEMAKER INSERTION N/A 12/22/2015   Procedure: INSERTION PACEMAKER;  Surgeon: Marsa Dooms, MD;  Location: [facility] ORS;  Service: Cardiovascular;  Laterality: N/A;   PACEMAKER LEADLESS INSERTION N/A 08/27/2019   Procedure: PACEMAKER LEADLESS INSERTION;  Surgeon: Dooms Marsa, MD;  Location: [facility] INVASIVE CV LAB;  Service: Cardiovascular;  Laterality: N/A;   TEMPORARY PACEMAKER N/A 08/27/2019   Procedure: TEMPORARY PACEMAKER;  Surgeon: Dooms Marsa, MD;  Location: [facility] INVASIVE CV LAB;  Service: Cardiovascular;  Laterality: N/A;    Social History   Socioeconomic History   Marital status: Married    Spouse name: Not on file   Number of children: Not on file   Years of education: Not on file   Highest education level: Not on file  Occupational History   Not on file  Tobacco Use   Smoking status: Former    Current packs/day: 0.00    Average packs/day: 0.3 packs/day for 35.0 years (8.8 ttl pk-yrs)    Types: Cigarettes    Start date: 12/20/1980    Quit date: 12/21/2015    Years since quitting: 7.7   Smokeless tobacco: Never  Substance and Sexual Activity   Alcohol use: No   Drug use: No   Sexual activity: Not on file  Other Topics Concern   Not on file  Social History Narrative   Not on file   Social Drivers of Health   Financial Resource Strain: Not on file  Food Insecurity: Not on file  Transportation Needs: Not on file  Physical Activity: Not on file  Stress: Not on file  Social Connections: Not on file  Intimate Partner Violence: Not on file    Family History  Problem Relation Age of Onset    Diabetes Mother    Heart disease Mother     No Known Allergies  Outpatient Medications Prior to Visit  Medication Sig   acetaminophen  (TYLENOL ) 325 MG tablet Take 650 mg by mouth every 6 (six) hours as needed for moderate pain.    cholecalciferol (VITAMIN D3) 25 MCG (1000 UNIT) tablet Take 1,000 Units by mouth daily.   cyanocobalamin  (VITAMIN B12) 1000 MCG/ML injection Inject 1 mL (1,000 mcg total) into the skin every 30 (thirty) days.   ferrous sulfate  325 (65 FE) MG EC tablet Take 1 tablet (325 mg total) by mouth 3 (three) times daily with meals.   glipiZIDE  (GLUCOTROL  XL) 5 MG 24 hr tablet TAKE 1 TABLET BY MOUTH DAILY   hydrochlorothiazide (HYDRODIURIL) 25 MG tablet Take 25 mg by mouth daily.   insulin  glargine (LANTUS ) 100 UNIT/ML injection Inject 60 Units into the skin daily.  (Patient taking differently: Inject 45 Units into the skin daily.)   Insulin  Syringes, Disposable, U-100 1 ML MISC Inject 1 each into the skin once a week.   ipratropium-albuterol  (DUONEB) 0.5-2.5 (3) MG/3ML SOLN Take 3 mLs by nebulization every 6 (six) hours as needed.   JARDIANCE 25 MG TABS tablet Take 25 mg by mouth daily.   losartan  (COZAAR ) 100 MG tablet Take 100 mg by mouth daily.   metoprolol  tartrate (LOPRESSOR ) 50 MG tablet Take 1 tablet (50 mg total) by mouth 2 (two) times daily.   ONETOUCH ULTRA test strip 1 each 2 (two) times daily.   rosuvastatin  (CRESTOR ) 10 MG tablet Take 10 mg by mouth daily.   WIXELA INHUB 250-50 MCG/DOSE AEPB INHALE 1 PUFF INTO THE LUNGS TWICE DAILY (Patient not taking: Reported on 09/26/2023)   No facility-administered medications prior to visit.    Review of Systems  Constitutional: Negative.   HENT: Negative.    Eyes: Negative.   Respiratory: Negative.  Negative for cough and shortness of breath.   Cardiovascular: Negative.  Negative for chest pain, palpitations and leg swelling.  Gastrointestinal: Negative.  Negative for abdominal pain, constipation, diarrhea,  heartburn, nausea and vomiting.  Genitourinary: Negative.  Negative for dysuria and flank pain.  Musculoskeletal: Negative.  Negative for joint pain and myalgias.  Skin: Negative.   Neurological: Negative.  Negative for dizziness and headaches.  Endo/Heme/Allergies: Negative.   Psychiatric/Behavioral: Negative.  Negative for depression and suicidal ideas. The patient is not nervous/anxious.        Objective:   BP (!) 172/82 (BP Location: Left Arm)   Pulse (!) 59   Ht 5' 2 (1.575 m)   Wt 224 lb (101.6 kg)   SpO2 99%   BMI 40.97 kg/m   Vitals:   09/26/23 1004  BP: (!) 172/82  Pulse: (!) 59  Height: 5' 2 (1.575 m)  Weight: 224 lb (101.6 kg)  SpO2: 99%  BMI (Calculated): 40.96    Physical Exam Vitals and nursing note  reviewed.  Constitutional:      Appearance: Normal appearance.  HENT:     Head: Normocephalic and atraumatic.     Nose: Nose normal.     Mouth/Throat:     Mouth: Mucous membranes are moist.     Pharynx: Oropharynx is clear.  Eyes:     Conjunctiva/sclera: Conjunctivae normal.     Pupils: Pupils are equal, round, and reactive to light.  Cardiovascular:     Rate and Rhythm: Normal rate and regular rhythm.     Pulses: Normal pulses.     Heart sounds: Normal heart sounds. No murmur heard. Pulmonary:     Effort: Pulmonary effort is normal.     Breath sounds: Normal breath sounds. No wheezing.  Abdominal:     General: Bowel sounds are normal.     Palpations: Abdomen is soft.     Tenderness: There is no abdominal tenderness. There is no right CVA tenderness or left CVA tenderness.  Musculoskeletal:        General: Normal range of motion.     Cervical back: Normal range of motion.     Right lower leg: No edema.     Left lower leg: No edema.  Skin:    General: Skin is warm and dry.  Neurological:     General: No focal deficit present.     Mental Status: She is alert and oriented to person, place, and time.  Psychiatric:        Mood and Affect: Mood  normal.        Behavior: Behavior normal.      No results found for any visits on 09/26/23.  Recent Results (from the past 2160 hours)  POCT CBG (Fasting - Glucose)     Status: Abnormal   Collection Time: 08/15/23  9:35 AM  Result Value Ref Range   Glucose Fasting, POC 106 (A) 70 - 99 mg/dL  Vitamin B12     Status: Abnormal   Collection Time: 08/15/23 10:10 AM  Result Value Ref Range   Vitamin B-12 170 (L) 232 - 1,245 pg/mL  CBC with Diff     Status: Abnormal   Collection Time: 08/15/23 10:10 AM  Result Value Ref Range   WBC 10.0 3.4 - 10.8 x10E3/uL   RBC 5.59 (H) 3.77 - 5.28 x10E6/uL   Hemoglobin 14.9 11.1 - 15.9 g/dL   Hematocrit 50.1 (H) 65.9 - 46.6 %   MCV 89 79 - 97 fL   MCH 26.7 26.6 - 33.0 pg   MCHC 29.9 (L) 31.5 - 35.7 g/dL   RDW 85.7 88.2 - 84.5 %   Platelets 279 150 - 450 x10E3/uL   Neutrophils 58 Not Estab. %   Lymphs 32 Not Estab. %   Monocytes 8 Not Estab. %   Eos 2 Not Estab. %   Basos 0 Not Estab. %   Neutrophils Absolute 5.8 1.4 - 7.0 x10E3/uL   Lymphocytes Absolute 3.2 (H) 0.7 - 3.1 x10E3/uL   Monocytes Absolute 0.8 0.1 - 0.9 x10E3/uL   EOS (ABSOLUTE) 0.2 0.0 - 0.4 x10E3/uL   Basophils Absolute 0.0 0.0 - 0.2 x10E3/uL   Immature Granulocytes 0 Not Estab. %   Immature Grans (Abs) 0.0 0.0 - 0.1 x10E3/uL  Iron , TIBC and Ferritin Panel     Status: Abnormal   Collection Time: 08/15/23 10:10 AM  Result Value Ref Range   Total Iron  Binding Capacity 418 250 - 450 ug/dL   UIBC 635 881 - 630 ug/dL   Iron   54 27 - 139 ug/dL   Iron  Saturation 13 (L) 15 - 55 %   Ferritin 15 15 - 150 ng/mL      Assessment & Plan:  Continue taking medications as prescribed. Switch Lantus  to bedtime dosing and monitor fasting blood sugars. Will collect routine blood work and follow up of results with patient. Encouraged patient to get her mammogram. Encouraged strict diet that is low carbohydrate and low concentrated sweets. Problem List Items Addressed This Visit     Diabetes  mellitus type 2, insulin  dependent (HCC)   Relevant Orders   Hemoglobin A1c   Pernicious anemia   Relevant Medications   Syringe, Disposable, (B-D SYRINGE SLIP TIP 1CC) 1 ML MISC   Hypertension associated with diabetes (HCC) - Primary   Relevant Orders   CMP14+EGFR   Combined hyperlipidemia associated with type 2 diabetes mellitus (HCC)   Relevant Orders   Lipid Panel w/o Chol/HDL Ratio   Breast cancer screening by mammogram    Return in about 2 weeks (around 10/10/2023).   Total time spent: 30 minutes  FERNAND FREDY RAMAN, MD  09/26/2023   This document may have been prepared by [facility] Voice Recognition software and as such may include unintentional dictation errors.

## 2023-09-27 ENCOUNTER — Ambulatory Visit: Payer: Self-pay | Admitting: Internal Medicine

## 2023-09-27 LAB — LIPID PANEL W/O CHOL/HDL RATIO
Cholesterol, Total: 157 mg/dL (ref 100–199)
HDL: 49 mg/dL (ref >39)
LDL Chol Calc (NIH): 80 mg/dL (ref 0–99)
Triglycerides: 165 mg/dL — ABNORMAL HIGH (ref 0–149)
VLDL Cholesterol Cal: 28 mg/dL (ref 5–40)

## 2023-09-27 LAB — CMP14+EGFR
ALT: 11 IU/L (ref 0–32)
AST: 19 IU/L (ref 0–40)
Albumin: 4.4 g/dL (ref 3.8–4.8)
Alkaline Phosphatase: 92 IU/L (ref 44–121)
BUN/Creatinine Ratio: 19 (ref 12–28)
BUN: 28 mg/dL — ABNORMAL HIGH (ref 8–27)
Bilirubin Total: 0.5 mg/dL (ref 0.0–1.2)
CO2: 23 mmol/L (ref 20–29)
Calcium: 9.8 mg/dL (ref 8.7–10.3)
Chloride: 104 mmol/L (ref 96–106)
Creatinine, Ser: 1.44 mg/dL — ABNORMAL HIGH (ref 0.57–1.00)
Globulin, Total: 2.9 g/dL (ref 1.5–4.5)
Glucose: 95 mg/dL (ref 70–99)
Potassium: 4.6 mmol/L (ref 3.5–5.2)
Sodium: 143 mmol/L (ref 134–144)
Total Protein: 7.3 g/dL (ref 6.0–8.5)
eGFR: 38 mL/min/1.73 — ABNORMAL LOW (ref >59)

## 2023-09-27 LAB — HEMOGLOBIN A1C
Est. average glucose Bld gHb Est-mCnc: 206 mg/dL
Hgb A1c MFr Bld: 8.8 % — ABNORMAL HIGH (ref 4.8–5.6)

## 2023-10-03 ENCOUNTER — Ambulatory Visit: Payer: Self-pay | Admitting: Internal Medicine

## 2023-10-03 ENCOUNTER — Ambulatory Visit (INDEPENDENT_AMBULATORY_CARE_PROVIDER_SITE_OTHER): Admitting: Internal Medicine

## 2023-10-03 ENCOUNTER — Encounter: Payer: Self-pay | Admitting: Internal Medicine

## 2023-10-03 VITALS — BP 124/84 | HR 63 | Ht 62.0 in | Wt 223.6 lb

## 2023-10-03 DIAGNOSIS — J449 Chronic obstructive pulmonary disease, unspecified: Secondary | ICD-10-CM | POA: Diagnosis not present

## 2023-10-03 DIAGNOSIS — E119 Type 2 diabetes mellitus without complications: Secondary | ICD-10-CM

## 2023-10-03 DIAGNOSIS — Z794 Long term (current) use of insulin: Secondary | ICD-10-CM

## 2023-10-03 DIAGNOSIS — Z95811 Presence of heart assist device: Secondary | ICD-10-CM | POA: Diagnosis not present

## 2023-10-03 DIAGNOSIS — E782 Mixed hyperlipidemia: Secondary | ICD-10-CM

## 2023-10-03 DIAGNOSIS — D510 Vitamin B12 deficiency anemia due to intrinsic factor deficiency: Secondary | ICD-10-CM

## 2023-10-03 DIAGNOSIS — E1169 Type 2 diabetes mellitus with other specified complication: Secondary | ICD-10-CM

## 2023-10-03 DIAGNOSIS — J9611 Chronic respiratory failure with hypoxia: Secondary | ICD-10-CM | POA: Insufficient documentation

## 2023-10-03 DIAGNOSIS — I152 Hypertension secondary to endocrine disorders: Secondary | ICD-10-CM

## 2023-10-03 DIAGNOSIS — E1159 Type 2 diabetes mellitus with other circulatory complications: Secondary | ICD-10-CM

## 2023-10-03 LAB — POCT CBG (FASTING - GLUCOSE)-MANUAL ENTRY: Glucose Fasting, POC: 188 mg/dL — AB (ref 70–99)

## 2023-10-03 MED ORDER — NOVOLIN 70/30 FLEXPEN (70-30) 100 UNIT/ML ~~LOC~~ SUPN: PEN_INJECTOR | SUBCUTANEOUS | 11 refills | Status: DC

## 2023-10-03 NOTE — Progress Notes (Signed)
 Established Patient Office Visit  Subjective:  Patient ID: Lorraine Boone, female    DOB: 11-14-1950  Age: 73 y.o. MRN: [id number]  Chief Complaint  Patient presents with   Follow-up    2 week follow up    Patient is here today to FU on her recent Lantus  45 units dosing schedule change. Patient reports her fasting blood glucose in the mornings are below 110 but are between 190-200s prior to dinner. Will switch patient to Novolog  70/30 twice a day to ensure full 24 hour insulin  coverage. Patient is interested in GLP-1 therapy but does not want to start two new medications at the same time. Will discuss at FU appointment depending on how her fasting blood glucose levels are on her Novolog  70/30. Her last 2 HbgA1c were 8.8%. Encouraged continuing strict dietary control of excessive carbohydrates and concentrated sweets to reduce hyperglycemia. Patient denies increased hunger, thirst, and urinary symptoms.  At today's visit patients blood pressure reading is normal. She has lost 1 lb since her last visit. She is due for her next B12 injection today. Patient has no new complaints. She denies headache, chest pain, shortness of breath, nausea, vomiting/constipation at this time. Patient is not due for labs as they were just performed recently. Will recheck them in December 2025.    No other concerns at this time.   Past Medical History:  Diagnosis Date   Arthritis    Bradycardia    COPD (chronic obstructive pulmonary disease) (HCC)    Diabetes mellitus type 2, uncomplicated (HCC)    Dyspnea    Hyperlipidemia    Hypertension    Intermittent complete heart block (HCC)    Iron  deficiency anemia due to chronic blood loss 08/18/2018    Past Surgical History:  Procedure Laterality Date   CHOLECYSTECTOMY     COLONOSCOPY WITH PROPOFOL  N/A 08/11/2018   Procedure: COLONOSCOPY WITH PROPOFOL ;  Surgeon: Unk Corinn Skiff, MD;  Location: [facility];  Service: Gastroenterology;  Laterality: N/A;    ESOPHAGOGASTRODUODENOSCOPY (EGD) WITH PROPOFOL  N/A 08/11/2018   Procedure: ESOPHAGOGASTRODUODENOSCOPY (EGD) WITH PROPOFOL ;  Surgeon: Unk Corinn Skiff, MD;  Location: [facility];  Service: Gastroenterology;  Laterality: N/A;   ESSURE TUBAL LIGATION     INSERT / REPLACE / REMOVE PACEMAKER     PACEMAKER INSERTION N/A 12/22/2015   Procedure: INSERTION PACEMAKER;  Surgeon: Marsa Dooms, MD;  Location: [facility];  Service: Cardiovascular;  Laterality: N/A;   PACEMAKER LEADLESS INSERTION N/A 08/27/2019   Procedure: PACEMAKER LEADLESS INSERTION;  Surgeon: Dooms Marsa, MD;  Location: [facility];  Service: Cardiovascular;  Laterality: N/A;   TEMPORARY PACEMAKER N/A 08/27/2019   Procedure: TEMPORARY PACEMAKER;  Surgeon: Dooms Marsa, MD;  Location: [facility];  Service: Cardiovascular;  Laterality: N/A;    Social History   Socioeconomic History   Marital status: Married    Spouse name: Not on file   Number of children: Not on file   Years of education: Not on file   Highest education level: Not on file  Occupational History   Not on file  Tobacco Use   Smoking status: Former    Current packs/day: 0.00    Average packs/day: 0.3 packs/day for 35.0 years (8.8 ttl pk-yrs)    Types: Cigarettes    Start date: 12/20/1980    Quit date: 12/21/2015    Years since quitting: 7.7   Smokeless tobacco: Never  Substance and Sexual Activity   Alcohol use: No   Drug use: No  Sexual activity: Not on file  Other Topics Concern   Not on file  Social History Narrative   Not on file   Social Drivers of Health   Financial Resource Strain: Not on file  Food Insecurity: Not on file  Transportation Needs: Not on file  Physical Activity: Not on file  Stress: Not on file  Social Connections: Not on file  Intimate Partner Violence: Not on file    Family History  Problem Relation Age of Onset   Diabetes Mother    Heart disease Mother     No Known  Allergies  Outpatient Medications Prior to Visit  Medication Sig Note   acetaminophen  (TYLENOL ) 325 MG tablet Take 650 mg by mouth every 6 (six) hours as needed for moderate pain.     cholecalciferol (VITAMIN D3) 25 MCG (1000 UNIT) tablet Take 1,000 Units by mouth daily.    cyanocobalamin  (VITAMIN B12) 1000 MCG/ML injection Inject 1 mL (1,000 mcg total) into the skin every 30 (thirty) days.    ferrous sulfate  325 (65 FE) MG EC tablet Take 1 tablet (325 mg total) by mouth 3 (three) times daily with meals.    glipiZIDE  (GLUCOTROL  XL) 5 MG 24 hr tablet TAKE 1 TABLET BY MOUTH DAILY    hydrochlorothiazide (HYDRODIURIL) 25 MG tablet Take 25 mg by mouth daily.    Insulin  Syringes, Disposable, U-100 1 ML MISC Inject 1 each into the skin once a week.    ipratropium-albuterol  (DUONEB) 0.5-2.5 (3) MG/3ML SOLN Take 3 mLs by nebulization every 6 (six) hours as needed.    JARDIANCE 25 MG TABS tablet Take 25 mg by mouth daily.    losartan  (COZAAR ) 100 MG tablet Take 100 mg by mouth daily.    metoprolol  tartrate (LOPRESSOR ) 50 MG tablet Take 1 tablet (50 mg total) by mouth 2 (two) times daily.    ONETOUCH ULTRA test strip 1 each 2 (two) times daily.    rosuvastatin  (CRESTOR ) 10 MG tablet Take 10 mg by mouth daily.    Syringe, Disposable, (B-D SYRINGE SLIP TIP 1CC) 1 ML MISC 1 mL by Does not apply route once a week.    [DISCONTINUED] insulin  glargine (LANTUS ) 100 UNIT/ML injection Inject 60 Units into the skin daily.  (Patient taking differently: Inject 45 Units into the skin daily.) 10/03/2023: switching to novolog  70/30   WIXELA INHUB 250-50 MCG/DOSE AEPB INHALE 1 PUFF INTO THE LUNGS TWICE DAILY (Patient not taking: Reported on 10/03/2023)    No facility-administered medications prior to visit.    Review of Systems  Constitutional: Negative.  Negative for chills, diaphoresis, fever, malaise/fatigue and weight loss.  HENT: Negative.  Negative for sore throat.   Eyes: Negative.   Respiratory: Negative.   Negative for cough and shortness of breath.   Cardiovascular: Negative.  Negative for chest pain, palpitations and leg swelling.  Gastrointestinal: Negative.  Negative for abdominal pain, constipation, diarrhea, heartburn, nausea and vomiting.  Genitourinary: Negative.  Negative for dysuria and flank pain.  Musculoskeletal: Negative.  Negative for joint pain and myalgias.  Skin: Negative.   Neurological: Negative.  Negative for dizziness, tingling, tremors and headaches.  Endo/Heme/Allergies: Negative.   Psychiatric/Behavioral: Negative.  Negative for depression and suicidal ideas. The patient is not nervous/anxious.        Objective:   BP 124/84   Pulse 63   Ht 5' 2 (1.575 m)   Wt 223 lb 9.6 oz (101.4 kg)   SpO2 97%   BMI 40.90 kg/m   Vitals:  10/03/23 0959  BP: 124/84  Pulse: 63  Height: 5' 2 (1.575 m)  Weight: 223 lb 9.6 oz (101.4 kg)  SpO2: 97%  BMI (Calculated): 40.89    Physical Exam Vitals and nursing note reviewed.  Constitutional:      Appearance: Normal appearance.  HENT:     Head: Normocephalic and atraumatic.     Nose: Nose normal.     Mouth/Throat:     Mouth: Mucous membranes are moist.     Pharynx: Oropharynx is clear.  Eyes:     Conjunctiva/sclera: Conjunctivae normal.     Pupils: Pupils are equal, round, and reactive to light.  Cardiovascular:     Rate and Rhythm: Normal rate and regular rhythm.     Pulses: Normal pulses.     Heart sounds: Normal heart sounds. No murmur heard. Pulmonary:     Effort: Pulmonary effort is normal.     Breath sounds: Normal breath sounds. No wheezing.  Abdominal:     General: Bowel sounds are normal.     Palpations: Abdomen is soft.     Tenderness: There is no abdominal tenderness. There is no right CVA tenderness or left CVA tenderness.  Musculoskeletal:        General: Normal range of motion.     Cervical back: Normal range of motion.     Right lower leg: No edema.     Left lower leg: No edema.  Skin:     General: Skin is warm and dry.  Neurological:     General: No focal deficit present.     Mental Status: She is alert and oriented to person, place, and time.  Psychiatric:        Mood and Affect: Mood normal.        Behavior: Behavior normal.      Results for orders placed or performed in visit on 10/03/23  POCT CBG (Fasting - Glucose)  Result Value Ref Range   Glucose Fasting, POC 188 (A) 70 - 99 mg/dL    Recent Results (from the past 2160 hours)  POCT CBG (Fasting - Glucose)     Status: Abnormal   Collection Time: 08/15/23  9:35 AM  Result Value Ref Range   Glucose Fasting, POC 106 (A) 70 - 99 mg/dL  Vitamin B12     Status: Abnormal   Collection Time: 08/15/23 10:10 AM  Result Value Ref Range   Vitamin B-12 170 (L) 232 - 1,245 pg/mL  CBC with Diff     Status: Abnormal   Collection Time: 08/15/23 10:10 AM  Result Value Ref Range   WBC 10.0 3.4 - 10.8 x10E3/uL   RBC 5.59 (H) 3.77 - 5.28 x10E6/uL   Hemoglobin 14.9 11.1 - 15.9 g/dL   Hematocrit 50.1 (H) 65.9 - 46.6 %   MCV 89 79 - 97 fL   MCH 26.7 26.6 - 33.0 pg   MCHC 29.9 (L) 31.5 - 35.7 g/dL   RDW 85.7 88.2 - 84.5 %   Platelets 279 150 - 450 x10E3/uL   Neutrophils 58 Not Estab. %   Lymphs 32 Not Estab. %   Monocytes 8 Not Estab. %   Eos 2 Not Estab. %   Basos 0 Not Estab. %   Neutrophils Absolute 5.8 1.4 - 7.0 x10E3/uL   Lymphocytes Absolute 3.2 (H) 0.7 - 3.1 x10E3/uL   Monocytes Absolute 0.8 0.1 - 0.9 x10E3/uL   EOS (ABSOLUTE) 0.2 0.0 - 0.4 x10E3/uL   Basophils Absolute 0.0 0.0 - 0.2 x10E3/uL  Immature Granulocytes 0 Not Estab. %   Immature Grans (Abs) 0.0 0.0 - 0.1 x10E3/uL  Iron , TIBC and Ferritin Panel     Status: Abnormal   Collection Time: 08/15/23 10:10 AM  Result Value Ref Range   Total Iron  Binding Capacity 418 250 - 450 ug/dL   UIBC 635 881 - 630 ug/dL   Iron  54 27 - 139 ug/dL   Iron  Saturation 13 (L) 15 - 55 %   Ferritin 15 15 - 150 ng/mL  CMP14+EGFR     Status: Abnormal   Collection Time:  09/26/23 11:35 AM  Result Value Ref Range   Glucose 95 70 - 99 mg/dL   BUN 28 (H) 8 - 27 mg/dL   Creatinine, Ser 8.55 (H) 0.57 - 1.00 mg/dL   eGFR 38 (L) >40 fO/fpw/8.26   BUN/Creatinine Ratio 19 12 - 28   Sodium 143 134 - 144 mmol/L   Potassium 4.6 3.5 - 5.2 mmol/L   Chloride 104 96 - 106 mmol/L   CO2 23 20 - 29 mmol/L   Calcium  9.8 8.7 - 10.3 mg/dL   Total Protein 7.3 6.0 - 8.5 g/dL   Albumin 4.4 3.8 - 4.8 g/dL   Globulin, Total 2.9 1.5 - 4.5 g/dL   Bilirubin Total 0.5 0.0 - 1.2 mg/dL   Alkaline Phosphatase 92 44 - 121 IU/L    Comment: **Effective October 07, 2023 Alkaline Phosphatase**   reference interval will be changing to:              Age                Female          Female           0 -  5 days         47 - 127       47 - 127           6 - 10 days         29 - 242       29 - 242          11 - 20 days        109 - 357      109 - 357          21 - 30 days         94 - 494       94 - 494           1 -  2 months      149 - 539      149 - 539           3 -  6 months      131 - 452      131 - 452           7 - 11 months      117 - 401      117 - 401   12 months -  6 years       158 - 369      158 - 369           7 - 12 years       150 - 409      150 - 409               13 years       156 - 435  78 - 227               14 years       114 - 375       64 - 161               15 years        88 - 279       56 - 134               16 years        74 - 207       51 - 121               17 years        63 - 161       47 - 113          18 - 20 years        51 - 125       42 - 106          21 - 50 years         47 - 123       41 - 116          51 - 80 years        49 - 135       51 - 125              >80 years        48 - 129       48 - 129    AST 19 0 - 40 IU/L   ALT 11 0 - 32 IU/L  Lipid Panel w/o Chol/HDL Ratio     Status: Abnormal   Collection Time: 09/26/23 11:35 AM  Result Value Ref Range   Cholesterol, Total 157 100 - 199 mg/dL   Triglycerides 834 (H) 0 - 149 mg/dL    HDL 49 >60 mg/dL   VLDL Cholesterol Cal 28 5 - 40 mg/dL   LDL Chol Calc (NIH) 80 0 - 99 mg/dL  Hemoglobin J8r     Status: Abnormal   Collection Time: 09/26/23 11:35 AM  Result Value Ref Range   Hgb A1c MFr Bld 8.8 (H) 4.8 - 5.6 %    Comment:          Prediabetes: 5.7 - 6.4          Diabetes: >6.4          Glycemic control for adults with diabetes: <7.0    Est. average glucose Bld gHb Est-mCnc 206 mg/dL  POCT CBG (Fasting - Glucose)     Status: Abnormal   Collection Time: 10/03/23 10:07 AM  Result Value Ref Range   Glucose Fasting, POC 188 (A) 70 - 99 mg/dL      Assessment & Plan:  Stop Lantus  45 units. Start taking Novolog  70/30 twice daily; 30 units in the morning and 25 units at bedtime. Continue checking fasting blood sugars at home to monitor blood sugars. Notify office if fasting blood sugars stay above 200. Continue taking other medications as prescribed. Problem List Items Addressed This Visit     Diabetes mellitus type 2, insulin  dependent (HCC) - Primary   Relevant Medications   insulin  isophane & regular human KwikPen (NOVOLIN  70/30 KWIKPEN) (70-30) 100 UNIT/ML KwikPen   Other Relevant Orders   POCT CBG (Fasting - Glucose) (Completed)   Pernicious anemia   Hypertension associated with diabetes (HCC)  Relevant Medications   insulin  isophane & regular human KwikPen (NOVOLIN  70/30 KWIKPEN) (70-30) 100 UNIT/ML KwikPen   Combined hyperlipidemia associated with type 2 diabetes mellitus (HCC)   Relevant Medications   insulin  isophane & regular human KwikPen (NOVOLIN  70/30 KWIKPEN) (70-30) 100 UNIT/ML KwikPen   Presence of heart assist device (HCC)   Chronic obstructive pulmonary disease, unspecified COPD type (HCC)   Morbid obesity (HCC)   Relevant Medications   insulin  isophane & regular human KwikPen (NOVOLIN  70/30 KWIKPEN) (70-30) 100 UNIT/ML KwikPen   Chronic respiratory failure with hypoxia (HCC)    Return in about 2 weeks (around 10/17/2023).   Total time spent:  30 minutes  FERNAND FREDY RAMAN, MD  10/03/2023   This document may have been prepared by [facility] Voice Recognition software and as such may include unintentional dictation errors.

## 2023-10-10 ENCOUNTER — Ambulatory Visit: Admitting: Internal Medicine

## 2023-10-17 ENCOUNTER — Encounter: Payer: Self-pay | Admitting: Internal Medicine

## 2023-10-17 ENCOUNTER — Ambulatory Visit (INDEPENDENT_AMBULATORY_CARE_PROVIDER_SITE_OTHER): Admitting: Internal Medicine

## 2023-10-17 ENCOUNTER — Ambulatory Visit: Payer: Self-pay | Admitting: Internal Medicine

## 2023-10-17 VITALS — BP 140/82 | HR 58 | Ht 62.0 in | Wt 225.4 lb

## 2023-10-17 DIAGNOSIS — E782 Mixed hyperlipidemia: Secondary | ICD-10-CM | POA: Diagnosis not present

## 2023-10-17 DIAGNOSIS — E119 Type 2 diabetes mellitus without complications: Secondary | ICD-10-CM

## 2023-10-17 DIAGNOSIS — J449 Chronic obstructive pulmonary disease, unspecified: Secondary | ICD-10-CM | POA: Diagnosis not present

## 2023-10-17 DIAGNOSIS — I152 Hypertension secondary to endocrine disorders: Secondary | ICD-10-CM

## 2023-10-17 DIAGNOSIS — E1169 Type 2 diabetes mellitus with other specified complication: Secondary | ICD-10-CM

## 2023-10-17 DIAGNOSIS — Z794 Long term (current) use of insulin: Secondary | ICD-10-CM

## 2023-10-17 LAB — POC CREATINE & ALBUMIN,URINE
Creatinine, POC: 50 mg/dL
Microalbumin Ur, POC: 30 mg/L

## 2023-10-17 LAB — POCT CBG (FASTING - GLUCOSE)-MANUAL ENTRY: Glucose Fasting, POC: 170 mg/dL — AB (ref 70–99)

## 2023-10-17 NOTE — Progress Notes (Signed)
 Established Patient Office Visit  Subjective:  Patient ID: Lorraine Boone, female    DOB: Jun 30, 1950  Age: 73 y.o. MRN: [id number]  Chief Complaint  Patient presents with   Follow-up    2 week follow up    Patient comes in for follow up today. Her insulin  regimen was switched to Novolog  Mix 70/30 at last visit with instructions to use 30 units in AM and 25 units at night. She brings in a record of her fingerstick glucose which shows marked improvement but still has some elevated readings- will adjust dose to 35 am /30 pm, along with strict diet control. No other complaints. Will check urine microalb.today. BP is up today, admits to drinking strong coffee this morning, had canned soup last night.     No other concerns at this time.   Past Medical History:  Diagnosis Date   Arthritis    Bradycardia    COPD (chronic obstructive pulmonary disease) (HCC)    Diabetes mellitus type 2, uncomplicated (HCC)    Dyspnea    Hyperlipidemia    Hypertension    Intermittent complete heart block (HCC)    Iron  deficiency anemia due to chronic blood loss 08/18/2018    Past Surgical History:  Procedure Laterality Date   CHOLECYSTECTOMY     COLONOSCOPY WITH PROPOFOL  N/A 08/11/2018   Procedure: COLONOSCOPY WITH PROPOFOL ;  Surgeon: Unk Corinn Skiff, MD;  Location: [facility];  Service: Gastroenterology;  Laterality: N/A;   ESOPHAGOGASTRODUODENOSCOPY (EGD) WITH PROPOFOL  N/A 08/11/2018   Procedure: ESOPHAGOGASTRODUODENOSCOPY (EGD) WITH PROPOFOL ;  Surgeon: Unk Corinn Skiff, MD;  Location: [facility];  Service: Gastroenterology;  Laterality: N/A;   ESSURE TUBAL LIGATION     INSERT / REPLACE / REMOVE PACEMAKER     PACEMAKER INSERTION N/A 12/22/2015   Procedure: INSERTION PACEMAKER;  Surgeon: Marsa Dooms, MD;  Location: [facility];  Service: Cardiovascular;  Laterality: N/A;   PACEMAKER LEADLESS INSERTION N/A 08/27/2019   Procedure: PACEMAKER LEADLESS INSERTION;  Surgeon: Dooms Marsa, MD;  Location: [facility];  Service: Cardiovascular;  Laterality: N/A;   TEMPORARY PACEMAKER N/A 08/27/2019   Procedure: TEMPORARY PACEMAKER;  Surgeon: Dooms Marsa, MD;  Location: [facility];  Service: Cardiovascular;  Laterality: N/A;    Social History   Socioeconomic History   Marital status: Married    Spouse name: Not on file   Number of children: Not on file   Years of education: Not on file   Highest education level: Not on file  Occupational History   Not on file  Tobacco Use   Smoking status: Former    Current packs/day: 0.00    Average packs/day: 0.3 packs/day for 35.0 years (8.8 ttl pk-yrs)    Types: Cigarettes    Start date: 12/20/1980    Quit date: 12/21/2015    Years since quitting: 7.8   Smokeless tobacco: Never  Substance and Sexual Activity   Alcohol use: No   Drug use: No   Sexual activity: Not on file  Other Topics Concern   Not on file  Social History Narrative   Not on file   Social Drivers of Health   Financial Resource Strain: Not on file  Food Insecurity: Not on file  Transportation Needs: Not on file  Physical Activity: Not on file  Stress: Not on file  Social Connections: Not on file  Intimate Partner Violence: Not on file    Family History  Problem Relation Age of Onset   Diabetes Mother  Heart disease Mother     No Known Allergies  Outpatient Medications Prior to Visit  Medication Sig   acetaminophen  (TYLENOL ) 325 MG tablet Take 650 mg by mouth every 6 (six) hours as needed for moderate pain.    cholecalciferol (VITAMIN D3) 25 MCG (1000 UNIT) tablet Take 1,000 Units by mouth daily.   cyanocobalamin  (VITAMIN B12) 1000 MCG/ML injection Inject 1 mL (1,000 mcg total) into the skin every 30 (thirty) days.   ferrous sulfate  325 (65 FE) MG EC tablet Take 1 tablet (325 mg total) by mouth 3 (three) times daily with meals.   glipiZIDE  (GLUCOTROL  XL) 5 MG 24 hr tablet TAKE 1 TABLET BY MOUTH DAILY    hydrochlorothiazide (HYDRODIURIL) 25 MG tablet Take 25 mg by mouth daily.   insulin  isophane & regular human KwikPen (NOVOLIN  70/30 KWIKPEN) (70-30) 100 UNIT/ML KwikPen Inject 30 units in morning, inject 25 units at bedtime   Insulin  Syringes, Disposable, U-100 1 ML MISC Inject 1 each into the skin once a week.   ipratropium-albuterol  (DUONEB) 0.5-2.5 (3) MG/3ML SOLN Take 3 mLs by nebulization every 6 (six) hours as needed.   JARDIANCE 25 MG TABS tablet Take 25 mg by mouth daily.   losartan  (COZAAR ) 100 MG tablet Take 100 mg by mouth daily.   metoprolol  tartrate (LOPRESSOR ) 50 MG tablet Take 1 tablet (50 mg total) by mouth 2 (two) times daily.   ONETOUCH ULTRA test strip 1 each 2 (two) times daily.   rosuvastatin  (CRESTOR ) 10 MG tablet Take 10 mg by mouth daily.   Syringe, Disposable, (B-D SYRINGE SLIP TIP 1CC) 1 ML MISC 1 mL by Does not apply route once a week.   WIXELA INHUB 250-50 MCG/DOSE AEPB INHALE 1 PUFF INTO THE LUNGS TWICE DAILY (Patient not taking: Reported on 10/17/2023)   No facility-administered medications prior to visit.    Review of Systems  Constitutional: Negative.  Negative for chills, fever and malaise/fatigue.  HENT: Negative.  Negative for congestion and sore throat.   Eyes: Negative.  Negative for blurred vision and pain.  Respiratory: Negative.  Negative for cough and shortness of breath.   Cardiovascular: Negative.  Negative for chest pain, palpitations and leg swelling.  Gastrointestinal: Negative.  Negative for abdominal pain, blood in stool, constipation, diarrhea, heartburn, melena, nausea and vomiting.  Genitourinary: Negative.  Negative for dysuria, flank pain, frequency and urgency.  Musculoskeletal: Negative.  Negative for joint pain and myalgias.  Skin: Negative.   Neurological: Negative.  Negative for dizziness, tingling, sensory change, weakness and headaches.  Endo/Heme/Allergies: Negative.   Psychiatric/Behavioral: Negative.  Negative for depression and  suicidal ideas. The patient is not nervous/anxious.        Objective:   BP (!) 140/82   Pulse (!) 58   Ht 5' 2 (1.575 m)   Wt 225 lb 6.4 oz (102.2 kg)   SpO2 97%   BMI 41.23 kg/m   Vitals:   10/17/23 1131  BP: (!) 140/82  Pulse: (!) 58  Height: 5' 2 (1.575 m)  Weight: 225 lb 6.4 oz (102.2 kg)  SpO2: 97%  BMI (Calculated): 41.22    Physical Exam Vitals and nursing note reviewed.  Constitutional:      Appearance: Normal appearance.  HENT:     Head: Normocephalic and atraumatic.     Nose: Nose normal.     Mouth/Throat:     Mouth: Mucous membranes are moist.     Pharynx: Oropharynx is clear.  Eyes:     Conjunctiva/sclera: Conjunctivae  normal.     Pupils: Pupils are equal, round, and reactive to light.  Cardiovascular:     Rate and Rhythm: Normal rate and regular rhythm.     Pulses: Normal pulses.     Heart sounds: Normal heart sounds. No murmur heard. Pulmonary:     Effort: Pulmonary effort is normal.     Breath sounds: Normal breath sounds. No wheezing.  Abdominal:     General: Bowel sounds are normal.     Palpations: Abdomen is soft.     Tenderness: There is no abdominal tenderness. There is no right CVA tenderness or left CVA tenderness.  Musculoskeletal:        General: Normal range of motion.     Cervical back: Normal range of motion.     Right lower leg: No edema.     Left lower leg: No edema.  Skin:    General: Skin is warm and dry.  Neurological:     General: No focal deficit present.     Mental Status: She is alert and oriented to person, place, and time.  Psychiatric:        Mood and Affect: Mood normal.        Behavior: Behavior normal.      Results for orders placed or performed in visit on 10/17/23  POCT CBG (Fasting - Glucose)  Result Value Ref Range   Glucose Fasting, POC 170 (A) 70 - 99 mg/dL    Recent Results (from the past 2160 hours)  POCT CBG (Fasting - Glucose)     Status: Abnormal   Collection Time: 08/15/23  9:35 AM   Result Value Ref Range   Glucose Fasting, POC 106 (A) 70 - 99 mg/dL  Vitamin B12     Status: Abnormal   Collection Time: 08/15/23 10:10 AM  Result Value Ref Range   Vitamin B-12 170 (L) 232 - 1,245 pg/mL  CBC with Diff     Status: Abnormal   Collection Time: 08/15/23 10:10 AM  Result Value Ref Range   WBC 10.0 3.4 - 10.8 x10E3/uL   RBC 5.59 (H) 3.77 - 5.28 x10E6/uL   Hemoglobin 14.9 11.1 - 15.9 g/dL   Hematocrit 50.1 (H) 65.9 - 46.6 %   MCV 89 79 - 97 fL   MCH 26.7 26.6 - 33.0 pg   MCHC 29.9 (L) 31.5 - 35.7 g/dL   RDW 85.7 88.2 - 84.5 %   Platelets 279 150 - 450 x10E3/uL   Neutrophils 58 Not Estab. %   Lymphs 32 Not Estab. %   Monocytes 8 Not Estab. %   Eos 2 Not Estab. %   Basos 0 Not Estab. %   Neutrophils Absolute 5.8 1.4 - 7.0 x10E3/uL   Lymphocytes Absolute 3.2 (H) 0.7 - 3.1 x10E3/uL   Monocytes Absolute 0.8 0.1 - 0.9 x10E3/uL   EOS (ABSOLUTE) 0.2 0.0 - 0.4 x10E3/uL   Basophils Absolute 0.0 0.0 - 0.2 x10E3/uL   Immature Granulocytes 0 Not Estab. %   Immature Grans (Abs) 0.0 0.0 - 0.1 x10E3/uL  Iron , TIBC and Ferritin Panel     Status: Abnormal   Collection Time: 08/15/23 10:10 AM  Result Value Ref Range   Total Iron  Binding Capacity 418 250 - 450 ug/dL   UIBC 635 881 - 630 ug/dL   Iron  54 27 - 139 ug/dL   Iron  Saturation 13 (L) 15 - 55 %   Ferritin 15 15 - 150 ng/mL  CMP14+EGFR     Status: Abnormal  Collection Time: 09/26/23 11:35 AM  Result Value Ref Range   Glucose 95 70 - 99 mg/dL   BUN 28 (H) 8 - 27 mg/dL   Creatinine, Ser 8.55 (H) 0.57 - 1.00 mg/dL   eGFR 38 (L) >40 fO/fpw/8.26   BUN/Creatinine Ratio 19 12 - 28   Sodium 143 134 - 144 mmol/L   Potassium 4.6 3.5 - 5.2 mmol/L   Chloride 104 96 - 106 mmol/L   CO2 23 20 - 29 mmol/L   Calcium  9.8 8.7 - 10.3 mg/dL   Total Protein 7.3 6.0 - 8.5 g/dL   Albumin 4.4 3.8 - 4.8 g/dL   Globulin, Total 2.9 1.5 - 4.5 g/dL   Bilirubin Total 0.5 0.0 - 1.2 mg/dL   Alkaline Phosphatase 92 44 - 121 IU/L    Comment:  **Effective October 07, 2023 Alkaline Phosphatase**   reference interval will be changing to:              Age                Female          Female           0 -  5 days         47 - 127       47 - 127           6 - 10 days         29 - 242       29 - 242          11 - 20 days        109 - 357      109 - 357          21 - 30 days         94 - 494       94 - 494           1 -  2 months      149 - 539      149 - 539           3 -  6 months      131 - 452      131 - 452           7 - 11 months      117 - 401      117 - 401   12 months -  6 years       158 - 369      158 - 369           7 - 12 years       150 - 409      150 - 409               13 years       156 - 435       78 - 227               14 years       114 - 375       64 - 161               15 years        88 - 279       56 - 134               16 years  74 - 207       51 - 121               17 years        63 - 161       47 - 113          18 - 20 years        51 - 125       42 - 106          21 - 50 years         47 - 123       41 - 116          51 - 80 years        49 - 135       51 - 125              >80 years        48 - 129       48 - 129    AST 19 0 - 40 IU/L   ALT 11 0 - 32 IU/L  Lipid Panel w/o Chol/HDL Ratio     Status: Abnormal   Collection Time: 09/26/23 11:35 AM  Result Value Ref Range   Cholesterol, Total 157 100 - 199 mg/dL   Triglycerides 834 (H) 0 - 149 mg/dL   HDL 49 >60 mg/dL   VLDL Cholesterol Cal 28 5 - 40 mg/dL   LDL Chol Calc (NIH) 80 0 - 99 mg/dL  Hemoglobin J8r     Status: Abnormal   Collection Time: 09/26/23 11:35 AM  Result Value Ref Range   Hgb A1c MFr Bld 8.8 (H) 4.8 - 5.6 %    Comment:          Prediabetes: 5.7 - 6.4          Diabetes: >6.4          Glycemic control for adults with diabetes: <7.0    Est. average glucose Bld gHb Est-mCnc 206 mg/dL  POCT CBG (Fasting - Glucose)     Status: Abnormal   Collection Time: 10/03/23 10:07 AM  Result Value Ref Range   Glucose Fasting, POC 188  (A) 70 - 99 mg/dL  POCT CBG (Fasting - Glucose)     Status: Abnormal   Collection Time: 10/17/23 11:38 AM  Result Value Ref Range   Glucose Fasting, POC 170 (A) 70 - 99 mg/dL      Assessment & Plan:  Novolog  mix adjusted to 35 / 30. Strict diet control. Patient wants to wait on GLP-1. Problem List Items Addressed This Visit     Diabetes mellitus type 2, insulin  dependent (HCC) - Primary   Relevant Orders   POCT CBG (Fasting - Glucose) (Completed)   Hypertension associated with diabetes (HCC)   Combined hyperlipidemia associated with type 2 diabetes mellitus (HCC)   Chronic obstructive pulmonary disease, unspecified COPD type (HCC)   Morbid obesity (HCC)    Return in about 3 weeks (around 11/07/2023).   Total time spent: 30 minutes  FERNAND FREDY RAMAN, MD  10/17/2023   This document may have been prepared by [facility] Voice Recognition software and as such may include unintentional dictation errors.

## 2023-10-28 ENCOUNTER — Other Ambulatory Visit: Payer: Self-pay | Admitting: Internal Medicine

## 2023-10-28 MED ORDER — JARDIANCE 25 MG PO TABS: 25.0000 mg | ORAL_TABLET | Freq: Every day | ORAL | 2 refills | Status: DC

## 2023-11-05 ENCOUNTER — Telehealth: Payer: Self-pay

## 2023-11-05 ENCOUNTER — Observation Stay: Admitting: Anesthesiology

## 2023-11-05 ENCOUNTER — Observation Stay
Admission: EM | Admit: 2023-11-05 | Discharge: 2023-11-08 | Disposition: A | Discharge status: Skilled Nursing Facility | Attending: General Surgery | Admitting: General Surgery

## 2023-11-05 ENCOUNTER — Encounter
Admission: EM | Disposition: A | Discharge status: Skilled Nursing Facility | Payer: Self-pay | Source: Home / Self Care | Attending: Emergency Medicine

## 2023-11-05 ENCOUNTER — Emergency Department

## 2023-11-05 ENCOUNTER — Other Ambulatory Visit: Payer: Self-pay

## 2023-11-05 DIAGNOSIS — Z79899 Other long term (current) drug therapy: Secondary | ICD-10-CM | POA: Diagnosis not present

## 2023-11-05 DIAGNOSIS — E871 Hypo-osmolality and hyponatremia: Secondary | ICD-10-CM

## 2023-11-05 DIAGNOSIS — E1122 Type 2 diabetes mellitus with diabetic chronic kidney disease: Secondary | ICD-10-CM | POA: Diagnosis not present

## 2023-11-05 DIAGNOSIS — K56609 Unspecified intestinal obstruction, unspecified as to partial versus complete obstruction: Secondary | ICD-10-CM | POA: Diagnosis not present

## 2023-11-05 DIAGNOSIS — I509 Heart failure, unspecified: Secondary | ICD-10-CM | POA: Diagnosis not present

## 2023-11-05 DIAGNOSIS — N1832 Chronic kidney disease, stage 3b: Secondary | ICD-10-CM | POA: Diagnosis present

## 2023-11-05 DIAGNOSIS — N179 Acute kidney failure, unspecified: Secondary | ICD-10-CM | POA: Diagnosis not present

## 2023-11-05 DIAGNOSIS — N3000 Acute cystitis without hematuria: Secondary | ICD-10-CM | POA: Diagnosis not present

## 2023-11-05 DIAGNOSIS — Z794 Long term (current) use of insulin: Secondary | ICD-10-CM | POA: Diagnosis not present

## 2023-11-05 DIAGNOSIS — J449 Chronic obstructive pulmonary disease, unspecified: Secondary | ICD-10-CM | POA: Diagnosis present

## 2023-11-05 DIAGNOSIS — I130 Hypertensive heart and chronic kidney disease with heart failure and stage 1 through stage 4 chronic kidney disease, or unspecified chronic kidney disease: Secondary | ICD-10-CM | POA: Diagnosis not present

## 2023-11-05 DIAGNOSIS — E1165 Type 2 diabetes mellitus with hyperglycemia: Secondary | ICD-10-CM | POA: Insufficient documentation

## 2023-11-05 DIAGNOSIS — E119 Type 2 diabetes mellitus without complications: Secondary | ICD-10-CM

## 2023-11-05 DIAGNOSIS — Z6841 Body Mass Index (BMI) 40.0 and over, adult: Secondary | ICD-10-CM | POA: Insufficient documentation

## 2023-11-05 DIAGNOSIS — R112 Nausea with vomiting, unspecified: Secondary | ICD-10-CM

## 2023-11-05 DIAGNOSIS — R197 Diarrhea, unspecified: Secondary | ICD-10-CM

## 2023-11-05 DIAGNOSIS — E66813 Obesity, class 3: Secondary | ICD-10-CM | POA: Insufficient documentation

## 2023-11-05 DIAGNOSIS — E860 Dehydration: Secondary | ICD-10-CM

## 2023-11-05 DIAGNOSIS — Z87891 Personal history of nicotine dependence: Secondary | ICD-10-CM | POA: Insufficient documentation

## 2023-11-05 DIAGNOSIS — Z8679 Personal history of other diseases of the circulatory system: Secondary | ICD-10-CM | POA: Insufficient documentation

## 2023-11-05 DIAGNOSIS — J439 Emphysema, unspecified: Secondary | ICD-10-CM

## 2023-11-05 DIAGNOSIS — Z01810 Encounter for preprocedural cardiovascular examination: Secondary | ICD-10-CM | POA: Diagnosis not present

## 2023-11-05 DIAGNOSIS — N189 Chronic kidney disease, unspecified: Secondary | ICD-10-CM

## 2023-11-05 HISTORY — PX: XI ROBOTIC ASSISTED VENTRAL HERNIA: SHX6789

## 2023-11-05 LAB — COMPREHENSIVE METABOLIC PANEL WITH GFR
ALT: 16 U/L (ref 0–44)
AST: 33 U/L (ref 15–41)
Albumin: 3.4 g/dL — ABNORMAL LOW (ref 3.5–5.0)
Alkaline Phosphatase: 74 U/L (ref 38–126)
Anion gap: 15 (ref 5–15)
BUN: 89 mg/dL — ABNORMAL HIGH (ref 8–23)
CO2: 18 mmol/L — ABNORMAL LOW (ref 22–32)
Calcium: 8.8 mg/dL — ABNORMAL LOW (ref 8.9–10.3)
Chloride: 97 mmol/L — ABNORMAL LOW (ref 98–111)
Creatinine, Ser: 2.38 mg/dL — ABNORMAL HIGH (ref 0.44–1.00)
GFR, Estimated: 21 mL/min — ABNORMAL LOW (ref >60)
Glucose, Bld: 186 mg/dL — ABNORMAL HIGH (ref 70–99)
Potassium: 4.9 mmol/L (ref 3.5–5.1)
Sodium: 130 mmol/L — ABNORMAL LOW (ref 135–145)
Total Bilirubin: 1.7 mg/dL — ABNORMAL HIGH (ref 0.0–1.2)
Total Protein: 7.2 g/dL (ref 6.5–8.1)

## 2023-11-05 LAB — CBG MONITORING, ED
Glucose-Capillary: 198 mg/dL — ABNORMAL HIGH (ref 70–99)
Glucose-Capillary: 103 mg/dL — ABNORMAL HIGH (ref 70–99)
Glucose-Capillary: 96 mg/dL (ref 70–99)

## 2023-11-05 LAB — GASTROINTESTINAL PANEL BY PCR, STOOL (REPLACES STOOL CULTURE)

## 2023-11-05 LAB — URINALYSIS, W/ REFLEX TO CULTURE (INFECTION SUSPECTED)
Bilirubin Urine: NEGATIVE
Glucose, UA: >=500 mg/dL — AB
Hgb urine dipstick: NEGATIVE
Ketones, ur: NEGATIVE mg/dL
Nitrite: NEGATIVE
Protein, ur: NEGATIVE mg/dL
Specific Gravity, Urine: 1.015 (ref 1.005–1.030)
WBC, UA: >50 WBC/hpf (ref 0–5)
pH: 5 (ref 5.0–8.0)

## 2023-11-05 LAB — CBC
HCT: 48.2 % — ABNORMAL HIGH (ref 36.0–46.0)
Hemoglobin: 15.3 g/dL — ABNORMAL HIGH (ref 12.0–15.0)
MCH: 26.5 pg (ref 26.0–34.0)
MCHC: 31.7 g/dL (ref 30.0–36.0)
MCV: 83.5 fL (ref 80.0–100.0)
Platelets: 407 K/uL — ABNORMAL HIGH (ref 150–400)
RBC: 5.77 MIL/uL — ABNORMAL HIGH (ref 3.87–5.11)
RDW: 15.8 % — ABNORMAL HIGH (ref 11.5–15.5)
WBC: 12.1 K/uL — ABNORMAL HIGH (ref 4.0–10.5)
nRBC: 0 % (ref 0.0–0.2)

## 2023-11-05 LAB — RESP PANEL BY RT-PCR (RSV, FLU A&B, COVID)  RVPGX2
Influenza A by PCR: NEGATIVE
Influenza B by PCR: NEGATIVE
Resp Syncytial Virus by PCR: NEGATIVE
SARS Coronavirus 2 by RT PCR: NEGATIVE

## 2023-11-05 LAB — LACTIC ACID, PLASMA
Lactic Acid, Venous: 1.4 mmol/L (ref 0.5–1.9)
Lactic Acid, Venous: 1.3 mmol/L (ref 0.5–1.9)

## 2023-11-05 LAB — CK: Total CK: 55 U/L (ref 38–234)

## 2023-11-05 LAB — C DIFFICILE QUICK SCREEN W PCR REFLEX
C Diff antigen: NEGATIVE
C Diff interpretation: NOT DETECTED
C Diff toxin: NEGATIVE

## 2023-11-05 LAB — LIPASE, BLOOD: Lipase: 20 U/L (ref 11–51)

## 2023-11-05 LAB — GLUCOSE, CAPILLARY: Glucose-Capillary: 134 mg/dL — ABNORMAL HIGH (ref 70–99)

## 2023-11-05 SURGERY — REPAIR, HERNIA, VENTRAL, ROBOT-ASSISTED
Anesthesia: General | Site: Abdomen

## 2023-11-05 MED ORDER — SODIUM CHLORIDE 0.9 % IV SOLN
2.0000 g | Freq: Once | INTRAVENOUS | Status: AC
  Administered 2023-11-05: 2 g via INTRAVENOUS
  Filled 2023-11-05: qty 20

## 2023-11-05 MED ORDER — ACETAMINOPHEN 325 MG PO TABS: 650.0000 mg | ORAL_TABLET | Freq: Four times a day (QID) | ORAL | Status: DC | PRN

## 2023-11-05 MED ORDER — ALBUMIN HUMAN 5 % IV SOLN: INTRAVENOUS | Status: DC | PRN

## 2023-11-05 MED ORDER — ACETAMINOPHEN 10 MG/ML IV SOLN
INTRAVENOUS | Status: AC
  Filled 2023-11-05: qty 100

## 2023-11-05 MED ORDER — SEVOFLURANE IN SOLN
RESPIRATORY_TRACT | Status: AC
  Filled 2023-11-05: qty 250

## 2023-11-05 MED ORDER — CEFAZOLIN SODIUM-DEXTROSE 2-3 GM-%(50ML) IV SOLR
INTRAVENOUS | Status: DC | PRN
  Administered 2023-11-05: 2 g via INTRAVENOUS

## 2023-11-05 MED ORDER — ROCURONIUM BROMIDE 100 MG/10ML IV SOLN
INTRAVENOUS | Status: DC | PRN
  Administered 2023-11-05: 70 mg via INTRAVENOUS

## 2023-11-05 MED ORDER — BUPIVACAINE-EPINEPHRINE (PF) 0.25% -1:200000 IJ SOLN
INTRAMUSCULAR | Status: DC | PRN
  Administered 2023-11-05: 30 mL via PERINEURAL

## 2023-11-05 MED ORDER — ONDANSETRON HCL 4 MG/2ML IJ SOLN: 4.0000 mg | Freq: Four times a day (QID) | INTRAMUSCULAR | Status: DC | PRN

## 2023-11-05 MED ORDER — SODIUM CHLORIDE 0.9 % IV SOLN
1.0000 g | INTRAVENOUS | Status: DC
  Administered 2023-11-06 – 2023-11-07 (×2): 1 g via INTRAVENOUS
  Filled 2023-11-05 (×2): qty 10

## 2023-11-05 MED ORDER — ONDANSETRON HCL 4 MG/2ML IJ SOLN
INTRAMUSCULAR | Status: DC | PRN
  Administered 2023-11-05: 4 mg via INTRAVENOUS

## 2023-11-05 MED ORDER — PROPOFOL 10 MG/ML IV BOLUS
INTRAVENOUS | Status: DC | PRN
  Administered 2023-11-05: 80 mg via INTRAVENOUS
  Administered 2023-11-05: 10 mg via INTRAVENOUS

## 2023-11-05 MED ORDER — METOPROLOL TARTRATE 25 MG PO TABS
25.0000 mg | ORAL_TABLET | Freq: Two times a day (BID) | ORAL | Status: DC
  Administered 2023-11-06 – 2023-11-08 (×3): 25 mg via ORAL
  Filled 2023-11-05 (×3): qty 1

## 2023-11-05 MED ORDER — FENTANYL CITRATE (PF) 100 MCG/2ML IJ SOLN
INTRAMUSCULAR | Status: DC | PRN
  Administered 2023-11-05 (×4): 25 ug via INTRAVENOUS

## 2023-11-05 MED ORDER — NOREPINEPHRINE 4 MG/250ML-% IV SOLN
INTRAVENOUS | Status: DC | PRN
  Administered 2023-11-05: 6 ug/min via INTRAVENOUS

## 2023-11-05 MED ORDER — ETOMIDATE 2 MG/ML IV SOLN
INTRAVENOUS | Status: DC | PRN
  Administered 2023-11-05: 10 mg via INTRAVENOUS

## 2023-11-05 MED ORDER — HYDROCODONE-ACETAMINOPHEN 5-325 MG PO TABS
1.0000 | ORAL_TABLET | ORAL | Status: DC | PRN
  Administered 2023-11-06 – 2023-11-07 (×5): 1 via ORAL
  Filled 2023-11-05 (×5): qty 1

## 2023-11-05 MED ORDER — ONDANSETRON HCL 4 MG PO TABS: 4.0000 mg | ORAL_TABLET | Freq: Four times a day (QID) | ORAL | Status: DC | PRN

## 2023-11-05 MED ORDER — DEXAMETHASONE SOD PHOSPHATE PF 10 MG/ML IJ SOLN
INTRAMUSCULAR | Status: DC | PRN
Start: 2023-11-05 — End: 2023-11-05
  Administered 2023-11-05: 5 mg via INTRAVENOUS

## 2023-11-05 MED ORDER — LIDOCAINE HCL (CARDIAC) PF 100 MG/5ML IV SOSY
PREFILLED_SYRINGE | INTRAVENOUS | Status: DC | PRN
  Administered 2023-11-05: 80 mg via INTRAVENOUS

## 2023-11-05 MED ORDER — BUPIVACAINE-EPINEPHRINE (PF) 0.25% -1:200000 IJ SOLN
INTRAMUSCULAR | Status: AC
  Filled 2023-11-05: qty 30

## 2023-11-05 MED ORDER — PROPOFOL 10 MG/ML IV BOLUS
INTRAVENOUS | Status: AC
  Filled 2023-11-05: qty 20

## 2023-11-05 MED ORDER — ROCURONIUM BROMIDE 10 MG/ML (PF) SYRINGE
PREFILLED_SYRINGE | INTRAVENOUS | Status: AC
Start: 2023-11-05 — End: 2023-11-05
  Filled 2023-11-05: qty 10

## 2023-11-05 MED ORDER — ACETAMINOPHEN 10 MG/ML IV SOLN: 1000.0000 mg | Freq: Once | INTRAVENOUS | Status: DC | PRN

## 2023-11-05 MED ORDER — LACTATED RINGERS IV SOLN: INTRAVENOUS | Status: AC

## 2023-11-05 MED ORDER — ACETAMINOPHEN 10 MG/ML IV SOLN
INTRAVENOUS | Status: DC | PRN
  Administered 2023-11-05: 1000 mg via INTRAVENOUS

## 2023-11-05 MED ORDER — OXYCODONE HCL 5 MG PO TABS: 5.0000 mg | ORAL_TABLET | Freq: Once | ORAL | Status: DC | PRN

## 2023-11-05 MED ORDER — 0.9 % SODIUM CHLORIDE (POUR BTL) OPTIME
TOPICAL | Status: DC | PRN
  Administered 2023-11-05: 500 mL

## 2023-11-05 MED ORDER — FENTANYL CITRATE (PF) 100 MCG/2ML IJ SOLN
INTRAMUSCULAR | Status: AC
  Filled 2023-11-05: qty 2

## 2023-11-05 MED ORDER — ALBUMIN HUMAN 5 % IV SOLN
INTRAVENOUS | Status: AC
Start: 2023-11-05 — End: 2023-11-05
  Filled 2023-11-05: qty 250

## 2023-11-05 MED ORDER — LACTATED RINGERS IV BOLUS (SEPSIS)
1000.0000 mL | Freq: Once | INTRAVENOUS | Status: AC
  Administered 2023-11-05: 1000 mL via INTRAVENOUS

## 2023-11-05 MED ORDER — SODIUM CHLORIDE 0.9 % IV BOLUS
1000.0000 mL | Freq: Once | INTRAVENOUS | Status: AC
  Administered 2023-11-05: 1000 mL via INTRAVENOUS

## 2023-11-05 MED ORDER — SUCCINYLCHOLINE CHLORIDE 200 MG/10ML IV SOSY
PREFILLED_SYRINGE | INTRAVENOUS | Status: AC
  Filled 2023-11-05: qty 10

## 2023-11-05 MED ORDER — IPRATROPIUM-ALBUTEROL 0.5-2.5 (3) MG/3ML IN SOLN: 3.0000 mL | Freq: Four times a day (QID) | RESPIRATORY_TRACT | Status: DC | PRN

## 2023-11-05 MED ORDER — INSULIN ASPART 100 UNIT/ML IJ SOLN
0.0000 [IU] | Freq: Three times a day (TID) | INTRAMUSCULAR | Status: DC
  Administered 2023-11-06 (×3): 1 [IU] via SUBCUTANEOUS
  Administered 2023-11-07: 2 [IU] via SUBCUTANEOUS
  Administered 2023-11-07: 4 [IU] via SUBCUTANEOUS
  Administered 2023-11-07 – 2023-11-08 (×2): 2 [IU] via SUBCUTANEOUS
  Filled 2023-11-05 (×7): qty 1

## 2023-11-05 MED ORDER — MORPHINE SULFATE (PF) 4 MG/ML IV SOLN
4.0000 mg | INTRAVENOUS | Status: DC | PRN
  Administered 2023-11-06: 4 mg via INTRAVENOUS
  Filled 2023-11-05: qty 1

## 2023-11-05 MED ORDER — OXYCODONE HCL 5 MG/5ML PO SOLN: 5.0000 mg | Freq: Once | ORAL | Status: DC | PRN

## 2023-11-05 MED ORDER — DROPERIDOL 2.5 MG/ML IJ SOLN: 0.6250 mg | Freq: Once | INTRAMUSCULAR | Status: DC | PRN

## 2023-11-05 MED ORDER — PHENYLEPHRINE 80 MCG/ML (10ML) SYRINGE FOR IV PUSH (FOR BLOOD PRESSURE SUPPORT)
PREFILLED_SYRINGE | INTRAVENOUS | Status: DC | PRN
  Administered 2023-11-05: 160 ug via INTRAVENOUS

## 2023-11-05 MED ORDER — SUGAMMADEX SODIUM 200 MG/2ML IV SOLN
INTRAVENOUS | Status: DC | PRN
  Administered 2023-11-05: 200 mg via INTRAVENOUS

## 2023-11-05 MED ORDER — ROSUVASTATIN CALCIUM 10 MG PO TABS
10.0000 mg | ORAL_TABLET | Freq: Every day | ORAL | Status: DC
  Administered 2023-11-06 – 2023-11-08 (×3): 10 mg via ORAL
  Filled 2023-11-05 (×4): qty 1

## 2023-11-05 MED ORDER — NOREPINEPHRINE 4 MG/250ML-% IV SOLN
INTRAVENOUS | Status: AC
  Filled 2023-11-05: qty 250

## 2023-11-05 MED ORDER — INSULIN ASPART 100 UNIT/ML IJ SOLN
0.0000 [IU] | Freq: Every day | INTRAMUSCULAR | Status: DC
  Administered 2023-11-06: 3 [IU] via SUBCUTANEOUS
  Administered 2023-11-07: 2 [IU] via SUBCUTANEOUS
  Filled 2023-11-05 (×2): qty 1

## 2023-11-05 MED ORDER — ONDANSETRON HCL 4 MG/2ML IJ SOLN
4.0000 mg | Freq: Once | INTRAMUSCULAR | Status: AC
  Administered 2023-11-05: 4 mg via INTRAVENOUS
  Filled 2023-11-05: qty 2

## 2023-11-05 MED ORDER — FENTANYL CITRATE (PF) 100 MCG/2ML IJ SOLN
25.0000 ug | INTRAMUSCULAR | Status: DC | PRN
  Administered 2023-11-05 (×2): 25 ug via INTRAVENOUS

## 2023-11-05 MED ORDER — ACETAMINOPHEN 325 MG PO TABS
650.0000 mg | ORAL_TABLET | Freq: Four times a day (QID) | ORAL | Status: DC | PRN
Start: 2023-11-05 — End: 2023-11-05

## 2023-11-05 SURGICAL SUPPLY — 41 items
BAG PRESSURE INF REUSE 1000 (BAG) IMPLANT
COVER TIP SHEARS 8 DVNC (MISCELLANEOUS) ×1 IMPLANT
COVER WAND RF STERILE (DRAPES) ×1 IMPLANT
DEFOGGER SCOPE WARM SEASHARP (MISCELLANEOUS) ×1 IMPLANT
DERMABOND ADVANCED .7 DNX12 (GAUZE/BANDAGES/DRESSINGS) ×1 IMPLANT
DRAPE ARM DVNC X/XI (DISPOSABLE) ×3 IMPLANT
DRAPE COLUMN DVNC XI (DISPOSABLE) ×1 IMPLANT
ELECTRODE REM PT RTRN 9FT ADLT (ELECTROSURGICAL) ×1 IMPLANT
FORCEPS BPLR FENES DVNC XI (FORCEP) ×1 IMPLANT
GLOVE BIO SURGEON STRL SZ 6.5 (GLOVE) ×2 IMPLANT
GLOVE BIOGEL PI IND STRL 6.5 (GLOVE) ×2 IMPLANT
GLOVE SURG SYN 6.5 PF PI (GLOVE) ×2 IMPLANT
GOWN STRL REUS W/ TWL LRG LVL3 (GOWN DISPOSABLE) ×4 IMPLANT
GRASPER SUT TROCAR 14GX15 (MISCELLANEOUS) IMPLANT
IRRIGATOR SUCT 8 DISP DVNC XI (IRRIGATION / IRRIGATOR) IMPLANT
IV 0.9% NACL 1000 ML (IV SOLUTION) IMPLANT
IV CATH ANGIO 12GX3 LT BLUE (NEEDLE) IMPLANT
KIT PINK PAD W/HEAD ARM REST (MISCELLANEOUS) ×1 IMPLANT
LABEL OR SOLS (LABEL) ×1 IMPLANT
MANIFOLD NEPTUNE II (INSTRUMENTS) ×1 IMPLANT
MESH VENTRLGHT ELLIPSE 8X6XMFL (Mesh Specialty) IMPLANT
NDL DRIVE SUT CUT DVNC (INSTRUMENTS) ×1 IMPLANT
NDL HYPO 22X1.5 SAFETY MO (MISCELLANEOUS) ×1 IMPLANT
NDL INSUFFLATION 14GA 120MM (NEEDLE) ×1 IMPLANT
NEEDLE DRIVE SUT CUT DVNC (INSTRUMENTS) ×1 IMPLANT
NEEDLE HYPO 22X1.5 SAFETY MO (MISCELLANEOUS) ×1 IMPLANT
NEEDLE INSUFFLATION 14GA 120MM (NEEDLE) ×1 IMPLANT
NS IRRIG 500ML POUR BTL (IV SOLUTION) ×1 IMPLANT
OBTURATOR OPTICALSTD 8 DVNC (TROCAR) ×1 IMPLANT
PACK LAP CHOLECYSTECTOMY (MISCELLANEOUS) ×1 IMPLANT
SCISSORS MNPLR CVD DVNC XI (INSTRUMENTS) ×1 IMPLANT
SEAL UNIV 5-12 XI (MISCELLANEOUS) ×3 IMPLANT
SET TUBE SMOKE EVAC HIGH FLOW (TUBING) ×1 IMPLANT
SOLUTION ELECTROSURG ANTI STCK (MISCELLANEOUS) ×1 IMPLANT
SUT STRATA 2-0 30 CT-2 (SUTURE) ×2 IMPLANT
SUT STRATAFIX PDS 30 CT-1 (SUTURE) ×1 IMPLANT
SUT VICRYL 0 UR6 27IN ABS (SUTURE) ×1 IMPLANT
SUTURE MNCRL 4-0 27XMF (SUTURE) ×1 IMPLANT
TAPE TRANSPORE STRL 2 31045 (GAUZE/BANDAGES/DRESSINGS) ×1 IMPLANT
TRAP FLUID SMOKE EVACUATOR (MISCELLANEOUS) ×1 IMPLANT
WATER STERILE IRR 500ML POUR (IV SOLUTION) ×1 IMPLANT

## 2023-11-05 NOTE — Telephone Encounter (Signed)
 Pt has been sick for a week. She has been having diarrhea and is now not eating. Pt's husband is worried about dehydration so is taking her to ED.

## 2023-11-05 NOTE — Assessment & Plan Note (Signed)
Secondary to sepsis with systolic in the 90s Hold antihypertensives and resume when appropriate  

## 2023-11-05 NOTE — Plan of Care (Signed)

## 2023-11-05 NOTE — Assessment & Plan Note (Signed)
 Due to herniation into supraumbilical hernia.  Case discussed with Dr. Rodolph and he will bring to the operating room.

## 2023-11-05 NOTE — Progress Notes (Signed)
 CODE SEPSIS - PHARMACY COMMUNICATION  **Broad Spectrum Antibiotics should be administered within 1 hour of Sepsis diagnosis**  Time Code Sepsis Called/Page Received: 1303  Antibiotics Ordered: Rocephin 2G x1   Time of 1st antibiotic administration: 1351  Additional action taken by pharmacy: Messaged RN to ensure antibiotics were given    Annabella LOISE Banks ,PharmD Clinical Pharmacist  11/05/2023  1:06 PM

## 2023-11-05 NOTE — Transfer of Care (Signed)
 Immediate Anesthesia Transfer of Care Note  Patient: Lorraine Boone  Procedure(s) Performed: REPAIR, HERNIA, VENTRAL, ROBOT-ASSISTED (Abdomen)  Patient Location: PACU  Anesthesia Type:General  Level of Consciousness: awake, alert , and oriented  Airway & Oxygen Therapy: Patient Spontanous Breathing  Post-op Assessment: Report given to RN and Post -op Vital signs reviewed and stable  Post vital signs: Reviewed and stable  Last Vitals:  Vitals Value Taken Time  BP 110/51 11/05/23 19:16  Temp 36.7 C 11/05/23 18:55  Pulse 66 11/05/23 19:27  Resp 19 11/05/23 19:27  SpO2 98 % 11/05/23 19:27  Vitals shown include unfiled device data.  Last Pain:  Vitals:   11/05/23 1924  TempSrc:   PainSc: 6          Complications: No notable events documented.

## 2023-11-05 NOTE — ED Notes (Addendum)
 SABRA

## 2023-11-05 NOTE — Assessment & Plan Note (Signed)
 BMI 41.13

## 2023-11-05 NOTE — Assessment & Plan Note (Signed)
 Stool studies ordered but could be secondary to obstruction

## 2023-11-05 NOTE — Sepsis Progress Note (Signed)
 eLink is following this Code Sepsis.

## 2023-11-05 NOTE — Anesthesia Preprocedure Evaluation (Addendum)
 Anesthesia Evaluation  Patient identified by MRN, date of birth, ID band Patient awake    Reviewed: Allergy & Precautions, H&P , NPO status , Patient's Chart, lab work & pertinent test results  Airway Mallampati: IV  TM Distance: >3 FB Neck ROM: full    Dental  (+) Missing, Chipped   Pulmonary COPD, Patient abstained from smoking., former smoker   Pulmonary exam normal        Cardiovascular Exercise Tolerance: Poor hypertension, Pt. on medications +CHF  Normal cardiovascular exam+ dysrhythmias (Intermittent complete heart block) + pacemaker (PACEMAKER LEADLESS INSERTION 2021) + Cardiac Defibrillator   Micra AV leadless pacemaker implantation 08/27/2019  Pacemaker interrogation 09/02/2023 revealed normal functioning Micra leadless pacemaker with 76.1% AM-VP, longevity of 6.9 years   Neuro/Psych negative neurological ROS  negative psych ROS   GI/Hepatic negative GI ROS, Neg liver ROS,,,  Endo/Other  diabetes, Type 2  Class 3 obesity  Renal/GU Renal InsufficiencyRenal disease     Musculoskeletal  (+) Arthritis ,    Abdominal  (+) + obese  Peds  Hematology   Anesthesia Other Findings Hyponatremia 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  Past Medical History: No date: Arthritis No date: Bradycardia No date: COPD (chronic obstructive pulmonary disease) (HCC) No date: Diabetes mellitus type 2, uncomplicated (HCC) No date: Dyspnea No date: Hyperlipidemia No date: Hypertension No date: Intermittent complete heart block (HCC) 08/18/2018: Iron  deficiency anemia due to chronic blood loss  Past Surgical History: No date: CHOLECYSTECTOMY 08/11/2018: COLONOSCOPY WITH PROPOFOL ; N/A     Comment:  Procedure: COLONOSCOPY WITH PROPOFOL ;  Surgeon: Unk Corinn Skiff, MD;  Location: [facility];  Service:               Gastroenterology;  Laterality: N/A; 08/11/2018:  ESOPHAGOGASTRODUODENOSCOPY (EGD) WITH PROPOFOL ; N/A     Comment:  Procedure: ESOPHAGOGASTRODUODENOSCOPY (EGD) WITH               PROPOFOL ;  Surgeon: Unk Corinn Skiff, MD;  Location:               [facility];  Service: Gastroenterology;  Laterality:               N/A; No date: ESSURE TUBAL LIGATION No date: INSERT / REPLACE / REMOVE PACEMAKER 12/22/2015: PACEMAKER INSERTION; N/A     Comment:  Procedure: INSERTION PACEMAKER;  Surgeon: Marsa Dooms, MD;  Location: [facility];  Service:               Cardiovascular;  Laterality: N/A; 08/27/2019: PACEMAKER LEADLESS INSERTION; N/A     Comment:  Procedure: PACEMAKER LEADLESS INSERTION;  Surgeon:               Dooms Marsa, MD;  Location: [facility]               [facility];  Service: Cardiovascular;  Laterality: N/A; 08/27/2019: TEMPORARY PACEMAKER; N/A     Comment:  Procedure: TEMPORARY PACEMAKER;  Surgeon: Dooms Marsa, MD;  Location: [facility];  Service:              Cardiovascular;  Laterality: N/A;  BMI    Body Mass Index: 41.13 kg/m      Reproductive/Obstetrics negative OB ROS  Anesthesia Physical Anesthesia Plan  ASA: 3  Anesthesia Plan: General ETT   Post-op Pain Management: Ofirmev  IV (intra-op)* and Ketamine IV*   Induction: Intravenous  PONV Risk Score and Plan: 2 and Ondansetron  and Dexamethasone  Airway Management Planned: Oral ETT  Additional Equipment:   Intra-op Plan:   Post-operative Plan: Extubation in OR  Informed Consent: I have reviewed the patients History and Physical, chart, labs and discussed the procedure including the risks, benefits and alternatives for the proposed anesthesia with the patient or authorized representative who has indicated his/her understanding and acceptance.     Dental Advisory Given  Plan Discussed with: CRNA and Surgeon  Anesthesia Plan Comments:  ([employer] Device Rep was consulted. He reports that leadless pacemakers have a very low chance of misinterpreting electrocautery. He advises to minimize cautery if even needed. He will not be able to reprogram today. )         Anesthesia Quick Evaluation

## 2023-11-05 NOTE — ED Provider Notes (Signed)
 [facility] Provider Note    Event Date/Time   First MD Initiated Contact with Patient 11/05/23 1016     (approximate)   History   Diarrhea   HPI  Lorraine Boone is a 73 y.o. female past medical history significant for diabetes, hypertension, hyperlipidemia, CHF, COPD, who presents to the emergency department with abdominal pain, diarrhea and not feeling well.  States that she has not been feeling well for the past week.  Complaining of diarrhea, abdominal pain and an episode of vomiting this morning.  Multiple prior abdominal surgeries.  Prior cholecystectomy.  No prior history of small bowel obstruction.  No recent antibiotic use.  No falls or trauma.  States that she recently changed her insulin  but no other new medication changes.  Does endorse dysuria with urinary urgency and frequency.     Physical Exam   Triage Vital Signs: ED Triage Vitals  Encounter Vitals Group     BP 11/05/23 1009 (!) 100/40     Girls Systolic BP Percentile --      Girls Diastolic BP Percentile --      Boys Systolic BP Percentile --      Boys Diastolic BP Percentile --      Pulse Rate 11/05/23 1009 (!) 57     Resp 11/05/23 1009 18     Temp 11/05/23 1010 97.6 F (36.4 C)     Temp Source 11/05/23 1010 Oral     SpO2 11/05/23 1009 96 %     Weight 11/05/23 1049 224 lb 13.9 oz (102 kg)     Height 11/05/23 1049 5' 2 (1.575 m)     Head Circumference --      Peak Flow --      Pain Score 11/05/23 1009 2     Pain Loc --      Pain Education --      Exclude from Growth Chart --     Most recent vital signs: Vitals:   11/05/23 1009 11/05/23 1010  BP: (!) 100/40   Pulse: (!) 57   Resp: 18   Temp:  97.6 F (36.4 C)  SpO2: 96%     Physical Exam Constitutional:      Appearance: She is well-developed.  HENT:     Head: Atraumatic.  Eyes:     Conjunctiva/sclera: Conjunctivae normal.  Cardiovascular:     Rate and Rhythm: Regular rhythm.  Pulmonary:     Effort: No  respiratory distress.  Abdominal:     General: There is no distension.     Tenderness: There is abdominal tenderness (Abdominal distention with tenderness to palpation).  Musculoskeletal:        General: Normal range of motion.     Cervical back: Normal range of motion.  Skin:    General: Skin is warm.     Capillary Refill: Capillary refill takes less than 2 seconds.  Neurological:     Mental Status: She is alert. Mental status is at baseline.     IMPRESSION / MDM / ASSESSMENT AND PLAN / ED COURSE  I reviewed the triage vital signs and the nursing notes.  Differential diagnosis including viral gastroenteritis, infectious diarrhea, C. difficile, small bowel obstruction, intra-abdominal abscess, urinary tract infection, pyelonephritis   No tachycardic or bradycardic dysrhythmias while on cardiac telemetry.  RADIOLOGY CT scan abdomen and pelvis currently pending LABS (all labs ordered are listed, but only abnormal results are displayed) Labs interpreted as -    Labs Reviewed  COMPREHENSIVE  METABOLIC PANEL WITH GFR - Abnormal; Notable for the following components:      Result Value   Sodium 130 (*)    Chloride 97 (*)    CO2 18 (*)    Glucose, Bld 186 (*)    BUN 89 (*)    Creatinine, Ser 2.38 (*)    Calcium  8.8 (*)    Albumin 3.4 (*)    Total Bilirubin 1.7 (*)    GFR, Estimated 21 (*)    All other components within normal limits  CBC - Abnormal; Notable for the following components:   WBC 12.1 (*)    RBC 5.77 (*)    Hemoglobin 15.3 (*)    HCT 48.2 (*)    RDW 15.8 (*)    Platelets 407 (*)    All other components within normal limits  URINALYSIS, W/ REFLEX TO CULTURE (INFECTION SUSPECTED) - Abnormal; Notable for the following components:   Color, Urine AMBER (*)    APPearance CLOUDY (*)    Glucose, UA >=500 (*)    Leukocytes,Ua LARGE (*)    Bacteria, UA MANY (*)    All other components within normal limits  CBG MONITORING, ED - Abnormal; Notable for the following  components:   Glucose-Capillary 198 (*)    All other components within normal limits  RESP PANEL BY RT-PCR (RSV, FLU A&B, COVID)  RVPGX2  GASTROINTESTINAL PANEL BY PCR, STOOL (REPLACES STOOL CULTURE)  C DIFFICILE QUICK SCREEN W PCR REFLEX    CULTURE, BLOOD (ROUTINE X 2)  CULTURE, BLOOD (ROUTINE X 2)  URINE CULTURE  LIPASE, BLOOD  CK  LACTIC ACID, PLASMA  LACTIC ACID, PLASMA     MDM  Lab work with leukocytosis of 12.  Hemoglobin is elevated at 15.  Acute kidney injury with a creatinine elevated up to 2.38 from a baseline of 1.5, elevated BUN to 90 and appears prerenal.  CO2 is low at 18.  Hyponatremia 130.  No signs of an anion gap.  Does have a mildly elevated T. bili at 1.7.  Lipase normal.  Patient given IV fluids and antiemetics.  Patient with findings concerning for acute kidney injury.  Also had findings of urinary tract infection.  Adding on blood cultures and a lactic acid.  Felt that 30 cc/kg of IV fluids may be detrimental to the patient.  Given 1 L IV fluid bolus and will reevaluate.  Started on antibiotics to cover for urinary tract infection.  Consulted hospitalist for admission for acute kidney injury, dehydration and urinary tract infection.  CT scan of the abdomen and pelvis was in process and had not yet been read.  Stated that they would follow-up on CT scan.  After admission CT scan resulted with concern for bowel obstruction.  Reached back out to hospitalist team, they have consulted general surgery for small bowel obstruction with concern for herniation into the supraumbilical hernia.     PROCEDURES:  Critical Care performed: yes  .Critical Care  Performed by: Suzanne Kirsch, MD Authorized by: Suzanne Kirsch, MD   Critical care provider statement:    Critical care time (minutes):  30   Critical care time was exclusive of:  Separately billable procedures and treating other patients   Critical care was necessary to treat or prevent imminent or  life-threatening deterioration of the following conditions:  Sepsis   Critical care was time spent personally by me on the following activities:  Development of treatment plan with patient or surrogate, discussions with consultants, evaluation of patient's  response to treatment, examination of patient, ordering and review of laboratory studies, ordering and review of radiographic studies, ordering and performing treatments and interventions, pulse oximetry, re-evaluation of patient's condition and review of old charts   Patient's presentation is most consistent with acute presentation with potential threat to life or bodily function.   MEDICATIONS ORDERED IN ED: Medications  lactated ringers infusion ( Intravenous New Bag/Given 11/05/23 1448)  acetaminophen  (TYLENOL ) tablet 650 mg (has no administration in time range)  metoprolol  tartrate (LOPRESSOR ) tablet 25 mg (has no administration in time range)  rosuvastatin  (CRESTOR ) tablet 10 mg (has no administration in time range)  ipratropium-albuterol  (DUONEB) 0.5-2.5 (3) MG/3ML nebulizer solution 3 mL (has no administration in time range)  insulin  aspart (novoLOG ) injection 0-6 Units (has no administration in time range)  insulin  aspart (novoLOG ) injection 0-5 Units (has no administration in time range)  ondansetron  (ZOFRAN ) tablet 4 mg (has no administration in time range)    Or  ondansetron  (ZOFRAN ) injection 4 mg (has no administration in time range)  cefTRIAXone (ROCEPHIN) 1 g in sodium chloride  0.9 % 100 mL IVPB (has no administration in time range)  sodium chloride  0.9 % bolus 1,000 mL (0 mLs Intravenous Stopped 11/05/23 1351)  ondansetron  (ZOFRAN ) injection 4 mg (4 mg Intravenous Given 11/05/23 1117)  lactated ringers bolus 1,000 mL (0 mLs Intravenous Stopped 11/05/23 1451)  cefTRIAXone (ROCEPHIN) 2 g in sodium chloride  0.9 % 100 mL IVPB (0 g Intravenous Stopped 11/05/23 1451)    FINAL CLINICAL IMPRESSION(S) / ED DIAGNOSES   Final  diagnoses:  AKI (acute kidney injury)  Diarrhea, unspecified type  Dehydration  Acute cystitis without hematuria     Rx / DC Orders   ED Discharge Orders     None        Note:  This document was prepared using Dragon voice recognition software and may include unintentional dictation errors.   Suzanne Kirsch, MD 11/05/23 1511

## 2023-11-05 NOTE — Anesthesia Procedure Notes (Addendum)
 Procedure Name: Intubation Date/Time: 11/05/2023 4:39 PM  Performed by: Dyane Mass, CRNAPre-anesthesia Checklist: Patient identified, Emergency Drugs available, Suction available and Patient being monitored Patient Re-evaluated:Patient Re-evaluated prior to induction Oxygen Delivery Method: Circle system utilized Preoxygenation: Pre-oxygenation with 100% oxygen Induction Type: IV induction and Rapid sequence Ventilation: Mask ventilation without difficulty Laryngoscope Size: McGrath and 3 Grade View: Grade I Tube type: Oral Tube size: 7.0 mm Number of attempts: 1 Airway Equipment and Method: Stylet Placement Confirmation: ETT inserted through vocal cords under direct vision, positive ETCO2 and breath sounds checked- equal and bilateral Secured at: 20 cm Tube secured with: Tape Dental Injury: Teeth and Oropharynx as per pre-operative assessment

## 2023-11-05 NOTE — Assessment & Plan Note (Signed)
Respiratory status stable. 

## 2023-11-05 NOTE — Assessment & Plan Note (Signed)
 On Rocephin

## 2023-11-05 NOTE — Anesthesia Postprocedure Evaluation (Signed)
 Anesthesia Post Note  Patient: Lorraine Boone  Procedure(s) Performed: REPAIR, HERNIA, VENTRAL, ROBOT-ASSISTED (Abdomen)  Patient location during evaluation: PACU Anesthesia Type: General Level of consciousness: awake and alert Pain management: pain level controlled Vital Signs Assessment: post-procedure vital signs reviewed and stable Respiratory status: spontaneous breathing, nonlabored ventilation and respiratory function stable Cardiovascular status: blood pressure returned to baseline and stable Postop Assessment: no apparent nausea or vomiting Anesthetic complications: no   No notable events documented.   Last Vitals:  Vitals:   11/05/23 2017 11/05/23 2045  BP: 103/88 (!) 131/49  Pulse: 69 68  Resp: 16 18  Temp: 36.6 C 36.9 C  SpO2: 95% 97%    Last Pain:  Vitals:   11/05/23 2045  TempSrc: Oral  PainSc:                  [name]

## 2023-11-05 NOTE — Assessment & Plan Note (Signed)
 Has pacemaker

## 2023-11-05 NOTE — Op Note (Signed)
 Preoperative diagnosis: Multiple incisional Hernias with large bowel obstruction  Postoperative diagnosis: Same  Procedure: Robotic assisted laparoscopic incisional hernia repair with mesh  Anesthesia: General  Surgeon: Dr. Rodolph  Wound Classification: Clean  Specimen: None  Complications: None  Estimated Blood Loss: 10ml  Indications: Patient is a 73 y.o. female developed a ventral hernia. This was symptomatic and incarcerated and repair was indicated.   Findings: Swiss cheese multiple incisional hernias for a total of 11 x 4 cm defects 2. Repair achieved with closure of the anterior fascia at midline and 20 x 15 cm Ventralight mesh 3. Adequate hemostasis             Description of procedure: The patient was brought to the operating room and general anesthesia was induced. A time-out was completed verifying correct patient, procedure, site, positioning, and implant(s) and/or special equipment prior to beginning this procedure. Antibiotics were administered prior to making the incision. SCDs placed. The anterior abdominal wall was prepped and draped in the standard sterile fashion.   Palmer's point chosen for entry.  Veress needle placed and abdomen insufflated to 15 mmHg without any dramatic increase in pressure.  Needle removed and optiview technique used to place 8 mm port at same point. This was exchanged to a 12 mm trocar and two additional 8 mm trocars were inserted in the left lateral abdomen.  No injury noted during placement. Xi robot then docked into place.  A very difficult and time consuming lysis of adhesions was needed to be done to be able to get into the hernia defects. The most cephalad defect had incarcerated large intestine causing obstruction.Viable intestine identified and reduced.   Insufflation dropped to 8 mmHg and transfacial suture with 0 stratafix used to primarily close defect under minimal tension. Bard Ventraligh protected 20 x 15 cm mesh  was placed within the abdominal cavity and secured to the abdominal wall centered over the defect using the 0 stratafix previously used to primarily close defect.  The mesh was then circumferentially sutured into the anterior abdominal wall using 2-0 Stratafix.  Any bleeding noted during this portion was no longer actively bleeding by end of securing mesh and tightening the suture.    Robot was undocked.  The 12mm cannula was removed and port site was closed using PMI device and 0 vicryl suture, ensuring no bowels were injured during this process.  Abdomen then desufflated while camera within abdomen to ensure no signs of new bleed prior to removing camera and rest of ports completely.  All skin incisions closed with runninrg 4-0 Monocryl in a subcuticular fashion.  All wounds then dressed with Dermabond.  Patient was then successfully awakened and transferred to PACU in stable condition.  At the end of the procedure sponge and instrument counts were correct.

## 2023-11-05 NOTE — ED Notes (Signed)
 Pt assisted to bathroom at this time to collect stool sample. Pt was able to go but urinated in specimen. This RN was unable to collect sample at this time.

## 2023-11-05 NOTE — Consult Note (Addendum)
 [facility]-General Surgery  SURGICAL CONSULTATION NOTE    HISTORY OF PRESENT ILLNESS (HPI):  73 y.o. female with a past medical history of diabetes type 2 insulin -dependent, essential hypertension, hyperlipidemia, acute diastolic CHF, pacemaker, COPD, and obesity, presented to [facility] ED today for evaluation of abdominal pain with nausea and vomiting. Patient says symptoms have been going on for about 1 week.  The abdominal pain is focal to mid abdomen, non-radiating. States that she would feel a bulge, would push it back and have a bowel movement it. This would occur intermittently.  No known alleviating or aggravating factors.  Denies any other symptoms.  Past surgical history includes cholecystectomy and tubal ligation.  In the ED, patient had a temp of 97.6 F, BP of 100/40, HR 57, and RR of 18.  Labs indicated leukocytosis 12.1 and Hgb of 15.3.  Elevated creatinine 2.38. Normal AST, ALT, alkaline phosphatase with an elevated total bili of 1.7. Normal lipase of 20. CT of abdomen pelvis showed an obstructed transverse colon due to herniation of supraumbilical hernia measuring up to 5.9 cm.  Surgery is consulted by Dr. Suzanne in this context for evaluation and management of large bowel obstruction due to incisional hernia.  PAST MEDICAL HISTORY (PMH):  Past Medical History:  Diagnosis Date   Arthritis    Bradycardia    COPD (chronic obstructive pulmonary disease) (HCC)    Diabetes mellitus type 2, uncomplicated (HCC)    Dyspnea    Hyperlipidemia    Hypertension    Intermittent complete heart block (HCC)    Iron  deficiency anemia due to chronic blood loss 08/18/2018     PAST SURGICAL HISTORY (PSH):  Past Surgical History:  Procedure Laterality Date   CHOLECYSTECTOMY     COLONOSCOPY WITH PROPOFOL  N/A 08/11/2018   Procedure: COLONOSCOPY WITH PROPOFOL ;  Surgeon: Unk Corinn Skiff, MD;  Location: [facility] ENDOSCOPY;  Service: Gastroenterology;  Laterality: N/A;   ESOPHAGOGASTRODUODENOSCOPY  (EGD) WITH PROPOFOL  N/A 08/11/2018   Procedure: ESOPHAGOGASTRODUODENOSCOPY (EGD) WITH PROPOFOL ;  Surgeon: Unk Corinn Skiff, MD;  Location: [facility] ENDOSCOPY;  Service: Gastroenterology;  Laterality: N/A;   ESSURE TUBAL LIGATION     INSERT / REPLACE / REMOVE PACEMAKER     PACEMAKER INSERTION N/A 12/22/2015   Procedure: INSERTION PACEMAKER;  Surgeon: Marsa Dooms, MD;  Location: [facility] ORS;  Service: Cardiovascular;  Laterality: N/A;   PACEMAKER LEADLESS INSERTION N/A 08/27/2019   Procedure: PACEMAKER LEADLESS INSERTION;  Surgeon: Dooms Marsa, MD;  Location: [facility] INVASIVE CV LAB;  Service: Cardiovascular;  Laterality: N/A;   TEMPORARY PACEMAKER N/A 08/27/2019   Procedure: TEMPORARY PACEMAKER;  Surgeon: Dooms Marsa, MD;  Location: [facility] INVASIVE CV LAB;  Service: Cardiovascular;  Laterality: N/A;     MEDICATIONS:  Prior to Admission medications   Medication Sig Start Date End Date Taking? Authorizing Provider  acetaminophen  (TYLENOL ) 325 MG tablet Take 650 mg by mouth every 6 (six) hours as needed for moderate pain.     [provider]  cholecalciferol (VITAMIN D3) 25 MCG (1000 UNIT) tablet Take 1,000 Units by mouth daily.    [provider]  cyanocobalamin  (VITAMIN B12) 1000 MCG/ML injection Inject 1 mL (1,000 mcg total) into the skin every 30 (thirty) days. 08/16/23   Fernand Fredy RAMAN, MD  ferrous sulfate  325 (65 FE) MG EC tablet Take 1 tablet (325 mg total) by mouth 3 (three) times daily with meals. 08/14/18   Babara Call, MD  glipiZIDE  (GLUCOTROL  XL) 5 MG 24 hr tablet TAKE 1 TABLET BY MOUTH DAILY 08/16/23  Fernand Fredy RAMAN, MD  hydrochlorothiazide (HYDRODIURIL) 25 MG tablet Take 25 mg by mouth daily.    [provider]  insulin  isophane & regular human KwikPen (NOVOLIN  70/30 KWIKPEN) (70-30) 100 UNIT/ML KwikPen Inject 30 units in morning, inject 25 units at bedtime 10/03/23   Fernand Fredy RAMAN, MD  Insulin  Syringes, Disposable, U-100 1 ML MISC Inject 1 each into  the skin once a week. 08/16/23   Fernand Fredy RAMAN, MD  ipratropium-albuterol  (DUONEB) 0.5-2.5 (3) MG/3ML SOLN Take 3 mLs by nebulization every 6 (six) hours as needed. 12/24/15   Tobie Press, MD  JARDIANCE 25 MG TABS tablet Take 1 tablet (25 mg total) by mouth daily. 10/28/23   Fernand Fredy RAMAN, MD  losartan  (COZAAR ) 100 MG tablet Take 100 mg by mouth daily.    [provider]  metoprolol  tartrate (LOPRESSOR ) 50 MG tablet Take 1 tablet (50 mg total) by mouth 2 (two) times daily. 08/15/23   Fernand Fredy RAMAN, MD  [facility] ULTRA test strip 1 each 2 (two) times daily. 09/12/23   [provider]  rosuvastatin  (CRESTOR ) 10 MG tablet Take 10 mg by mouth daily.    [provider]  Syringe, Disposable, (B-D SYRINGE SLIP TIP 1CC) 1 ML MISC 1 mL by Does not apply route once a week. 09/26/23   Fernand Fredy RAMAN, MD     ALLERGIES:  No Known Allergies   SOCIAL HISTORY:  Social History   Socioeconomic History   Marital status: Married    Spouse name: Not on file   Number of children: Not on file   Years of education: Not on file   Highest education level: Not on file  Occupational History   Not on file  Tobacco Use   Smoking status: Former    Current packs/day: 0.00    Average packs/day: 0.3 packs/day for 35.0 years (8.8 ttl pk-yrs)    Types: Cigarettes    Start date: 12/20/1980    Quit date: 12/21/2015    Years since quitting: 7.8   Smokeless tobacco: Never  Substance and Sexual Activity   Alcohol use: No   Drug use: No   Sexual activity: Not on file  Other Topics Concern   Not on file  Social History Narrative   Not on file   Social Drivers of Health   Financial Resource Strain: Not on file  Food Insecurity: Not on file  Transportation Needs: Not on file  Physical Activity: Not on file  Stress: Not on file  Social Connections: Not on file  Intimate Partner Violence: Not on file     FAMILY HISTORY:  Family History  Problem Relation Age of Onset   Diabetes  Mother    Heart disease Mother    Lung cancer Father       REVIEW OF SYSTEMS:  Review of Systems  Constitutional:  Negative for chills and fever.  Respiratory:  Negative for shortness of breath and wheezing.   Cardiovascular:  Negative for chest pain and palpitations.  Gastrointestinal:  Positive for abdominal pain, diarrhea, nausea and vomiting.    VITAL SIGNS:  Temp:  [97.6 F (36.4 C)] 97.6 F (36.4 C) (10/14 1010) Pulse Rate:  [57] 57 (10/14 1009) Resp:  [18] 18 (10/14 1009) BP: (100)/(40) 100/40 (10/14 1009) SpO2:  [96 %] 96 % (10/14 1009) Weight:  [102 kg] 102 kg (10/14 1049)     Height: 5' 2 (157.5 cm) Weight: 102 kg BMI (Calculated): 41.12   INTAKE/OUTPUT:  No intake/output data  recorded.  PHYSICAL EXAM:  Physical Exam Constitutional:      Appearance: Normal appearance. She is obese.  HENT:     Head: Normocephalic and atraumatic.  Cardiovascular:     Rate and Rhythm: Regular rhythm. Bradycardia present.  Pulmonary:     Effort: Pulmonary effort is normal.     Breath sounds: Normal breath sounds.  Abdominal:     Palpations: Abdomen is soft.     Tenderness: There is abdominal tenderness. There is no guarding.     Hernia: No hernia (No hernia felt on exam.) is present.  Neurological:     Mental Status: She is alert.     Labs:     Latest Ref Rng & Units 11/05/2023   10:30 AM 08/15/2023   10:10 AM 08/27/2019    8:05 AM  CBC  WBC 4.0 - 10.5 K/uL 12.1  10.0  10.9   Hemoglobin 12.0 - 15.0 g/dL 84.6  85.0  87.3   Hematocrit 36.0 - 46.0 % 48.2  49.8  38.8   Platelets 150 - 400 K/uL 407  279  238       Latest Ref Rng & Units 11/05/2023   10:30 AM 09/26/2023   11:35 AM 08/28/2019    4:19 AM  CMP  Glucose 70 - 99 mg/dL 813  95  858   BUN 8 - 23 mg/dL 89  28  26   Creatinine 0.44 - 1.00 mg/dL 7.61  8.55  8.46   Sodium 135 - 145 mmol/L 130  143  142   Potassium 3.5 - 5.1 mmol/L 4.9  4.6  3.6   Chloride 98 - 111 mmol/L 97  104  103   CO2 22 - 32 mmol/L 18  23   30    Calcium  8.9 - 10.3 mg/dL 8.8  9.8  8.8   Total Protein 6.5 - 8.1 g/dL 7.2  7.3    Total Bilirubin 0.0 - 1.2 mg/dL 1.7  0.5    Alkaline Phos 38 - 126 U/L 74  92    AST 15 - 41 U/L 33  19    ALT 0 - 44 U/L 16  11      Imaging studies:  EXAM: CT ABDOMEN AND PELVIS WITHOUT CONTRAST 11/05/2023 12:58:36 PM   TECHNIQUE: CT of the abdomen and pelvis was performed without the administration of intravenous contrast. Multiplanar reformatted images are provided for review. Automated exposure control, iterative reconstruction, and/or weight-based adjustment of the mA/kV was utilized to reduce the radiation dose to as low as reasonably achievable.   COMPARISON: None available.   CLINICAL HISTORY: Abdominal pain, acute, nonlocalized. Pt to ED via POV from home. Pt reports diarrhea, abd pain and decreased appetite x6 days. Pt reports vomiting once this am.   FINDINGS:   LOWER CHEST: No acute abnormality.   LIVER: The liver is unremarkable.   GALLBLADDER AND BILE DUCTS: Cholecystectomy. No biliary ductal dilatation.   SPLEEN: No acute abnormality.   PANCREAS: No acute abnormality.   ADRENAL GLANDS: No acute abnormality.   KIDNEYS, URETERS AND BLADDER: No stones in the kidneys or ureters. No hydronephrosis. No perinephric or periureteral stranding. Urinary bladder is unremarkable.   GI AND BOWEL: Supraumbilical hernia containing obstructed transverse colon. The transverse colon is obstructed as it exits the hernia sac (series 2 image 33). The colon upstream from the herniation is dilated measuring up to 5.9 cm. The ileum has also dilated, measuring up to 3.3 cm. No evidence of ischemia on noncontrast exam.  PERITONEUM AND RETROPERITONEUM: No ascites. No free air.   VASCULATURE: Aorta is normal in caliber. Advanced aortic atherosclerotic calcification.   LYMPH NODES: No lymphadenopathy.   REPRODUCTIVE ORGANS: No acute abnormality.   BONES AND SOFT  TISSUES: No acute osseous abnormality. There are 3 additional small fat-containing central abdominal wall hernias in the midline.   IMPRESSION: 1. 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   Electronically signed by: Norman Gatlin MD 11/05/2023 01:41 PM EDT RP Workstation: [id number]   Assessment/Plan: 73 y.o. female with large bowel obstruction due to incisional hernia complicated by pertinent comorbidities including diabetes type 2 insulin -dependent, essential hypertension, hyperlipidemia, acute diastolic CHF, pacemaker, COPD, and obesity.   - Appears overall stable on exam with mild abdominal discomfort. Incisional hernia was not present on exam.  Due to the reoccurring nature and acute obstruction, discussed incisional hernia repair with patient.  This will prevent the risk of further complication including incarceration of strangulation of the hernia leading to ischemic bowel. Patient agrees to proceed with surgery. All questions and concerns were answered.  - Discussed robotic assisted laparoscopic incisional hernia repair.  Will schedule this afternoon.  - Continue NPO for surgery   - Continue pain management   Thank you for the opportunity to participate in this patient's care.   -- Gilmer Cea PA-C

## 2023-11-05 NOTE — ED Triage Notes (Signed)
 Pt to ED via POV from home. Pt reports diarrhea, abd pain and decreased appetite x6 days. Pt reports vomiting once this am.

## 2023-11-05 NOTE — Assessment & Plan Note (Addendum)
 AKI on CKD stage IIIb.  IV fluid hydration.  Hold losartan  and hydrochlorothiazide

## 2023-11-05 NOTE — H&P (Addendum)
 History and Physical    Patient: Lorraine Boone FMW:[id number] DOB: 07/16/1950 DOA: 11/05/2023 DOS: the patient was seen and examined on 11/05/2023 PCP: Fernand Fredy RAMAN, MD  Patient coming from: Home  Chief Complaint:  Chief Complaint  Patient presents with   Diarrhea   HPI: Lorraine Boone is a 73 y.o. female with medical history significant of diastolic  congestive heart failure, COPD, pacemaker placement, hypertension and hyperlipidemia.  She presents to the hospital with nausea vomiting diarrhea going on for 1 week.  No travel out of country, no sick contacts, no recent antibiotics.  No blood in vomit or diarrhea. Having abdominal pain also.  Center of abdomen, sharpe in nature, 5/10 in intensity. Unable to keep anything down.  Hospital services contacted for further evaluation.  CT scan showing obstruction of transverse colon due to herniation and anterior supraumbilical hernia.  I called Dr. Rodolph to see patient and he will take to the operating room tonight. Review of Systems: Review of Systems  Constitutional:  Positive for diaphoresis. Negative for chills and fever.  HENT:  Negative for hearing loss.   Eyes:  Positive for blurred vision.  Respiratory:  Negative for cough and shortness of breath.   Cardiovascular:  Negative for chest pain.  Gastrointestinal:  Positive for abdominal pain, diarrhea, nausea and vomiting. Negative for blood in stool and constipation.  Genitourinary:  Positive for dysuria.  Musculoskeletal:  Positive for myalgias.  Skin:  Negative for rash.  Neurological:  Negative for dizziness.  Endo/Heme/Allergies:  Does not bruise/bleed easily.  Psychiatric/Behavioral:  Negative for depression.     Past Medical History:  Diagnosis Date   Arthritis    Bradycardia    COPD (chronic obstructive pulmonary disease) (HCC)    Diabetes mellitus type 2, uncomplicated (HCC)    Dyspnea    Hyperlipidemia    Hypertension    Intermittent complete heart block (HCC)     Iron  deficiency anemia due to chronic blood loss 08/18/2018   Past Surgical History:  Procedure Laterality Date   CHOLECYSTECTOMY     COLONOSCOPY WITH PROPOFOL  N/A 08/11/2018   Procedure: COLONOSCOPY WITH PROPOFOL ;  Surgeon: Unk Corinn Skiff, MD;  Location: [facility] ENDOSCOPY;  Service: Gastroenterology;  Laterality: N/A;   ESOPHAGOGASTRODUODENOSCOPY (EGD) WITH PROPOFOL  N/A 08/11/2018   Procedure: ESOPHAGOGASTRODUODENOSCOPY (EGD) WITH PROPOFOL ;  Surgeon: Unk Corinn Skiff, MD;  Location: [facility] ENDOSCOPY;  Service: Gastroenterology;  Laterality: N/A;   ESSURE TUBAL LIGATION     INSERT / REPLACE / REMOVE PACEMAKER     PACEMAKER INSERTION N/A 12/22/2015   Procedure: INSERTION PACEMAKER;  Surgeon: Marsa Dooms, MD;  Location: [facility] ORS;  Service: Cardiovascular;  Laterality: N/A;   PACEMAKER LEADLESS INSERTION N/A 08/27/2019   Procedure: PACEMAKER LEADLESS INSERTION;  Surgeon: Dooms Marsa, MD;  Location: [facility] INVASIVE CV LAB;  Service: Cardiovascular;  Laterality: N/A;   TEMPORARY PACEMAKER N/A 08/27/2019   Procedure: TEMPORARY PACEMAKER;  Surgeon: Dooms Marsa, MD;  Location: [facility] INVASIVE CV LAB;  Service: Cardiovascular;  Laterality: N/A;   Social History:  reports that she quit smoking about 7 years ago. Her smoking use included cigarettes. She started smoking about 42 years ago. She has a 8.8 pack-year smoking history. She has never used smokeless tobacco. She reports that she does not drink alcohol and does not use drugs.  No Known Allergies  Family History  Problem Relation Age of Onset   Diabetes Mother    Heart disease Mother    Lung cancer Father     Prior to Admission  medications   Medication Sig Start Date End Date Taking? Authorizing Provider  acetaminophen  (TYLENOL ) 325 MG tablet Take 650 mg by mouth every 6 (six) hours as needed for moderate pain.     [provider]  cholecalciferol (VITAMIN D3) 25 MCG (1000 UNIT) tablet Take 1,000 Units by mouth  daily.    [provider]  cyanocobalamin  (VITAMIN B12) 1000 MCG/ML injection Inject 1 mL (1,000 mcg total) into the skin every 30 (thirty) days. 08/16/23   Fernand Fredy RAMAN, MD  ferrous sulfate  325 (65 FE) MG EC tablet Take 1 tablet (325 mg total) by mouth 3 (three) times daily with meals. 08/14/18   Babara Call, MD  glipiZIDE  (GLUCOTROL  XL) 5 MG 24 hr tablet TAKE 1 TABLET BY MOUTH DAILY 08/16/23   Khan, Neelam S, MD  hydrochlorothiazide (HYDRODIURIL) 25 MG tablet Take 25 mg by mouth daily.    [provider]  insulin  isophane & regular human KwikPen (NOVOLIN  70/30 KWIKPEN) (70-30) 100 UNIT/ML KwikPen Inject 30 units in morning, inject 25 units at bedtime 10/03/23   Fernand Fredy RAMAN, MD  Insulin  Syringes, Disposable, U-100 1 ML MISC Inject 1 each into the skin once a week. 08/16/23   Fernand Fredy RAMAN, MD  ipratropium-albuterol  (DUONEB) 0.5-2.5 (3) MG/3ML SOLN Take 3 mLs by nebulization every 6 (six) hours as needed. 12/24/15   Tobie Press, MD  JARDIANCE 25 MG TABS tablet Take 1 tablet (25 mg total) by mouth daily. 10/28/23   Fernand Fredy RAMAN, MD  losartan  (COZAAR ) 100 MG tablet Take 100 mg by mouth daily.    [provider]  metoprolol  tartrate (LOPRESSOR ) 50 MG tablet Take 1 tablet (50 mg total) by mouth 2 (two) times daily. 08/15/23   Fernand Fredy RAMAN, MD  [facility] ULTRA test strip 1 each 2 (two) times daily. 09/12/23   [provider]  rosuvastatin  (CRESTOR ) 10 MG tablet Take 10 mg by mouth daily.    [provider]  Syringe, Disposable, (B-D SYRINGE SLIP TIP 1CC) 1 ML MISC 1 mL by Does not apply route once a week. 09/26/23   Fernand Fredy RAMAN, MD    Physical Exam: Vitals:   11/05/23 1009 11/05/23 1010 11/05/23 1049  BP: (!) 100/40    Pulse: (!) 57    Resp: 18    Temp:  97.6 F (36.4 C)   TempSrc:  Oral   SpO2: 96%    Weight:   102 kg  Height:   5' 2 (1.575 m)   Physical Exam HENT:     Head: Normocephalic.  Eyes:     General: Lids are normal.   Cardiovascular:     Rate and Rhythm: Normal rate and regular rhythm.     Heart sounds: Normal heart sounds, S1 normal and S2 normal.  Pulmonary:     Breath sounds: No decreased breath sounds, wheezing, rhonchi or rales.  Abdominal:     Palpations: Abdomen is soft.     Tenderness: There is abdominal tenderness in the epigastric area and periumbilical area.  Musculoskeletal:     Right lower leg: No swelling.     Left lower leg: No swelling.  Skin:    General: Skin is warm.     Findings: No rash.  Neurological:     Mental Status: She is alert and oriented to person, place, and time.     Data Reviewed: Ordered stat EKG pre-op CT scan showing obstructive transverse colon due to hernia and to a supraumbilical hernia Sodium 130,  creatinine 2.38 BUN 89, total bili 1.7, white blood cell count 12.1, hemoglobin 15.3, platelet count 407 Urinalysis positive  Assessment and Plan: * Obstruction of transverse colon (HCC) Due to herniation into supraumbilical hernia.  Case discussed with Dr. Rodolph and he will bring to the operating room.  Acute kidney injury superimposed on CKD AKI on CKD stage IIIb.  IV fluid hydration.  Hold losartan  and hydrochlorothiazide  Nausea vomiting and diarrhea Stool studies ordered but could be secondary to obstruction  Hyponatremia IV fluid hydration  Acute cystitis without hematuria On Rocephin  COPD (chronic obstructive pulmonary disease) (HCC) Respiratory status stable  History of heart block Has pacemaker  Obesity, Class III, BMI 40-49.9 (morbid obesity) (HCC) BMI 41.13      Advance Care Planning: Full code  Consults: General Surgery  Family Communication: Spoke with husband  Severity of Illness: The appropriate patient status for this patient is OBSERVATION. Observation status is judged to be reasonable and necessary in order to provide the required intensity of service to ensure the patient's safety. The patient's presenting  symptoms, physical exam findings, and initial radiographic and laboratory data in the context of their medical condition is felt to place them at decreased risk for further clinical deterioration. Furthermore, it is anticipated that the patient will be medically stable for discharge from the hospital within 2 midnights of admission.   Author: Charlie Patterson, MD 11/05/2023 2:52 PM  For on call review [email or web address].

## 2023-11-05 NOTE — ED Notes (Signed)
 Assumed care of pt from [facility].

## 2023-11-06 ENCOUNTER — Encounter: Payer: Self-pay | Admitting: General Surgery

## 2023-11-06 DIAGNOSIS — N1832 Chronic kidney disease, stage 3b: Secondary | ICD-10-CM

## 2023-11-06 DIAGNOSIS — N3000 Acute cystitis without hematuria: Secondary | ICD-10-CM

## 2023-11-06 DIAGNOSIS — N179 Acute kidney failure, unspecified: Secondary | ICD-10-CM | POA: Diagnosis not present

## 2023-11-06 DIAGNOSIS — K56609 Unspecified intestinal obstruction, unspecified as to partial versus complete obstruction: Secondary | ICD-10-CM | POA: Diagnosis not present

## 2023-11-06 LAB — CBC
HCT: 38.6 % (ref 36.0–46.0)
Hemoglobin: 12.3 g/dL (ref 12.0–15.0)
MCH: 27 pg (ref 26.0–34.0)
MCHC: 31.9 g/dL (ref 30.0–36.0)
MCV: 84.8 fL (ref 80.0–100.0)
Platelets: 279 K/uL (ref 150–400)
RBC: 4.55 MIL/uL (ref 3.87–5.11)
RDW: 15.9 % — ABNORMAL HIGH (ref 11.5–15.5)
WBC: 8.7 K/uL (ref 4.0–10.5)
nRBC: 0 % (ref 0.0–0.2)

## 2023-11-06 LAB — BASIC METABOLIC PANEL WITH GFR
Anion gap: 15 (ref 5–15)
BUN: 77 mg/dL — ABNORMAL HIGH (ref 8–23)
CO2: 19 mmol/L — ABNORMAL LOW (ref 22–32)
Calcium: 8 mg/dL — ABNORMAL LOW (ref 8.9–10.3)
Chloride: 102 mmol/L (ref 98–111)
Creatinine, Ser: 2.01 mg/dL — ABNORMAL HIGH (ref 0.44–1.00)
GFR, Estimated: 26 mL/min — ABNORMAL LOW (ref >60)
Glucose, Bld: 179 mg/dL — ABNORMAL HIGH (ref 70–99)
Potassium: 3.8 mmol/L (ref 3.5–5.1)
Sodium: 136 mmol/L (ref 135–145)

## 2023-11-06 LAB — GLUCOSE, CAPILLARY
Glucose-Capillary: 177 mg/dL — ABNORMAL HIGH (ref 70–99)
Glucose-Capillary: 180 mg/dL — ABNORMAL HIGH (ref 70–99)
Glucose-Capillary: 250 mg/dL — ABNORMAL HIGH (ref 70–99)
Glucose-Capillary: 298 mg/dL — ABNORMAL HIGH (ref 70–99)

## 2023-11-06 MED ORDER — SODIUM BICARBONATE 650 MG PO TABS
650.0000 mg | ORAL_TABLET | Freq: Two times a day (BID) | ORAL | Status: DC
  Administered 2023-11-06 – 2023-11-07 (×3): 650 mg via ORAL
  Filled 2023-11-06 (×3): qty 1

## 2023-11-06 MED ORDER — LACTATED RINGERS IV SOLN: INTRAVENOUS | Status: AC

## 2023-11-06 NOTE — Care Management Obs Status (Signed)
 MEDICARE OBSERVATION STATUS NOTIFICATION   Patient Details  Name: Lorraine Boone MRN: [id number] Date of Birth: 1950-02-11   Medicare Observation Status Notification Given:  Yes    [name] 11/06/2023, 10:28 AM

## 2023-11-06 NOTE — Progress Notes (Signed)
  Progress Note   Patient: Lorraine Boone FMW:[id number] DOB: March 30, 1950 DOA: 11/05/2023     0 DOS: the patient was seen and examined on 11/06/2023   Brief hospital course: Lorraine Boone is a 73 y.o. female with medical history significant of diastolic  congestive heart failure, COPD, pacemaker placement, hypertension and hyperlipidemia.  She presents to the hospital with nausea vomiting diarrhea going on for 1 week.  CT scan showed obstruction of the transverse colon due to herniation of supraumbilical hernia. Patient had a laparoscopic hernia repair with mesh on 10/14.    Principal Problem:   Obstruction of transverse colon (HCC) Active Problems:   Acute kidney injury superimposed on stage 3b chronic kidney disease (HCC)   Nausea vomiting and diarrhea   Hyponatremia   Acute cystitis without hematuria   COPD (chronic obstructive pulmonary disease) (HCC)   History of heart block   Diarrhea   Obesity, Class III, BMI 40-49.9 (morbid obesity) (HCC)   Assessment and Plan: * Obstruction of transverse colon (HCC) Due to herniation into supraumbilical hernia.   Patient had hernia repair, condition has improved, tolerating diet.  Acute kidney injury superimposed on stage 3b chronic kidney disease (HCC) Hyponatremia. Metabolic acidosis. Renal function gradually improving, will continue another day of the lactated Ringer's solution.  Added sodium bicarb.  Sodium level has normalized.  Acute cystitis without hematuria On Rocephin, urine culture pending.  COPD (chronic obstructive pulmonary disease) (HCC) Respiratory status stable  History of heart block Has pacemaker  Obesity, Class III, BMI 40-49.9 (morbid obesity) (HCC) BMI 41.13       Subjective:  Patient feels better, tolerating diet.  Physical Exam: Vitals:   11/05/23 2247 11/06/23 0146 11/06/23 0338 11/06/23 0756  BP: (!) 98/51 (!) 128/41 (!) 105/46 (!) 120/50  Pulse: 69  72 77  Resp:   18 17  Temp: 99 F (37.2 C)   97.6 F (36.4 C) 98.1 F (36.7 C)  TempSrc: Oral  Oral   SpO2: 97%  96% 98%  Weight:      Height:       General exam: Appears calm and comfortable  Respiratory system: Clear to auscultation. Respiratory effort normal. Cardiovascular system: S1 & S2 heard, RRR. No JVD, murmurs, rubs, gallops or clicks. No pedal edema. Gastrointestinal system: Abdomen is nondistended, soft and nontender. No organomegaly or masses felt. Normal bowel sounds heard. Central nervous system: Alert and oriented. No focal neurological deficits. Extremities: Symmetric 5 x 5 power. Skin: No rashes, lesions or ulcers Psychiatry: Judgement and insight appear normal. Mood & affect appropriate.    Data Reviewed:  CT scan the lab results reviewed.  Family Communication: Husband updated at bedside  Disposition: Status is: Observation      Time spent: 35 minutes  Author: Murvin Mana, MD 11/06/2023 3:12 PM  For on call review [email or web address].

## 2023-11-06 NOTE — NC FL2 (Signed)
 [place]  MEDICAID [id number] LEVEL OF CARE FORM     IDENTIFICATION  Patient Name: Lorraine Boone Birthdate: 05/28/1950 Sex: female Admission Date (Current Location): 11/05/2023  [facility] and [place] Number:  Chiropodist and Address:         Provider Number: [id number]  Attending Physician Name and Address:  Laurita Pillion, MD  Relative Name and Phone Number:       Current Level of Care: Hospital Recommended Level of Care: Skilled Nursing Facility Prior Approval Number:    Date Approved/Denied:   PASRR Number: [id number]  Discharge Plan: SNF    Current Diagnoses: Patient Active Problem List   Diagnosis Date Noted   Acute kidney injury superimposed on stage 3b chronic kidney disease (HCC) 11/05/2023   Obstruction of transverse colon (HCC) 11/05/2023   Nausea vomiting and diarrhea 11/05/2023   Hyponatremia 11/05/2023   Acute cystitis without hematuria 11/05/2023   History of heart block 11/05/2023   Diarrhea 11/05/2023   Obesity, Class III, BMI 40-49.9 (morbid obesity) (HCC) 11/05/2023   Presence of heart assist device (HCC) 10/03/2023   COPD (chronic obstructive pulmonary disease) (HCC) 10/03/2023   Morbid obesity (HCC) 10/03/2023   Chronic respiratory failure with hypoxia (HCC) 10/03/2023   Hypertension associated with diabetes (HCC) 09/26/2023   Combined hyperlipidemia associated with type 2 diabetes mellitus (HCC) 09/26/2023   Breast cancer screening by mammogram 09/26/2023   Complete heart block (HCC) 08/27/2019   Pernicious anemia 12/25/2018   Iron  deficiency anemia due to chronic blood loss 08/18/2018   Hyperlipidemia 05/03/2016   Acute diastolic CHF (congestive heart failure) (HCC) 01/03/2016   Cardiac pacemaker 01/03/2016   Acute respiratory failure with hypoxia (HCC) 12/23/2015   Intermittent complete heart block (HCC) 12/20/2015   Diabetes mellitus type 2, insulin  dependent (HCC) 12/20/2015   Essential hypertension 12/20/2015   Sinus bradycardia  12/20/2015    Orientation RESPIRATION BLADDER Height & Weight     Self, Time, Situation, Place  Normal Continent Weight: 101.6 kg Height:  5' 3 (160 cm)  BEHAVIORAL SYMPTOMS/MOOD NEUROLOGICAL BOWEL NUTRITION STATUS      Continent Diet (Heart Healthy)  AMBULATORY STATUS COMMUNICATION OF NEEDS Skin   Limited Assist Verbally Surgical wounds (left abdomen, left upper abdomen x2, left lower abdomen- all sites covered with liquid skin adhesive)                       Personal Care Assistance Level of Assistance  Bathing, Dressing Bathing Assistance: Limited assistance   Dressing Assistance: Limited assistance     Functional Limitations Info             SPECIAL CARE FACTORS FREQUENCY                       Contractures Contractures Info: Not present    Additional Factors Info  Code Status, Allergies Code Status Info: Full Code Allergies Info: NKA           Current Medications (11/06/2023):  This is the current hospital active medication list Current Facility-Administered Medications  Medication Dose Route Frequency Provider Last Rate Last Admin   acetaminophen  (TYLENOL ) tablet 650 mg  650 mg Oral Q6H PRN Rodolph Romano, MD       cefTRIAXone (ROCEPHIN) 1 g in sodium chloride  0.9 % 100 mL IVPB  1 g Intravenous Q24H Rodolph Romano, MD 200 mL/hr at 11/06/23 0909 1 g at 11/06/23 0909   HYDROcodone-acetaminophen  (NORCO/VICODIN) 5-325 MG per tablet 1 tablet  1 tablet Oral Q4H PRN Cintron-Diaz, Edgardo, MD   1 tablet at 11/06/23 9373   insulin  aspart (novoLOG ) injection 0-5 Units  0-5 Units Subcutaneous QHS Rodolph Romano, MD       insulin  aspart (novoLOG ) injection 0-6 Units  0-6 Units Subcutaneous TID WC Rodolph Romano, MD   1 Units at 11/06/23 1256   ipratropium-albuterol  (DUONEB) 0.5-2.5 (3) MG/3ML nebulizer solution 3 mL  3 mL Nebulization Q6H PRN Rodolph Romano, MD       lactated ringers infusion   Intravenous Continuous Laurita Pillion, MD       metoprolol  tartrate (LOPRESSOR ) tablet 25 mg  25 mg Oral BID Cintron-Diaz, Edgardo, MD   25 mg at 11/06/23 0910   morphine (PF) 4 MG/ML injection 4 mg  4 mg Intravenous Q3H PRN Rodolph Romano, MD   4 mg at 11/06/23 0907   ondansetron  (ZOFRAN ) tablet 4 mg  4 mg Oral Q6H PRN Rodolph Romano, MD       Or   ondansetron  (ZOFRAN ) injection 4 mg  4 mg Intravenous Q6H PRN Rodolph Romano, MD       rosuvastatin  (CRESTOR ) tablet 10 mg  10 mg Oral Daily Cintron-Diaz, Edgardo, MD   10 mg at 11/06/23 0910   sodium bicarbonate tablet 650 mg  650 mg Oral BID Zhang, Dekui, MD         Discharge Medications: Please see discharge summary for a list of discharge medications.  Relevant Imaging Results:  Relevant Lab Results:   Additional Information SS [id number]  Daved JONETTA Hamilton, RN

## 2023-11-06 NOTE — Evaluation (Signed)
 Physical Therapy Evaluation Patient Details Name: Lorraine Boone MRN: [id number] DOB: 02-28-1950 Today's Date: 11/06/2023  History of Present Illness  Pt is a 73 y/o F presenting to ED with N/V and diarrhea. CT imaging found obstructive transverse colon due to hernia and a supraumbilical hernia. Pt underwent robotic laparoscopic incisional hernia repair with mesh on 10/14. PMH significant for dCHF, COPD, pacemaker placement, HTN, HLD.   Clinical Impression  Pt A&Ox4, agreeable to participate in PT evaluation. At baseline, pt reports being a household amb with rollator, IND with ADLs. Pt was met semi-supine in bed, CGA for rolling to sidelying with modA trunk/BLE assist for sidelying <> sit transfers. Max multimodal cues for logroll sequencing, heavy use of bed features. Pt performed STS from EOB and able to transfer to [facility] with minA to achieve standing and stabilize RW. MaxA for pericare in standing. Pt amb ~40ft from [facility] toward HOB with CGA, no LOB, short shuffled gait pattern throughout. Education provided during session on gradually increasing mobility throughout the day to continue progressing her activity tolerance and capacity for ambulation. Pt was left semi-supine in bed at end of session, all needs in reach. Pt would benefit from skilled PT intervention to address listed deficits (see PT Problem List) and allow for safe return to PLOF.        If plan is discharge home, recommend the following: A little help with walking and/or transfers;A lot of help with bathing/dressing/bathroom;Assistance with cooking/housework;Assist for transportation;Help with stairs or ramp for entrance   Can travel by private vehicle   No    Equipment Recommendations Other (comment) (TBD at next venue of care)  Recommendations for Other Services  OT consult    Functional Status Assessment Patient has had a recent decline in their functional status and demonstrates the ability to make significant improvements in  function in a reasonable and predictable amount of time.     Precautions / Restrictions Precautions Precautions: Fall;ICD/Pacemaker Recall of Precautions/Restrictions: Intact Restrictions Weight Bearing Restrictions Per Provider Order: No      Mobility  Bed Mobility Overal bed mobility: Needs Assistance Bed Mobility: Rolling, Sidelying to Sit, Sit to Sidelying Rolling: Contact guard assist, Used rails Sidelying to sit: Mod assist, HOB elevated, Used rails     Sit to sidelying: Mod assist, Used rails General bed mobility comments: heavy use of bed features for bed mobility, modA BLE assist into/out of bed, multimodal cues for logroll sequencing    Transfers Overall transfer level: Needs assistance Equipment used: Rolling walker (2 wheels) Transfers: Sit to/from Stand, Bed to chair/wheelchair/BSC Sit to Stand: Min assist, From elevated surface, Contact guard assist   Step pivot transfers: Min assist       General transfer comment: STS from elevated EOB with minA to achieve standing. MinA transfer to [facility] to stabilize RW. VC for hand placement on RW    Ambulation/Gait Ambulation/Gait assistance: Contact guard assist Gait Distance (Feet): 5 Feet Assistive device: Rolling walker (2 wheels) Gait Pattern/deviations: Step-through pattern, Decreased stride length, Trunk flexed, Wide base of support Gait velocity: decreased     General Gait Details: Pt able to take short, shuffled steps forward toward [facility] with no LOB, poor activity tolerance and self-limited distance  Stairs            Wheelchair Mobility     Tilt Bed    Modified Rankin (Stroke Patients Only)       Balance Overall balance assessment: Needs assistance Sitting-balance support: Feet supported Sitting balance-Leahy Scale: Fair  Sitting balance - Comments: UE support required for dynamic balance   Standing balance support: Bilateral upper extremity supported, Reliant on assistive device for  balance Standing balance-Leahy Scale: Fair Standing balance comment: heavy BUE support on RW                             Pertinent Vitals/Pain Pain Assessment Pain Assessment: Faces Faces Pain Scale: Hurts even more Pain Location: stomach, with movement Pain Descriptors / Indicators: Grimacing, Sore, Aching Pain Intervention(s): Limited activity within patient's tolerance, Monitored during session, Repositioned    Home Living Family/patient expects to be discharged to:: Private residence Living Arrangements: Spouse/significant other Available Help at Discharge: Family;Available PRN/intermittently Type of Home: Mobile home Home Access: Stairs to enter Entrance Stairs-Rails: Right Entrance Stairs-Number of Steps: 5   Home Layout: One level Home Equipment: Rollator (4 wheels);Cane - single point      Prior Function Prior Level of Function : Independent/Modified Independent             Mobility Comments: Pt reports using rollator intermittently for amb, mainly household ambulator ADLs Comments: IND with ADLs, husband drives and goes to store to get groceries     Extremity/Trunk Assessment   Upper Extremity Assessment Upper Extremity Assessment: Overall WFL for tasks assessed    Lower Extremity Assessment Lower Extremity Assessment: Generalized weakness       Communication   Communication Communication: No apparent difficulties    Cognition Arousal: Alert Behavior During Therapy: WFL for tasks assessed/performed   PT - Cognitive impairments: No apparent impairments                       PT - Cognition Comments: A&Ox4 Following commands: Intact       Cueing Cueing Techniques: Verbal cues, Tactile cues, Visual cues     General Comments General comments (skin integrity, edema, etc.): maxA for pericare in standing    Exercises     Assessment/Plan    PT Assessment Patient needs continued PT services  PT Problem List Decreased  strength;Decreased activity tolerance;Decreased balance;Decreased mobility;Pain       PT Treatment Interventions DME instruction;Gait training;Stair training;Functional mobility training;Therapeutic activities;Therapeutic exercise;Balance training;Neuromuscular re-education;Patient/family education    PT Goals (Current goals can be found in the Care Plan section)  Acute Rehab PT Goals Patient Stated Goal: to get better PT Goal Formulation: With patient Time For Goal Achievement: 11/20/23 Potential to Achieve Goals: Fair    Frequency Min 2X/week     Co-evaluation               AM-PAC PT 6 Clicks Mobility  Outcome Measure Help needed turning from your back to your side while in a flat bed without using bedrails?: A Little Help needed moving from lying on your back to sitting on the side of a flat bed without using bedrails?: A Lot Help needed moving to and from a bed to a chair (including a wheelchair)?: A Little Help needed standing up from a chair using your arms (e.g., wheelchair or bedside chair)?: A Little Help needed to walk in hospital room?: A Little Help needed climbing 3-5 steps with a railing? : A Lot 6 Click Score: 16    End of Session   Activity Tolerance: Patient limited by fatigue;Patient limited by pain Patient left: in bed;with call bell/phone within reach;with bed alarm set Nurse Communication: Mobility status PT Visit Diagnosis: Other abnormalities of gait and mobility (  R26.89);Muscle weakness (generalized) (M62.81);Difficulty in walking, not elsewhere classified (R26.2);Pain Pain - part of body:  (stomach)    Time: 1132-1205 PT Time Calculation (min) (ACUTE ONLY): 33 min   Charges:   PT Evaluation $PT Eval Moderate Complexity: 1 Mod PT Treatments $Therapeutic Activity: 8-22 mins PT General Charges $$ ACUTE PT VISIT: 1 Visit         Janell Axe, SPT

## 2023-11-06 NOTE — TOC Initial Note (Signed)
 Transition of Care [facility]) - Initial/Assessment Note    Patient Details  Name: Lorraine Boone MRN: [id number] Date of Birth: 02-Oct-1950  Transition of Care [facility]) CM/SW Contact:    Lorraine JONETTA Hamilton, RN Phone Number: 11/06/2023, 4:19 PM  Clinical Narrative:                  Admitted for: Obstruction of transverse colon due to herniation of supraumbilical hernia. Patient had lap hernia repair with mesh on 10/14 Admitted from: Home PCP: Dr. Fredy Boone Pharmacy: [facility] [place], [place] Current home health/prior home health/DME: N/A   Met with patient at bedside. Discussed with patient discharge planning and that therapy recommends SNF for rehab. Patient verbally requests this RN CM speak to her spouse Lorraine Boone regarding any discharge planning and decisions and that she will be agreeable to whatever he decides. Left VM for Lorraine Boone to return my call. PASSAR obtained Fl2 sent Facility search initiated     Expected Discharge Plan: IP Rehab Facility     Patient Goals and CMS Choice            Expected Discharge Plan and Services   Discharge Planning Services: CM Consult   Living arrangements for the past 2 months: Mobile Home                                      Prior Living Arrangements/Services Living arrangements for the past 2 months: Mobile Home Lives with:: Spouse Patient language and need for interpreter reviewed:: Yes Do you feel safe going back to the place where you live?: Yes      Need for Family Participation in Patient Care: Yes (Comment) (Pt verbally request this RN CM speak with spouse regarding discharge decision making.)   Current home services: DME (has rollator) Criminal Activity/Legal Involvement Pertinent to Current Situation/Hospitalization: No - Comment as needed  Activities of Daily Living   ADL Screening (condition at time of admission) Independently performs ADLs?: No Does the patient have a NEW difficulty with  bathing/dressing/toileting/self-feeding that is expected to last >3 days?: No Does the patient have a NEW difficulty with getting in/out of bed, walking, or climbing stairs that is expected to last >3 days?: No Does the patient have a NEW difficulty with communication that is expected to last >3 days?: No Is the patient deaf or have difficulty hearing?: No Does the patient have difficulty seeing, even when wearing glasses/contacts?: No Does the patient have difficulty concentrating, remembering, or making decisions?: No  Permission Sought/Granted Permission sought to share information with : Family Supports Permission granted to share information with : Yes, Verbal Permission Granted  Share Information with NAME: Lorraine Boone     Permission granted to share info w Relationship: Spouse  Permission granted to share info w Contact Information: [phone]  Emotional Assessment Appearance:: Appears stated age     Orientation: : Oriented to Self, Oriented to Place, Oriented to  Time, Oriented to Situation   Psych Involvement: No (comment)  Admission diagnosis:  Dehydration [E86.0] Acute cystitis without hematuria [N30.00] AKI (acute kidney injury) [N17.9] Acute kidney injury superimposed on CKD [N17.9, N18.9] Diarrhea, unspecified type [R19.7] Patient Active Problem List   Diagnosis Date Noted   Acute kidney injury superimposed on stage 3b chronic kidney disease (HCC) 11/05/2023   Obstruction of transverse colon (HCC) 11/05/2023   Nausea vomiting and diarrhea 11/05/2023   Hyponatremia 11/05/2023   Acute  cystitis without hematuria 11/05/2023   History of heart block 11/05/2023   Diarrhea 11/05/2023   Obesity, Class III, BMI 40-49.9 (morbid obesity) (HCC) 11/05/2023   Presence of heart assist device (HCC) 10/03/2023   COPD (chronic obstructive pulmonary disease) (HCC) 10/03/2023   Morbid obesity (HCC) 10/03/2023   Chronic respiratory failure with hypoxia (HCC) 10/03/2023    Hypertension associated with diabetes (HCC) 09/26/2023   Combined hyperlipidemia associated with type 2 diabetes mellitus (HCC) 09/26/2023   Breast cancer screening by mammogram 09/26/2023   Complete heart block (HCC) 08/27/2019   Pernicious anemia 12/25/2018   Iron  deficiency anemia due to chronic blood loss 08/18/2018   Hyperlipidemia 05/03/2016   Acute diastolic CHF (congestive heart failure) (HCC) 01/03/2016   Cardiac pacemaker 01/03/2016   Acute respiratory failure with hypoxia (HCC) 12/23/2015   Intermittent complete heart block (HCC) 12/20/2015   Diabetes mellitus type 2, insulin  dependent (HCC) 12/20/2015   Essential hypertension 12/20/2015   Sinus bradycardia 12/20/2015   PCP:  Lorraine Lorraine RAMAN, MD Pharmacy:   [facility] [id number] [address] [place], [place] - [address] AT [facility] OF [address] [place] [address] [place] [address] Phone: [phone] Fax: [phone]     Social Drivers of Health (SDOH) Social History: SDOH Screenings   Food Insecurity: No Food Insecurity (11/05/2023)  Housing: Low Risk  (11/05/2023)  Transportation Needs: No Transportation Needs (11/05/2023)  Utilities: Not At Risk (11/05/2023)  Depression (PHQ2-9): Low Risk  (08/16/2023)  Social Connections: Moderately Isolated (11/05/2023)  Tobacco Use: Medium Risk (11/05/2023)   SDOH Interventions:     Readmission Risk Interventions     No data to display

## 2023-11-06 NOTE — Discharge Instructions (Signed)

## 2023-11-06 NOTE — Hospital Course (Signed)
 Lorraine Boone is a 73 y.o. female with medical history significant of diastolic  congestive heart failure, COPD, pacemaker placement, hypertension and hyperlipidemia.  She presents to the hospital with nausea vomiting diarrhea going on for 1 week.  CT scan showed obstruction of the transverse colon due to herniation of supraumbilical hernia. Patient had a laparoscopic hernia repair with mesh on 10/14.  Condition improving, pending nursing home placement.

## 2023-11-06 NOTE — Progress Notes (Addendum)
 [facility]- General Surgery  SURGICAL PROGRESS NOTE  Hospital Day(s): 1.   Post op day(s): 1 Day Post-Op.   Interval History:  Patient is s/p day one from robotic assisted laparoscopic incisional hernia repair with mesh. Multiple incisional hernias were found during surgery yesterday. This morning patient is sore as expected status post day 1. States she had full liquid diet including chicken broth and ice cream and tolerated well. Denies any nausea or vomiting.  Overall doing well, appeared comfortable during encounter this morning.  Vital signs in last 24 hours: [min-max] current  Temp:  [96.2 F (35.7 C)-99 F (37.2 C)] 98.1 F (36.7 C) (10/15 0756) Pulse Rate:  [57-77] 77 (10/15 0756) Resp:  [15-20] 17 (10/15 0756) BP: (88-131)/(32-88) 120/50 (10/15 0756) SpO2:  [95 %-98 %] 98 % (10/15 0756) Weight:  [101.6 kg-102 kg] 101.6 kg (10/14 1528)     Height: 5' 3 (160 cm) Weight: 101.6 kg BMI (Calculated): 39.69   Intake/Output last 2 shifts:  10/14 0701 - 10/15 0700 In: 4510 [P.O.:100; I.V.:2060; IV Piggyback:2350] Out: -    Physical Exam:  Constitutional: alert, cooperative and no distress  Respiratory: breathing non-labored at rest  Cardiovascular: regular rate and sinus rhythm  Gastrointestinal: soft, mildly tender, and non-distended, surgical incisions on left side of abdomen are dry, with surgical glue intact.  Labs:     Latest Ref Rng & Units 11/06/2023    6:39 AM 11/05/2023   10:30 AM 08/15/2023   10:10 AM  CBC  WBC 4.0 - 10.5 K/uL 8.7  12.1  10.0   Hemoglobin 12.0 - 15.0 g/dL 87.6  84.6  85.0   Hematocrit 36.0 - 46.0 % 38.6  48.2  49.8   Platelets 150 - 400 K/uL 279  407  279       Latest Ref Rng & Units 11/06/2023    6:39 AM 11/05/2023   10:30 AM 09/26/2023   11:35 AM  CMP  Glucose 70 - 99 mg/dL 820  813  95   BUN 8 - 23 mg/dL 77  89  28   Creatinine 0.44 - 1.00 mg/dL 7.98  7.61  8.55   Sodium 135 - 145 mmol/L 136  130  143   Potassium 3.5 - 5.1 mmol/L  3.8  4.9  4.6   Chloride 98 - 111 mmol/L 102  97  104   CO2 22 - 32 mmol/L 19  18  23    Calcium  8.9 - 10.3 mg/dL 8.0  8.8  9.8   Total Protein 6.5 - 8.1 g/dL  7.2  7.3   Total Bilirubin 0.0 - 1.2 mg/dL  1.7  0.5   Alkaline Phos 38 - 126 U/L  74  92   AST 15 - 41 U/L  33  19   ALT 0 - 44 U/L  16  11     Imaging studies: No new pertinent imaging studies   Assessment/Plan:  73 y.o. female with multiple incisional hernias with large bowel obstruction 1 Day Post-Op s/p robotic assisted laparoscopic incisional hernia repair with mesh, complicated by pertinent comorbidities including diabetes type 2 insulin -dependent, essential hypertension, hyperlipidemia, acute diastolic CHF, pacemaker, COPD, and obesity.    - No fever, and non-tachycardic  - Leukocytosis resolved  - Advance to a heart healthy diet  - Overall recovering well, abdominal pain is minimal and expected. Once patient tolerates solid food, she will be clear from surgical standpoint and may be discharged once medically stable.   -Recommend ambulation  -  Continue pain management   Acute on chronic kidney disease   - CKD stage IIIb per chart review   - Creatinine improving 2.38 >> 2.01, managed with IV fluids per primary team  - Continue to follow hospitalist recommendations  -- Dasean Brow Barrientos PA-C

## 2023-11-07 ENCOUNTER — Ambulatory Visit: Admitting: Internal Medicine

## 2023-11-07 DIAGNOSIS — N179 Acute kidney failure, unspecified: Secondary | ICD-10-CM | POA: Diagnosis not present

## 2023-11-07 DIAGNOSIS — N1832 Chronic kidney disease, stage 3b: Secondary | ICD-10-CM | POA: Diagnosis not present

## 2023-11-07 DIAGNOSIS — K56609 Unspecified intestinal obstruction, unspecified as to partial versus complete obstruction: Secondary | ICD-10-CM | POA: Diagnosis not present

## 2023-11-07 DIAGNOSIS — N3000 Acute cystitis without hematuria: Secondary | ICD-10-CM | POA: Diagnosis not present

## 2023-11-07 LAB — BASIC METABOLIC PANEL WITH GFR
Anion gap: 9 (ref 5–15)
BUN: 67 mg/dL — ABNORMAL HIGH (ref 8–23)
CO2: 24 mmol/L (ref 22–32)
Calcium: 8.5 mg/dL — ABNORMAL LOW (ref 8.9–10.3)
Chloride: 104 mmol/L (ref 98–111)
Creatinine, Ser: 1.77 mg/dL — ABNORMAL HIGH (ref 0.44–1.00)
GFR, Estimated: 30 mL/min — ABNORMAL LOW (ref >60)
Glucose, Bld: 210 mg/dL — ABNORMAL HIGH (ref 70–99)
Potassium: 4.4 mmol/L (ref 3.5–5.1)
Sodium: 137 mmol/L (ref 135–145)

## 2023-11-07 LAB — CBC
HCT: 36.7 % (ref 36.0–46.0)
Hemoglobin: 11.5 g/dL — ABNORMAL LOW (ref 12.0–15.0)
MCH: 26.9 pg (ref 26.0–34.0)
MCHC: 31.3 g/dL (ref 30.0–36.0)
MCV: 85.7 fL (ref 80.0–100.0)
Platelets: 257 K/uL (ref 150–400)
RBC: 4.28 MIL/uL (ref 3.87–5.11)
RDW: 16.1 % — ABNORMAL HIGH (ref 11.5–15.5)
WBC: 10.9 K/uL — ABNORMAL HIGH (ref 4.0–10.5)
nRBC: 0 % (ref 0.0–0.2)

## 2023-11-07 LAB — GLUCOSE, CAPILLARY
Glucose-Capillary: 208 mg/dL — ABNORMAL HIGH (ref 70–99)
Glucose-Capillary: 301 mg/dL — ABNORMAL HIGH (ref 70–99)
Glucose-Capillary: 215 mg/dL — ABNORMAL HIGH (ref 70–99)
Glucose-Capillary: 245 mg/dL — ABNORMAL HIGH (ref 70–99)

## 2023-11-07 LAB — URINE CULTURE
Culture: >=100000 — AB
Special Requests: NORMAL

## 2023-11-07 LAB — MAGNESIUM: Magnesium: 2.8 mg/dL — ABNORMAL HIGH (ref 1.7–2.4)

## 2023-11-07 MED ORDER — CEPHALEXIN 500 MG PO CAPS
500.0000 mg | ORAL_CAPSULE | Freq: Two times a day (BID) | ORAL | Status: DC
  Administered 2023-11-08: 500 mg via ORAL
  Filled 2023-11-07: qty 1

## 2023-11-07 MED ORDER — SENNOSIDES-DOCUSATE SODIUM 8.6-50 MG PO TABS
2.0000 | ORAL_TABLET | Freq: Two times a day (BID) | ORAL | Status: DC
  Administered 2023-11-07 – 2023-11-08 (×3): 2 via ORAL
  Filled 2023-11-07 (×3): qty 2

## 2023-11-07 NOTE — Inpatient Diabetes Management (Addendum)
 Inpatient Diabetes Program Recommendations  AACE/ADA: New Consensus Statement on Inpatient Glycemic Control (2015)  Target Ranges:  Prepandial:   less than 140 mg/dL      [facility] postprandial:   less than 180 mg/dL (1-2 hours)      Critically ill patients:  140 - 180 mg/dL    Latest Reference Range & Units 11/06/23 08:39 11/06/23 12:07 11/06/23 16:19 11/06/23 20:55  Glucose-Capillary 70 - 99 mg/dL 822 (H)  1 unit Novolog   180 (H)  1 unit Novolog   250 (H)  1 unit Novolog   298 (H)  3 units Novolog    (H): Data is abnormally high  Latest Reference Range & Units 11/07/23 08:03  Glucose-Capillary 70 - 99 mg/dL 791 (H)  2 units Novolog    (H): Data is abnormally high   Admit with:  Obstruction of transverse colon Due to herniation into supraumbilical hernia  History: DM  Home DM Meds: Glipizide  5 mg daily       Novolin  70/30 Insulin  30 units AM/ 25 units PM       Jardiance 25 mg daily  Current Orders: Novolog  0-6 units TID AC + HS    MD- Note pt takes 70/30 Insulin  at home CBG 208 this AM CBGs yesterday afternoon elevated  Please consider:  1. Start Lantus  5 units daily (0.05 units/kg)  2. Increase the Novolog  SSI to the 0-15 unit scale (Moderate scale)    --Will follow patient during hospitalization--  Adina Rudolpho Arrow RN, MSN, CDCES Diabetes Coordinator Inpatient Glycemic Control Team Team Pager: [phone] (8a-5p)

## 2023-11-07 NOTE — Progress Notes (Signed)
  Progress Note   Patient: Lorraine Boone FMW:[id number] DOB: Feb 28, 1950 DOA: 11/05/2023     0 DOS: the patient was seen and examined on 11/07/2023   Brief hospital course: Lorraine Boone is a 73 y.o. female with medical history significant of diastolic  congestive heart failure, COPD, pacemaker placement, hypertension and hyperlipidemia.  She presents to the hospital with nausea vomiting diarrhea going on for 1 week.  CT scan showed obstruction of the transverse colon due to herniation of supraumbilical hernia. Patient had a laparoscopic hernia repair with mesh on 10/14.    Principal Problem:   Obstruction of transverse colon (HCC) Active Problems:   Acute kidney injury superimposed on stage 3b chronic kidney disease (HCC)   Nausea vomiting and diarrhea   Hyponatremia   Acute cystitis without hematuria   COPD (chronic obstructive pulmonary disease) (HCC)   History of heart block   Diarrhea   Obesity, Class III, BMI 40-49.9 (morbid obesity) (HCC)   Assessment and Plan: * Obstruction of transverse colon (HCC) Due to herniation into supraumbilical hernia.   Patient had hernia repair, condition has improved, tolerating diet. Continue doing well, followed by general surgery.   Acute kidney injury superimposed on stage 3b chronic kidney disease (HCC) Hyponatremia. Metabolic acidosis. Creatinine level had dropped down to 1.77, close to baseline.  Metabolic acidosis and hyponatremia has resolved.  IV fluids will be discontinued at around 4 PM today..   Acute cystitis without hematuria secondary to Klebsiella. Change oral antibiotics for total antibiotic course of 5 days.   COPD (chronic obstructive pulmonary disease) (HCC) Respiratory status stable   History of heart block Has pacemaker   Obesity, Class III, BMI 40-49.9 (morbid obesity) (HCC) BMI 41.13        Subjective:  Patient doing well today, currently has no abdominal pain.  Feels constipated, senna started.  Physical  Exam: Vitals:   11/06/23 1946 11/06/23 2148 11/07/23 0447 11/07/23 0818  BP: (!) 113/43 (!) 115/57 (!) 131/52 (!) 141/33  Pulse: 70 74 71 74  Resp: 16  16 17   Temp: 97.8 F (36.6 C)  98 F (36.7 C) 97.7 F (36.5 C)  TempSrc: Oral   Oral  SpO2: 97%  99% 99%  Weight:      Height:       General exam: Appears calm and comfortable  Respiratory system: Clear to auscultation. Respiratory effort normal. Cardiovascular system: S1 & S2 heard, RRR. No JVD, murmurs, rubs, gallops or clicks. No pedal edema. Gastrointestinal system: Abdomen is nondistended, soft and nontender. No organomegaly or masses felt. Normal bowel sounds heard. Central nervous system: Alert and oriented. No focal neurological deficits. Extremities: Symmetric 5 x 5 power. Skin: No rashes, lesions or ulcers Psychiatry: Judgement and insight appear normal. Mood & affect appropriate.    Data Reviewed:  Lab results reviewed.  Family Communication: Husband updated bedside  Disposition: Status is: Observation Unsafe discharge, pending nursing placement     Time spent: 35 minutes  Author: Murvin Mana, MD 11/07/2023 10:18 AM  For on call review [email or web address].

## 2023-11-07 NOTE — Progress Notes (Signed)
 Physical Therapy Treatment Patient Details Name: Lorraine Boone MRN: [id number] DOB: May 24, 1950 Today's Date: 11/07/2023   History of Present Illness Pt is a 73 y/o F presenting to ED with N/V and diarrhea. CT imaging found obstructive transverse colon due to hernia and a supraumbilical hernia. Pt underwent robotic laparoscopic incisional hernia repair with mesh on 10/14. PMH significant for dCHF, COPD, pacemaker placement, HTN, HLD.    PT Comments  Patient is agreeable to PT session with encouragement. Spouse at the bedside today. The patient is making slow progress overall with mobility. She did increase her ambulation distance but only walked around 12 ft with activity tolerance limited by fatigue and left flank pain. Consider rehabilitation < 3 hours/day after this hospital stay. PT will continue to follow.    If plan is discharge home, recommend the following: A little help with walking and/or transfers;A lot of help with bathing/dressing/bathroom;Assistance with cooking/housework;Assist for transportation;Help with stairs or ramp for entrance   Can travel by private vehicle     No  Equipment Recommendations   (TBD)    Recommendations for Other Services       Precautions / Restrictions Precautions Precautions: Fall Recall of Precautions/Restrictions: Intact Restrictions Weight Bearing Restrictions Per Provider Order: No     Mobility  Bed Mobility Overal bed mobility: Needs Assistance Bed Mobility: Rolling, Sidelying to Sit, Sit to Sidelying Rolling: Contact guard assist Sidelying to sit: Mod assist, HOB elevated, Used rails     Sit to sidelying: Mod assist, Used rails General bed mobility comments: encouraged logroll technique for comfort. increased time and effort required    Transfers Overall transfer level: Needs assistance Equipment used: Rolling walker (2 wheels) Transfers: Sit to/from Stand, Bed to chair/wheelchair/BSC Sit to Stand: Min assist   Step pivot  transfers: Min assist       General transfer comment: steadying assistance provided. cues for safety    Ambulation/Gait Ambulation/Gait assistance: Contact guard assist Gait Distance (Feet): 12 Feet Assistive device: Rolling walker (2 wheels) Gait Pattern/deviations: Step-to pattern, Decreased stride length Gait velocity: decreased     General Gait Details: very slow cadence with guarded movement. patient needs encouragement for ambulation   Stairs             Wheelchair Mobility     Tilt Bed    Modified Rankin (Stroke Patients Only)       Balance Overall balance assessment: Needs assistance Sitting-balance support: Feet supported Sitting balance-Leahy Scale: Fair     Standing balance support: Bilateral upper extremity supported, Reliant on assistive device for balance Standing balance-Leahy Scale: Fair Standing balance comment: using rolling walker for support with ambulation                            Communication Communication Communication: No apparent difficulties  Cognition Arousal: Alert Behavior During Therapy: WFL for tasks assessed/performed   PT - Cognitive impairments: No apparent impairments                         Following commands: Intact      Cueing Cueing Techniques: Verbal cues, Tactile cues, Visual cues  Exercises      General Comments General comments (skin integrity, edema, etc.): spouse at the bedside throughout session      Pertinent Vitals/Pain Pain Assessment Pain Assessment: Faces Faces Pain Scale: Hurts even more Pain Location: L flank Pain Descriptors / Indicators: Discomfort Pain Intervention(s): Limited activity within  patient's tolerance, Monitored during session    Home Living                          Prior Function            PT Goals (current goals can now be found in the care plan section) Acute Rehab PT Goals Patient Stated Goal: to get better PT Goal Formulation:  With patient Time For Goal Achievement: 11/20/23 Potential to Achieve Goals: Fair Progress towards PT goals: Progressing toward goals    Frequency    Min 2X/week      PT Plan      Co-evaluation              AM-PAC PT 6 Clicks Mobility   Outcome Measure  Help needed turning from your back to your side while in a flat bed without using bedrails?: A Little Help needed moving from lying on your back to sitting on the side of a flat bed without using bedrails?: A Lot Help needed moving to and from a bed to a chair (including a wheelchair)?: A Little Help needed standing up from a chair using your arms (e.g., wheelchair or bedside chair)?: A Little Help needed to walk in hospital room?: A Little Help needed climbing 3-5 steps with a railing? : A Lot 6 Click Score: 16    End of Session   Activity Tolerance: Patient limited by fatigue Patient left: in bed;with call bell/phone within reach;with bed alarm set;with family/visitor present Nurse Communication: Mobility status PT Visit Diagnosis: Other abnormalities of gait and mobility (R26.89);Muscle weakness (generalized) (M62.81);Difficulty in walking, not elsewhere classified (R26.2);Pain     Time: 1012-1039 PT Time Calculation (min) (ACUTE ONLY): 27 min  Charges:    $Therapeutic Activity: 23-37 mins PT General Charges $$ ACUTE PT VISIT: 1 Visit                     Lorraine Boone, PT, MPT    Lorraine Boone 11/07/2023, 12:40 PM

## 2023-11-07 NOTE — TOC Progression Note (Signed)
 Transition of Care [facility]) - Progression Note    Patient Details  Name: Lorraine Boone MRN: [id number] Date of Birth: 07/25/1950  Transition of Care [facility]) CM/SW Contact  [name], RN Phone Number: 11/07/2023, 2:03 PM  Clinical Narrative:     Spoke with spouse Aida He is in agreement with SNF Bed offers presented [facility] and notified Tammy at [facility] with Tammy at [facility] and [facility]   Expected Discharge Plan: IP Rehab Facility                 Expected Discharge Plan and Services   Discharge Planning Services: CM Consult   Living arrangements for the past 2 months: Mobile Home                                       Social Drivers of Health (SDOH) Interventions SDOH Screenings   Food Insecurity: No Food Insecurity (11/05/2023)  Housing: Low Risk  (11/05/2023)  Transportation Needs: No Transportation Needs (11/05/2023)  Utilities: Not At Risk (11/05/2023)  Depression (PHQ2-9): Low Risk  (08/16/2023)  Social Connections: Moderately Isolated (11/05/2023)  Tobacco Use: Medium Risk (11/05/2023)    Readmission Risk Interventions     No data to display

## 2023-11-07 NOTE — Progress Notes (Signed)
 Patient ID: Kerrigan Gombos, female   DOB: 12-Nov-1950, 73 y.o.   MRN: [id number]     SURGICAL PROGRESS NOTE   Hospital Day(s): 0.   Interval History: Patient seen and examined, no acute events or new complaints overnight. Patient reports feeling significantly sore in the surgical area.  No specific pain.  Patient is tolerating diet.  Patient is passing gas  Vital signs in last 24 hours: [min-max] current  Temp:  [97.7 F (36.5 C)-98 F (36.7 C)] 97.7 F (36.5 C) (10/16 0818) Pulse Rate:  [70-74] 74 (10/16 0818) Resp:  [16-17] 17 (10/16 0818) BP: (113-141)/(33-57) 141/33 (10/16 0818) SpO2:  [97 %-99 %] 99 % (10/16 0818)     Height: 5' 3 (160 cm) Weight: 101.6 kg BMI (Calculated): 39.69   Physical Exam:  Constitutional: alert, cooperative and no distress  Respiratory: breathing non-labored at rest  Cardiovascular: regular rate and sinus rhythm  Gastrointestinal: soft, non-tender, and non-distended  Labs:     Latest Ref Rng & Units 11/07/2023    5:18 AM 11/06/2023    6:39 AM 11/05/2023   10:30 AM  CBC  WBC 4.0 - 10.5 K/uL 10.9  8.7  12.1   Hemoglobin 12.0 - 15.0 g/dL 88.4  87.6  84.6   Hematocrit 36.0 - 46.0 % 36.7  38.6  48.2   Platelets 150 - 400 K/uL 257  279  407       Latest Ref Rng & Units 11/07/2023    5:18 AM 11/06/2023    6:39 AM 11/05/2023   10:30 AM  CMP  Glucose 70 - 99 mg/dL 789  820  813   BUN 8 - 23 mg/dL 67  77  89   Creatinine 0.44 - 1.00 mg/dL 8.22  7.98  7.61   Sodium 135 - 145 mmol/L 137  136  130   Potassium 3.5 - 5.1 mmol/L 4.4  3.8  4.9   Chloride 98 - 111 mmol/L 104  102  97   CO2 22 - 32 mmol/L 24  19  18    Calcium  8.9 - 10.3 mg/dL 8.5  8.0  8.8   Total Protein 6.5 - 8.1 g/dL   7.2   Total Bilirubin 0.0 - 1.2 mg/dL   1.7   Alkaline Phos 38 - 126 U/L   74   AST 15 - 41 U/L   33   ALT 0 - 44 U/L   16     Imaging studies: No new pertinent imaging studies   Assessment/Plan:  Incisional hernia with large bowel obstruction - S/p repair postop  day #2 - Patient recovering well -She is tolerating current diet -Continue pain management -Pending skin nursing facility authorization   Dehydration due to volume loss - Patient with vomiting and diarrhea.  This has resolved - This was complicated with acute kidney injury.  This is improving - Slowly decreasing creatinine today.  Continue hydration as per primary team  Agree with recommendation of skilled nursing facility.  No further surgical intervention is needed.  Will continue to follow along.    Lucas Petrin, MD

## 2023-11-08 ENCOUNTER — Encounter: Payer: Self-pay | Admitting: Oncology

## 2023-11-08 ENCOUNTER — Other Ambulatory Visit: Payer: Self-pay

## 2023-11-08 DIAGNOSIS — J449 Chronic obstructive pulmonary disease, unspecified: Secondary | ICD-10-CM | POA: Diagnosis not present

## 2023-11-08 DIAGNOSIS — N3000 Acute cystitis without hematuria: Secondary | ICD-10-CM | POA: Diagnosis not present

## 2023-11-08 DIAGNOSIS — K56609 Unspecified intestinal obstruction, unspecified as to partial versus complete obstruction: Secondary | ICD-10-CM | POA: Diagnosis not present

## 2023-11-08 LAB — GLUCOSE, CAPILLARY
Glucose-Capillary: 237 mg/dL — ABNORMAL HIGH (ref 70–99)
Glucose-Capillary: 306 mg/dL — ABNORMAL HIGH (ref 70–99)

## 2023-11-08 LAB — BASIC METABOLIC PANEL WITH GFR
Anion gap: 11 (ref 5–15)
BUN: 46 mg/dL — ABNORMAL HIGH (ref 8–23)
CO2: 27 mmol/L (ref 22–32)
Calcium: 8.4 mg/dL — ABNORMAL LOW (ref 8.9–10.3)
Chloride: 103 mmol/L (ref 98–111)
Creatinine, Ser: 1.39 mg/dL — ABNORMAL HIGH (ref 0.44–1.00)
GFR, Estimated: 40 mL/min — ABNORMAL LOW (ref >60)
Glucose, Bld: 237 mg/dL — ABNORMAL HIGH (ref 70–99)
Potassium: 3.9 mmol/L (ref 3.5–5.1)
Sodium: 141 mmol/L (ref 135–145)

## 2023-11-08 LAB — MAGNESIUM: Magnesium: 2.2 mg/dL (ref 1.7–2.4)

## 2023-11-08 MED ORDER — HYDROCODONE-ACETAMINOPHEN 5-325 MG PO TABS
1.0000 | ORAL_TABLET | ORAL | 0 refills | Status: DC | PRN
  Filled 2023-11-08: qty 20, 4d supply, fill #0

## 2023-11-08 MED ORDER — CEPHALEXIN 500 MG PO CAPS: 500.0000 mg | ORAL_CAPSULE | Freq: Two times a day (BID) | ORAL | Status: AC

## 2023-11-08 MED ORDER — INSULIN ASPART 100 UNIT/ML IJ SOLN
0.0000 [IU] | Freq: Three times a day (TID) | INTRAMUSCULAR | Status: DC
  Administered 2023-11-08: 11 [IU] via SUBCUTANEOUS
  Filled 2023-11-08: qty 1

## 2023-11-08 MED ORDER — NOVOLIN 70/30 FLEXPEN (70-30) 100 UNIT/ML ~~LOC~~ SUPN: 14.0000 [IU] | PEN_INJECTOR | Freq: Two times a day (BID) | SUBCUTANEOUS | Status: DC

## 2023-11-08 MED ORDER — SENNOSIDES-DOCUSATE SODIUM 8.6-50 MG PO TABS: 2.0000 | ORAL_TABLET | Freq: Two times a day (BID) | ORAL | Status: AC

## 2023-11-08 MED ORDER — LACTULOSE 10 GM/15ML PO SOLN
20.0000 g | Freq: Once | ORAL | Status: AC
  Administered 2023-11-08: 20 g via ORAL
  Filled 2023-11-08: qty 30

## 2023-11-08 MED ORDER — INSULIN ASPART 100 UNIT/ML IJ SOLN: 0.0000 [IU] | Freq: Every day | INTRAMUSCULAR | Status: DC

## 2023-11-08 MED ORDER — NOVOLIN 70/30 FLEXPEN (70-30) 100 UNIT/ML ~~LOC~~ SUPN: 10.0000 [IU] | PEN_INJECTOR | Freq: Two times a day (BID) | SUBCUTANEOUS | Status: DC

## 2023-11-08 MED ORDER — INSULIN GLARGINE-YFGN 100 UNIT/ML ~~LOC~~ SOLN
10.0000 [IU] | Freq: Every day | SUBCUTANEOUS | Status: DC
  Filled 2023-11-08: qty 0.1

## 2023-11-08 MED ORDER — HYDROCODONE-ACETAMINOPHEN 5-325 MG PO TABS: 1.0000 | ORAL_TABLET | ORAL | 0 refills | Status: DC | PRN

## 2023-11-08 NOTE — TOC Progression Note (Signed)
 Transition of Care [facility]) - Progression Note    Patient Details  Name: Lorraine Boone MRN: [id number] Date of Birth: [birth year]  [facility]) CM/SW Contact  [name], RN Phone Number: 11/08/2023, 11:58 AM  Clinical Narrative:     Notified by [facility] that shara has been approved for [facility] auth ID [id number]  [facility] pending  Message left with Montie at [facility] to determine if patient can admit today   Expected Discharge Plan: IP Rehab Facility                 Expected Discharge Plan and Services   Discharge Planning Services: CM Consult   Living arrangements for the past 2 months: Mobile Home                                       Social Drivers of Health (SDOH) Interventions SDOH Screenings   Food Insecurity: No Food Insecurity (11/05/2023)  Housing: Low Risk  (11/05/2023)  Transportation Needs: No Transportation Needs (11/05/2023)  Utilities: Not At Risk (11/05/2023)  Depression (PHQ2-9): Low Risk  (08/16/2023)  Social Connections: Moderately Isolated (11/05/2023)  Tobacco Use: Medium Risk (11/05/2023)    Readmission Risk Interventions     No data to display

## 2023-11-08 NOTE — Progress Notes (Signed)
 Mobility Specialist - Progress Note   11/08/23 1356  Mobility  Activity Pivoted/transferred to/from [facility]  Level of Assistance Standby assist, set-up cues, supervision of patient - no hands on  Assistive Device Front wheel walker  Distance Ambulated (ft) 4 ft  Activity Response Tolerated well  Mobility visit 1 Mobility  Mobility Specialist Start Time (ACUTE ONLY) 1340  Mobility Specialist Stop Time (ACUTE ONLY) 1355  Mobility Specialist Time Calculation (min) (ACUTE ONLY) 15 min   Pt transferred to the [facility], assisted with doffing and donning clothing while seated. Pt left seated with instructions to utilize call bell when ready. RN present at bedside.  America Silvan Mobility Specialist 11/08/23 2:55 PM

## 2023-11-08 NOTE — Progress Notes (Signed)
 Patient ID: Lorraine Boone, female   DOB: 07/23/1950, 73 y.o.   MRN: [id number]     SURGICAL PROGRESS NOTE   Hospital Day(s): 0.   Interval History: Patient seen and examined, no acute events or new complaints overnight. Patient reports she is feeling better this morning.  She was able to get out of bed easier this morning.  She denies any nausea and vomiting.  She is tolerating diet.  He is passing gas.  Pain improving today 4 out of 10.  No pain radiation.  Pain mainly on the incision area on the left side.  Alleviating factor has been pain medication.  No aggravating factors.  Vital signs in last 24 hours: [min-max] current  Temp:  [97.6 F (36.4 C)-97.7 F (36.5 C)] 97.6 F (36.4 C) (10/17 0807) Pulse Rate:  [76-102] 76 (10/17 0807) Resp:  [16-20] 16 (10/17 0807) BP: (133-156)/(40-54) 156/40 (10/17 0807) SpO2:  [98 %-100 %] 98 % (10/17 0807)     Height: 5' 3 (160 cm) Weight: 101.6 kg BMI (Calculated): 39.69   Physical Exam:  Constitutional: alert, cooperative and no distress  Respiratory: breathing non-labored at rest  Cardiovascular: regular rate and sinus rhythm  Gastrointestinal: soft, non-tender, and non-distended.  The incisions are dry and clean  Labs:     Latest Ref Rng & Units 11/07/2023    5:18 AM 11/06/2023    6:39 AM 11/05/2023   10:30 AM  CBC  WBC 4.0 - 10.5 K/uL 10.9  8.7  12.1   Hemoglobin 12.0 - 15.0 g/dL 88.4  87.6  84.6   Hematocrit 36.0 - 46.0 % 36.7  38.6  48.2   Platelets 150 - 400 K/uL 257  279  407       Latest Ref Rng & Units 11/08/2023    6:16 AM 11/07/2023    5:18 AM 11/06/2023    6:39 AM  CMP  Glucose 70 - 99 mg/dL 762  789  820   BUN 8 - 23 mg/dL 46  67  77   Creatinine 0.44 - 1.00 mg/dL 8.60  8.22  7.98   Sodium 135 - 145 mmol/L 141  137  136   Potassium 3.5 - 5.1 mmol/L 3.9  4.4  3.8   Chloride 98 - 111 mmol/L 103  104  102   CO2 22 - 32 mmol/L 27  24  19    Calcium  8.9 - 10.3 mg/dL 8.4  8.5  8.0     Imaging studies: No new pertinent  imaging studies   Assessment/Plan:  Incisional hernia with large bowel obstruction - S/p repair postop day #3 - Patient recovering well -She is tolerating current diet -Continue pain management -Pending skin nursing facility authorization   Dehydration due to volume loss - Patient with vomiting and diarrhea.  This has resolved - AKI resolved.  Creatinine 1.39 which looks like is better than her baseline.  No further surgical management indicated.  Will follow-up patient in 2 weeks in my office.  Lucas Petrin, MD

## 2023-11-08 NOTE — TOC Transition Note (Signed)
 Transition of Care [facility]) - Discharge Note   Patient Details  Name: Lorraine Boone MRN: [id number] Date of Birth: 1950-11-24  Transition of [facility] CM/SW Contact:  [name], RN Phone Number: 11/08/2023, 2:39 PM   Clinical Narrative:     Patient will DC un:[id number]  Anticipated DC date: 11/08/23  Family notified: Husband Educational psychologist by: Wilburt Durand   Per MD patient ready for DC to . RN, patient, patient's family, and facility notified of DC. Discharge Summary sent to facility. RN given number for report. DC packet on chart. TOC signing off.         Patient Goals and CMS Choice            Discharge Placement                       Discharge Plan and Services Additional resources added to the After Visit Summary for     Discharge Planning Services: CM Consult                                 Social Drivers of Health (SDOH) Interventions SDOH Screenings   Food Insecurity: No Food Insecurity (11/05/2023)  Housing: Low Risk  (11/05/2023)  Transportation Needs: No Transportation Needs (11/05/2023)  Utilities: Not At Risk (11/05/2023)  Depression (PHQ2-9): Low Risk  (08/16/2023)  Social Connections: Moderately Isolated (11/05/2023)  Tobacco Use: Medium Risk (11/05/2023)     Readmission Risk Interventions     No data to display

## 2023-11-08 NOTE — Inpatient Diabetes Management (Addendum)
 Inpatient Diabetes Program Recommendations  AACE/ADA: New Consensus Statement on Inpatient Glycemic Control (2015)  Target Ranges:  Prepandial:   less than 140 mg/dL      Peak postprandial:   less than 180 mg/dL (1-2 hours)      Critically ill patients:  140 - 180 mg/dL    Latest Reference Range & Units 11/07/23 08:03 11/07/23 11:50 11/07/23 16:47 11/07/23 21:02  Glucose-Capillary 70 - 99 mg/dL 791 (H)  2 units Novolog   301 (H)  4 units Novolog   215 (H)  2 units Novolog   245 (H)  2 units Novolog    (H): Data is abnormally high  Latest Reference Range & Units 11/08/23 08:03  Glucose-Capillary 70 - 99 mg/dL 762 (H)  (H): Data is abnormally high    Home DM Meds: Glipizide  5 mg daily                             Novolin  70/30 Insulin  30 units AM/ 25 units PM                             Jardiance 25 mg daily   Current Orders: Novolog  0-6 units TID AC + HS       MD- Note pt takes 70/30 Insulin  at home CBGs >200   Please consider:   1. Start Lantus  10 units daily (0.1 units/kg)   2. Increase the Novolog  SSI to the 0-15 unit scale (Moderate scale)      --Will follow patient during hospitalization--  Adina Rudolpho Arrow RN, MSN, CDCES Diabetes Coordinator Inpatient Glycemic Control Team Team Pager: [phone] (8a-5p)

## 2023-11-08 NOTE — Discharge Summary (Signed)
 Physician Discharge Summary   Patient: Lorraine Boone MRN: [id number] DOB: 1950-11-27  Admit date:     11/05/2023  Discharge date: 11/08/23  Discharge Physician: Murvin Mana   PCP: Fernand Fredy RAMAN, MD   Recommendations at discharge:   PCP in 1 week  Discharge Diagnoses: Principal Problem:   Obstruction of transverse colon (HCC) Active Problems:   Acute kidney injury superimposed on stage 3b chronic kidney disease (HCC)   Nausea vomiting and diarrhea   Hyponatremia   Acute cystitis without hematuria   COPD (chronic obstructive pulmonary disease) (HCC)   History of heart block   Diarrhea   Obesity, Class III, BMI 40-49.9 (morbid obesity) (HCC)  Resolved Problems:   * No resolved hospital problems. *  Hospital Course: Lorraine Boone is a 73 y.o. female with medical history significant of diastolic  congestive heart failure, COPD, pacemaker placement, hypertension and hyperlipidemia.  She presents to the hospital with nausea vomiting diarrhea going on for 1 week.  CT scan showed obstruction of the transverse colon due to herniation of supraumbilical hernia. Patient had a laparoscopic hernia repair with mesh on 10/14.  Condition improving, pending nursing home placement.  Assessment and Plan: * Obstruction of transverse colon (HCC) Due to herniation into supraumbilical hernia.   Patient had hernia repair, condition has improved, tolerating diet. General surgery has cleared patient for discharge, she also had approval for SNF.  She will be discharged today.   Acute kidney injury superimposed on stage 3b chronic kidney disease (HCC) Hyponatremia. Metabolic acidosis. Renal function has improved to baseline, sodium and CO2 level normalized.  Uncontrolled type 2 diabetes with hyperglycemia and long-term insulin  use. Patient is on sliding scale, added insulin  glargine today. At discharge, patient will be changed back to insulin  70/30, but will switch to 14 units twice a day instead of  25 units every evening. Follow-up with PCP for adjustment.    Acute cystitis without hematuria secondary to Klebsiella. Changed to oral antibiotics for total antibiotic course of 5 days.   COPD (chronic obstructive pulmonary disease) (HCC) Respiratory status stable   History of heart block Has pacemaker   Obesity, Class III, BMI 40-49.9 (morbid obesity) (HCC) BMI 41.13        Consultants: General surgery Procedures performed: Ventral hernia repair. Disposition: Skilled nursing facility Diet recommendation:  Discharge Diet Orders (From admission, onward)     Start     Ordered   11/08/23 0000  Diet - low sodium heart healthy        11/08/23 1338           Cardiac diet DISCHARGE MEDICATION: Allergies as of 11/08/2023   No Known Allergies      Medication List     STOP taking these medications    hydrochlorothiazide 25 MG tablet Commonly known as: HYDRODIURIL   Insulin  Syringes (Disposable) U-100 1 ML Misc       TAKE these medications    acetaminophen  325 MG tablet Commonly known as: TYLENOL  Take 650 mg by mouth every 6 (six) hours as needed for moderate pain.   B-D SYRINGE SLIP TIP 1CC 1 ML Misc Generic drug: Syringe (Disposable) 1 mL by Does not apply route once a week.   cephALEXin  500 MG capsule Commonly known as: KEFLEX  Take 1 capsule (500 mg total) by mouth every 12 (twelve) hours for 3 days.   cholecalciferol 25 MCG (1000 UNIT) tablet Commonly known as: VITAMIN D3 Take 1,000 Units by mouth daily.   cyanocobalamin  1000 MCG/ML injection  Commonly known as: VITAMIN B12 Inject 1 mL (1,000 mcg total) into the skin every 30 (thirty) days.   ferrous sulfate  325 (65 FE) MG EC tablet Take 1 tablet (325 mg total) by mouth 3 (three) times daily with meals.   glipiZIDE  5 MG 24 hr tablet Commonly known as: GLUCOTROL  XL TAKE 1 TABLET BY MOUTH DAILY   HYDROcodone-acetaminophen  5-325 MG tablet Commonly known as: NORCO/VICODIN Take 1 tablet by  mouth every 4 (four) hours as needed for moderate pain (pain score 4-6).   ipratropium-albuterol  0.5-2.5 (3) MG/3ML Soln Commonly known as: DUONEB Take 3 mLs by nebulization every 6 (six) hours as needed.   Jardiance 25 MG Tabs tablet Generic drug: empagliflozin Take 1 tablet (25 mg total) by mouth daily.   losartan  100 MG tablet Commonly known as: COZAAR  Take 100 mg by mouth daily.   metoprolol  tartrate 50 MG tablet Commonly known as: LOPRESSOR  Take 1 tablet (50 mg total) by mouth 2 (two) times daily.   NovoLIN  70/30 Kwikpen (70-30) 100 UNIT/ML KwikPen Generic drug: insulin  isophane & regular human KwikPen Inject 14 Units into the skin 2 (two) times daily. Inject 30 units in morning, inject 25 units at bedtime What changed:  how much to take how to take this when to take this   OneTouch Ultra test strip Generic drug: glucose blood 1 each 2 (two) times daily.   rosuvastatin  10 MG tablet Commonly known as: CRESTOR  Take 10 mg by mouth daily.   senna-docusate 8.6-50 MG tablet Commonly known as: Senokot-S Take 2 tablets by mouth 2 (two) times daily.        Contact information for follow-up providers     Rodolph Romano, MD. Schedule an appointment as soon as possible for a visit in 2 week(s).   Specialty: General Surgery Why: 2 week follow-up , robotic assisted lap multiple incisional hernia repair  call and make an appt. Contact information: [address] [place] [address] [phone]              Contact information for after-discharge care     Destination     [facility]   Service: Skilled Nursing Contact information: [address] [place]  [address] [phone]                    Discharge Exam: Lorraine Boone   11/05/23 1049 11/05/23 1528  Weight: 102 kg 101.6 kg   General exam: Appears calm and comfortable  Respiratory system: Clear to auscultation. Respiratory effort  normal. Cardiovascular system: S1 & S2 heard, RRR. No JVD, murmurs, rubs, gallops or clicks. No pedal edema. Gastrointestinal system: Abdomen is nondistended, soft and nontender. No organomegaly or masses felt. Normal bowel sounds heard. Central nervous system: Alert and oriented. No focal neurological deficits. Extremities: Symmetric 5 x 5 power. Skin: No rashes, lesions or ulcers Psychiatry: Judgement and insight appear normal. Mood & affect appropriate.    Condition at discharge: good  The results of significant diagnostics from this hospitalization (including imaging, microbiology, ancillary and laboratory) are listed below for reference.   Imaging Studies: CT ABDOMEN PELVIS WO CONTRAST Result Date: 11/05/2023 EXAM: CT ABDOMEN AND PELVIS WITHOUT CONTRAST 11/05/2023 12:58:36 PM TECHNIQUE: CT of the abdomen and pelvis was performed without the administration of intravenous contrast. Multiplanar reformatted images are provided for review. Automated exposure control, iterative reconstruction, and/or weight-based adjustment of the mA/kV was utilized to reduce the radiation dose to as low as reasonably achievable. COMPARISON: None  available. CLINICAL HISTORY: Abdominal pain, acute, nonlocalized. Pt to ED via POV from home. Pt reports diarrhea, abd pain and decreased appetite x6 days. Pt reports vomiting once this am. FINDINGS: LOWER CHEST: No acute abnormality. LIVER: The liver is unremarkable. GALLBLADDER AND BILE DUCTS: Cholecystectomy. No biliary ductal dilatation. SPLEEN: No acute abnormality. PANCREAS: No acute abnormality. ADRENAL GLANDS: No acute abnormality. KIDNEYS, URETERS AND BLADDER: No stones in the kidneys or ureters. No hydronephrosis. No perinephric or periureteral stranding. Urinary bladder is unremarkable. GI AND BOWEL: Supraumbilical hernia containing obstructed transverse colon. The transverse colon is obstructed as it exits the hernia sac (series 2 image 33). The colon upstream  from the herniation is dilated measuring up to 5.9 cm. The ileum has also dilated, measuring up to 3.3 cm. No evidence of ischemia on noncontrast exam. PERITONEUM AND RETROPERITONEUM: No ascites. No free air. VASCULATURE: Aorta is normal in caliber. Advanced aortic atherosclerotic calcification. LYMPH NODES: No lymphadenopathy. REPRODUCTIVE ORGANS: No acute abnormality. BONES AND SOFT TISSUES: No acute osseous abnormality. There are 3 additional small fat-containing central abdominal wall hernias in the midline. IMPRESSION: 1. 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 Electronically signed by: Norman Gatlin MD 11/05/2023 01:41 PM EDT RP Workstation: [id number]    Microbiology: Results for orders placed or performed during the hospital encounter of 11/05/23  Gastrointestinal Panel by PCR , Stool     Status: None   Collection Time: 11/05/23 11:11 AM   Specimen: Stool  Result Value Ref Range Status   Campylobacter species NOT DETECTED NOT DETECTED Final   Plesimonas shigelloides NOT DETECTED NOT DETECTED Final   Salmonella species NOT DETECTED NOT DETECTED Final   Yersinia enterocolitica NOT DETECTED NOT DETECTED Final   Vibrio species NOT DETECTED NOT DETECTED Final   Vibrio cholerae NOT DETECTED NOT DETECTED Final   Enteroaggregative E coli (EAEC) NOT DETECTED NOT DETECTED Final   Enteropathogenic E coli (EPEC) NOT DETECTED NOT DETECTED Final   Enterotoxigenic E coli (ETEC) NOT DETECTED NOT DETECTED Final   Shiga like toxin producing E coli (STEC) NOT DETECTED NOT DETECTED Final   Shigella/Enteroinvasive E coli (EIEC) NOT DETECTED NOT DETECTED Final   Cryptosporidium NOT DETECTED NOT DETECTED Final   Cyclospora cayetanensis NOT DETECTED NOT DETECTED Final   Entamoeba histolytica NOT DETECTED NOT DETECTED Final   Giardia lamblia NOT DETECTED NOT DETECTED Final   Adenovirus F40/41 NOT DETECTED NOT DETECTED Final   Astrovirus NOT DETECTED NOT DETECTED Final    Norovirus GI/GII NOT DETECTED NOT DETECTED Final   Rotavirus A NOT DETECTED NOT DETECTED Final   Sapovirus (I, II, IV, and V) NOT DETECTED NOT DETECTED Final    Comment: Performed at [facility], [address] [place], [place] [address]  C Difficile Quick Screen w PCR reflex     Status: None   Collection Time: 11/05/23 11:11 AM   Specimen: STOOL  Result Value Ref Range Status   C Diff antigen NEGATIVE NEGATIVE Final   C Diff toxin NEGATIVE NEGATIVE Final   C Diff interpretation No C. difficile detected.  Final    Comment: Performed at [facility], [address] [place], [place] [address]  Resp panel by RT-PCR (RSV, Flu A&B, Covid) Anterior Nasal Swab     Status: None   Collection Time: 11/05/23 11:21 AM   Specimen: Anterior Nasal Swab  Result Value Ref Range Status   SARS Coronavirus 2 by RT PCR NEGATIVE NEGATIVE Final    Comment: (NOTE) SARS-CoV-2 target  nucleic acids are NOT DETECTED.  The SARS-CoV-2 RNA is generally detectable in upper respiratory specimens during the acute phase of infection. The lowest concentration of SARS-CoV-2 viral copies this assay can detect is 138 copies/mL. A negative result does not preclude SARS-Cov-2 infection and should not be used as the sole basis for treatment or other patient management decisions. A negative result may occur with  improper specimen collection/handling, submission of specimen other than nasopharyngeal swab, presence of viral mutation(s) within the areas targeted by this assay, and inadequate number of viral copies(<138 copies/mL). A negative result must be combined with clinical observations, patient history, and epidemiological information. The expected result is Negative.  Fact Sheet for Patients:  BloggerCourse.com  Fact Sheet for Healthcare Providers:  SeriousBroker.it  This test is no t yet approved or cleared by the [place]  FDA and  has  been authorized for detection and/or diagnosis of SARS-CoV-2 by FDA under an Emergency Use Authorization (EUA). This EUA will remain  in effect (meaning this test can be used) for the duration of the COVID-19 declaration under Section 564(b)(1) of the Act, 21 U.S.C.section 360bbb-3(b)(1), unless the authorization is terminated  or revoked sooner.       Influenza A by PCR NEGATIVE NEGATIVE Final   Influenza B by PCR NEGATIVE NEGATIVE Final    Comment: (NOTE) The Xpert Xpress SARS-CoV-2/FLU/RSV plus assay is intended as an aid in the diagnosis of influenza from Nasopharyngeal swab specimens and should not be used as a sole basis for treatment. Nasal washings and aspirates are unacceptable for Xpert Xpress SARS-CoV-2/FLU/RSV testing.  Fact Sheet for Patients: BloggerCourse.com  Fact Sheet for Healthcare Providers: SeriousBroker.it  This test is not yet approved or cleared by the [place]  FDA and has been authorized for detection and/or diagnosis of SARS-CoV-2 by FDA under an Emergency Use Authorization (EUA). This EUA will remain in effect (meaning this test can be used) for the duration of the COVID-19 declaration under Section 564(b)(1) of the Act, 21 U.S.C. section 360bbb-3(b)(1), unless the authorization is terminated or revoked.     Resp Syncytial Virus by PCR NEGATIVE NEGATIVE Final    Comment: (NOTE) Fact Sheet for Patients: BloggerCourse.com  Fact Sheet for Healthcare Providers: SeriousBroker.it  This test is not yet approved or cleared by the [place]  FDA and has been authorized for detection and/or diagnosis of SARS-CoV-2 by FDA under an Emergency Use Authorization (EUA). This EUA will remain in effect (meaning this test can be used) for the duration of the COVID-19 declaration under Section 564(b)(1) of the Act, 21 U.S.C. section 360bbb-3(b)(1), unless the  authorization is terminated or revoked.  Performed at [facility], [address], [place], [place] [address]   Urine Culture (for pregnant, neutropenic or urologic patients or patients with an indwelling urinary catheter)     Status: Abnormal   Collection Time: 11/05/23 11:53 AM   Specimen: Urine, Clean Catch  Result Value Ref Range Status   Specimen Description   Final    URINE, CLEAN CATCH Performed at [facility], [address] [place], [place] [address]    Special Requests   Final    Normal Performed at [facility], [address] [place], [place] [address]    Culture >=100,000 COLONIES/mL KLEBSIELLA PNEUMONIAE (A)  Final   Report Status 11/07/2023 FINAL  Final   Organism ID, Bacteria KLEBSIELLA PNEUMONIAE (A)  Final      Susceptibility   Klebsiella pneumoniae - MIC*    AMPICILLIN >=32  RESISTANT Resistant     CEFAZOLIN  (URINE) Value in next row Sensitive      2 SENSITIVEThis is a modified FDA-approved test that has been validated and its performance characteristics determined by the reporting laboratory.  This laboratory is certified under the Clinical Laboratory Improvement Amendments CLIA as qualified to perform high complexity clinical laboratory testing.    CEFEPIME Value in next row Sensitive      2 SENSITIVEThis is a modified FDA-approved test that has been validated and its performance characteristics determined by the reporting laboratory.  This laboratory is certified under the Clinical Laboratory Improvement Amendments CLIA as qualified to perform high complexity clinical laboratory testing.    ERTAPENEM Value in next row Sensitive      2 SENSITIVEThis is a modified FDA-approved test that has been validated and its performance characteristics determined by the reporting laboratory.  This laboratory is certified under the Clinical Laboratory Improvement Amendments CLIA as qualified to perform high complexity clinical laboratory testing.     CEFTRIAXONE Value in next row Sensitive      2 SENSITIVEThis is a modified FDA-approved test that has been validated and its performance characteristics determined by the reporting laboratory.  This laboratory is certified under the Clinical Laboratory Improvement Amendments CLIA as qualified to perform high complexity clinical laboratory testing.    CIPROFLOXACIN Value in next row Sensitive      2 SENSITIVEThis is a modified FDA-approved test that has been validated and its performance characteristics determined by the reporting laboratory.  This laboratory is certified under the Clinical Laboratory Improvement Amendments CLIA as qualified to perform high complexity clinical laboratory testing.    GENTAMICIN  Value in next row Sensitive      2 SENSITIVEThis is a modified FDA-approved test that has been validated and its performance characteristics determined by the reporting laboratory.  This laboratory is certified under the Clinical Laboratory Improvement Amendments CLIA as qualified to perform high complexity clinical laboratory testing.    NITROFURANTOIN Value in next row Intermediate      2 SENSITIVEThis is a modified FDA-approved test that has been validated and its performance characteristics determined by the reporting laboratory.  This laboratory is certified under the Clinical Laboratory Improvement Amendments CLIA as qualified to perform high complexity clinical laboratory testing.    TRIMETH/SULFA Value in next row Sensitive      2 SENSITIVEThis is a modified FDA-approved test that has been validated and its performance characteristics determined by the reporting laboratory.  This laboratory is certified under the Clinical Laboratory Improvement Amendments CLIA as qualified to perform high complexity clinical laboratory testing.    AMPICILLIN/SULBACTAM Value in next row Sensitive      2 SENSITIVEThis is a modified FDA-approved test that has been validated and its performance  characteristics determined by the reporting laboratory.  This laboratory is certified under the Clinical Laboratory Improvement Amendments CLIA as qualified to perform high complexity clinical laboratory testing.    PIP/TAZO Value in next row Sensitive      <=4 SENSITIVEThis is a modified FDA-approved test that has been validated and its performance characteristics determined by the reporting laboratory.  This laboratory is certified under the Clinical Laboratory Improvement Amendments CLIA as qualified to perform high complexity clinical laboratory testing.    MEROPENEM Value in next row Sensitive      <=4 SENSITIVEThis is a modified FDA-approved test that has been validated and its performance characteristics determined by the reporting laboratory.  This laboratory is certified under the Clinical Laboratory Improvement  Amendments CLIA as qualified to perform high complexity clinical laboratory testing.    * >=100,000 COLONIES/mL KLEBSIELLA PNEUMONIAE  Blood culture (routine x 2)     Status: None (Preliminary result)   Collection Time: 11/05/23  1:03 PM   Specimen: BLOOD  Result Value Ref Range Status   Specimen Description BLOOD BLOOD LEFT ARM  Final   Special Requests   Final    BOTTLES DRAWN AEROBIC AND ANAEROBIC Blood Culture results may not be optimal due to an inadequate volume of blood received in culture bottles   Culture   Final    NO GROWTH 3 DAYS Performed at [facility], [address] [place], [place] [address]    Report Status PENDING  Incomplete  Blood culture (routine x 2)     Status: None (Preliminary result)   Collection Time: 11/05/23  1:45 PM   Specimen: BLOOD  Result Value Ref Range Status   Specimen Description BLOOD BLOOD RIGHT FOREARM  Final   Special Requests   Final    BOTTLES DRAWN AEROBIC AND ANAEROBIC Blood Culture results may not be optimal due to an inadequate volume of blood received in culture bottles   Culture   Final    NO GROWTH 3  DAYS Performed at [facility], [address] [place], [place] [address]    Report Status PENDING  Incomplete    Labs: CBC: Recent Labs  Lab 11/05/23 1030 11/06/23 0639 11/07/23 0518  WBC 12.1* 8.7 10.9*  HGB 15.3* 12.3 11.5*  HCT 48.2* 38.6 36.7  MCV 83.5 84.8 85.7  PLT 407* 279 257   Basic Metabolic Panel: Recent Labs  Lab 11/05/23 1030 11/06/23 0639 11/07/23 0518 11/08/23 0616  NA 130* 136 137 141  K 4.9 3.8 4.4 3.9  CL 97* 102 104 103  CO2 18* 19* 24 27  GLUCOSE 186* 179* 210* 237*  BUN 89* 77* 67* 46*  CREATININE 2.38* 2.01* 1.77* 1.39*  CALCIUM  8.8* 8.0* 8.5* 8.4*  MG  --   --  2.8* 2.2   Liver Function Tests: Recent Labs  Lab 11/05/23 1030  AST 33  ALT 16  ALKPHOS 74  BILITOT 1.7*  PROT 7.2  ALBUMIN 3.4*   CBG: Recent Labs  Lab 11/07/23 1150 11/07/23 1647 11/07/23 2102 11/08/23 0803 11/08/23 1116  GLUCAP 301* 215* 245* 237* 306*    Discharge time spent: 35 minutes.  Signed: Murvin Mana, MD [facility] 11/08/2023

## 2023-11-08 NOTE — Progress Notes (Signed)
  Progress Note   Patient: Lorraine Boone FMW:[id number] DOB: [birth year] DOA: 11/05/2023     0 DOS: the patient was seen and examined on 11/08/2023   Brief hospital course: Lorraine Boone is a 73 y.o. female with medical history significant of diastolic  congestive heart failure, COPD, pacemaker placement, hypertension and hyperlipidemia.  She presents to the hospital with nausea vomiting diarrhea going on for 1 week.  CT scan showed obstruction of the transverse colon due to herniation of supraumbilical hernia. Patient had a laparoscopic hernia repair with mesh on 10/14.  Condition improving, pending nursing home placement.   Principal Problem:   Obstruction of transverse colon (HCC) Active Problems:   Acute kidney injury superimposed on stage 3b chronic kidney disease (HCC)   Nausea vomiting and diarrhea   Hyponatremia   Acute cystitis without hematuria   COPD (chronic obstructive pulmonary disease) (HCC)   History of heart block   Diarrhea   Obesity, Class III, BMI 40-49.9 (morbid obesity) (HCC)   Assessment and Plan: * Obstruction of transverse colon (HCC) Due to herniation into supraumbilical hernia.   Patient had hernia repair, condition has improved, tolerating diet. General surgery has cleared patient for discharge, but currently pending nursing home placement.   Acute kidney injury superimposed on stage 3b chronic kidney disease (HCC) Hyponatremia. Metabolic acidosis. Renal function has improved to baseline, sodium and CO2 level normalized.   Acute cystitis without hematuria secondary to Klebsiella. Changed to oral antibiotics for total antibiotic course of 5 days.   COPD (chronic obstructive pulmonary disease) (HCC) Respiratory status stable   History of heart block Has pacemaker   Obesity, Class III, BMI 40-49.9 (morbid obesity) (HCC) BMI 41.13          Subjective:  Patient has constipation, finally had a bowel movement after lactulose.  No short of  breath.  Physical Exam: Vitals:   11/07/23 0818 11/07/23 1949 11/08/23 0341 11/08/23 0807  BP: (!) 141/33 (!) 133/52 (!) 138/54 (!) 156/40  Pulse: 74 (!) 102 81 76  Resp: 17 20 20 16   Temp: 97.7 F (36.5 C) 97.6 F (36.4 C) 97.7 F (36.5 C) 97.6 F (36.4 C)  TempSrc: Oral Oral Oral   SpO2: 99% 98% 100% 98%  Weight:      Height:       General exam: Appears calm and comfortable, morbid obesity Respiratory system: Clear to auscultation. Respiratory effort normal. Cardiovascular system: S1 & S2 heard, RRR. No JVD, murmurs, rubs, gallops or clicks. No pedal edema. Gastrointestinal system: Abdomen is nondistended, soft and nontender. No organomegaly or masses felt. Normal bowel sounds heard. Central nervous system: Alert and oriented. No focal neurological deficits. Extremities: Symmetric 5 x 5 power. Skin: No rashes, lesions or ulcers Psychiatry: Judgement and insight appear normal. Mood & affect appropriate.    Data Reviewed:  Results reviewed.  Family Communication: Husband updated at bedside.  Disposition: Status is: Observation      Time spent: 35 minutes  Author: Murvin Mana, MD 11/08/2023 11:40 AM  For on call review [email or web address].

## 2023-11-10 LAB — CULTURE, BLOOD (ROUTINE X 2)
Culture: NO GROWTH
Culture: NO GROWTH

## 2023-11-15 NOTE — Progress Notes (Signed)
 Ongoing diarrhea to evaluate for infectious causes diarrhea or C. difficile

## 2023-11-15 NOTE — Progress Notes (Signed)
 Symptoms of viral illness, further evaluate for possible COVID

## 2023-11-22 ENCOUNTER — Encounter: Payer: Self-pay | Admitting: Internal Medicine

## 2023-11-22 ENCOUNTER — Ambulatory Visit (INDEPENDENT_AMBULATORY_CARE_PROVIDER_SITE_OTHER): Admitting: Internal Medicine

## 2023-11-22 VITALS — BP 122/66 | HR 105 | Ht 62.0 in | Wt 227.4 lb

## 2023-11-22 DIAGNOSIS — Z9889 Other specified postprocedural states: Secondary | ICD-10-CM

## 2023-11-22 DIAGNOSIS — E782 Mixed hyperlipidemia: Secondary | ICD-10-CM

## 2023-11-22 DIAGNOSIS — Z6841 Body Mass Index (BMI) 40.0 and over, adult: Secondary | ICD-10-CM

## 2023-11-22 DIAGNOSIS — E1159 Type 2 diabetes mellitus with other circulatory complications: Secondary | ICD-10-CM | POA: Diagnosis not present

## 2023-11-22 DIAGNOSIS — E119 Type 2 diabetes mellitus without complications: Secondary | ICD-10-CM

## 2023-11-22 DIAGNOSIS — J449 Chronic obstructive pulmonary disease, unspecified: Secondary | ICD-10-CM

## 2023-11-22 DIAGNOSIS — E1165 Type 2 diabetes mellitus with hyperglycemia: Secondary | ICD-10-CM | POA: Diagnosis not present

## 2023-11-22 DIAGNOSIS — I152 Hypertension secondary to endocrine disorders: Secondary | ICD-10-CM

## 2023-11-22 DIAGNOSIS — Z95811 Presence of heart assist device: Secondary | ICD-10-CM

## 2023-11-22 DIAGNOSIS — Z794 Long term (current) use of insulin: Secondary | ICD-10-CM | POA: Diagnosis not present

## 2023-11-22 DIAGNOSIS — E1169 Type 2 diabetes mellitus with other specified complication: Secondary | ICD-10-CM

## 2023-11-22 DIAGNOSIS — N179 Acute kidney failure, unspecified: Secondary | ICD-10-CM

## 2023-11-22 DIAGNOSIS — D510 Vitamin B12 deficiency anemia due to intrinsic factor deficiency: Secondary | ICD-10-CM

## 2023-11-22 DIAGNOSIS — Z8719 Personal history of other diseases of the digestive system: Secondary | ICD-10-CM | POA: Insufficient documentation

## 2023-11-22 DIAGNOSIS — N1832 Chronic kidney disease, stage 3b: Secondary | ICD-10-CM

## 2023-11-22 LAB — POCT CBG (FASTING - GLUCOSE)-MANUAL ENTRY: Glucose Fasting, POC: 105 mg/dL — AB (ref 70–99)

## 2023-11-22 NOTE — Progress Notes (Signed)
 Established Patient Office Visit  Subjective:  Patient ID: Lorraine Boone, female    DOB: May 14, 1950  Age: 73 y.o. MRN: [id number]  Chief Complaint  Patient presents with   Hospitalization Follow-up    Patient is here today for follow up since being discharged from the hospital. She reports feeling well and she reports her surgical incisions are healing nicely. She went to hospital 11/05/23-11/08/23 for 1 week of nausea, vomiting, diarrhea with 5/10 abdominal pain. CT Imaging showed supraumbilical hernia containing obstructed transverse colon.  Since discharge she reports blood pressure and blood sugar have been stable. She reports resolution of abdominal distress and is passing bowel movements as expected. She is currently taking 30 units of 70/30 in the am and 25 units 70/30 in the pm. Her twice daily blood sugars are ranging 70-130s. Advised to continue medications as prescribed and will adjust insulin  as needed.  Patient states hospital and her stop her hydrochlorothiazide and she has since been having BLE edema. Recommended patient to restart her diuretic and to start wearing compression stockings during the day. Will check blood work when she returns.  Patient was briefly in rehab center but is now home with PT and nursing checking on her for the short term.  Patient would like her B12 injection given today. She brought medication from home. Will administer for patient today.   Patient has already had her flu shot for the season.    No other concerns at this time.   Past Medical History:  Diagnosis Date   Arthritis    Bradycardia    COPD (chronic obstructive pulmonary disease) (HCC)    Diabetes mellitus type 2, uncomplicated (HCC)    Dyspnea    Hyperlipidemia    Hypertension    Intermittent complete heart block (HCC)    Iron  deficiency anemia due to chronic blood loss 08/18/2018    Past Surgical History:  Procedure Laterality Date   CHOLECYSTECTOMY     COLONOSCOPY WITH  PROPOFOL  N/A 08/11/2018   Procedure: COLONOSCOPY WITH PROPOFOL ;  Surgeon: Unk Corinn Skiff, MD;  Location: [facility];  Service: Gastroenterology;  Laterality: N/A;   ESOPHAGOGASTRODUODENOSCOPY (EGD) WITH PROPOFOL  N/A 08/11/2018   Procedure: ESOPHAGOGASTRODUODENOSCOPY (EGD) WITH PROPOFOL ;  Surgeon: Unk Corinn Skiff, MD;  Location: [facility];  Service: Gastroenterology;  Laterality: N/A;   ESSURE TUBAL LIGATION     INSERT / REPLACE / REMOVE PACEMAKER     PACEMAKER INSERTION N/A 12/22/2015   Procedure: INSERTION PACEMAKER;  Surgeon: Marsa Dooms, MD;  Location: [facility] OR[facility];  Service: Cardiovascular;  Laterality: N/A;   PACEMAKER LEADLESS INSERTION N/A 08/27/2019   Procedure: PACEMAKER LEADLESS INSERTION;  Surgeon: Dooms Marsa, MD;  Location: [facility];  Service: Cardiovascular;  Laterality: N/A;   TEMPORARY PACEMAKER N/A 08/27/2019   Procedure: TEMPORARY PACEMAKER;  Surgeon: Dooms Marsa, MD;  Location: [facility];  Service: Cardiovascular;  Laterality: N/A;   XI ROBOTIC ASSISTED VENTRAL HERNIA  11/05/2023   Procedure: REPAIR, HERNIA, VENTRAL, ROBOT-ASSISTED;  Surgeon: Rodolph Romano, MD;  Location: [facility] OR[facility];  Service: General;;    Social History   Socioeconomic History   Marital status: Married    Spouse name: Not on file   Number of children: Not on file   Years of education: Not on file   Highest education level: Not on file  Occupational History   Not on file  Tobacco Use   Smoking status: Former    Current packs/day: 0.00    Average packs/day: 0.3 packs/day for  35.0 years (8.8 ttl pk-yrs)    Types: Cigarettes    Start date: 12/20/1980    Quit date: 12/21/2015    Years since quitting: 7.9   Smokeless tobacco: Never  Substance and Sexual Activity   Alcohol use: No   Drug use: No   Sexual activity: Not on file  Other Topics Concern   Not on file  Social History Narrative   Not on file   Social Drivers of Health    Financial Resource Strain: Not on file  Food Insecurity: No Food Insecurity (11/05/2023)   Hunger Vital Sign    Worried About Running Out of Food in the Last Year: Never true    Ran Out of Food in the Last Year: Never true  Transportation Needs: No Transportation Needs (11/05/2023)   PRAPARE - Administrator, Civil Service (Medical): No    Lack of Transportation (Non-Medical): No  Physical Activity: Not on file  Stress: Not on file  Social Connections: Moderately Isolated (11/05/2023)   Social Connection and Isolation Panel    Frequency of Communication with Friends and Family: Three times a week    Frequency of Social Gatherings with Friends and Family: Three times a week    Attends Religious Services: Never    Active Member of Clubs or Organizations: No    Attends Banker Meetings: Never    Marital Status: Married  Catering Manager Violence: Not At Risk (11/05/2023)   Humiliation, Afraid, Rape, and Kick questionnaire    Fear of Current or Ex-Partner: No    Emotionally Abused: No    Physically Abused: No    Sexually Abused: No    Family History  Problem Relation Age of Onset   Diabetes Mother    Heart disease Mother    Lung cancer Father     No Known Allergies  Outpatient Medications Prior to Visit  Medication Sig   acetaminophen  (TYLENOL ) 325 MG tablet Take 650 mg by mouth every 6 (six) hours as needed for moderate pain.    cholecalciferol (VITAMIN D3) 25 MCG (1000 UNIT) tablet Take 1,000 Units by mouth daily.   cyanocobalamin  (VITAMIN B12) 1000 MCG/ML injection Inject 1 mL (1,000 mcg total) into the skin every 30 (thirty) days.   ferrous sulfate  325 (65 FE) MG EC tablet Take 1 tablet (325 mg total) by mouth 3 (three) times daily with meals.   glipiZIDE  (GLUCOTROL  XL) 5 MG 24 hr tablet TAKE 1 TABLET BY MOUTH DAILY   insulin  isophane & regular human KwikPen (NOVOLIN  70/30 KWIKPEN) (70-30) 100 UNIT/ML KwikPen Inject 14 Units into the skin 2 (two)  times daily. Inject 30 units in morning, inject 25 units at bedtime   ipratropium-albuterol  (DUONEB) 0.5-2.5 (3) MG/3ML SOLN Take 3 mLs by nebulization every 6 (six) hours as needed.   JARDIANCE 25 MG TABS tablet Take 1 tablet (25 mg total) by mouth daily.   losartan  (COZAAR ) 100 MG tablet Take 100 mg by mouth daily.   metoprolol  tartrate (LOPRESSOR ) 50 MG tablet Take 1 tablet (50 mg total) by mouth 2 (two) times daily.   ONETOUCH ULTRA test strip 1 each 2 (two) times daily.   rosuvastatin  (CRESTOR ) 10 MG tablet Take 10 mg by mouth daily.   senna-docusate (SENOKOT-S) 8.6-50 MG tablet Take 2 tablets by mouth 2 (two) times daily.   Syringe, Disposable, (B-D SYRINGE SLIP TIP 1CC) 1 ML MISC 1 mL by Does not apply route once a week.   HYDROcodone-acetaminophen  (NORCO/VICODIN) 5-325 MG  tablet Take 1 tablet by mouth every 4 (four) hours as needed for moderate pain (pain score 4-6). (Patient not taking: Reported on 11/22/2023)   No facility-administered medications prior to visit.    Review of Systems  Constitutional: Negative.  Negative for chills, fever and malaise/fatigue.  HENT: Negative.  Negative for congestion and sore throat.   Eyes: Negative.  Negative for blurred vision and pain.  Respiratory: Negative.  Negative for cough and shortness of breath.   Cardiovascular: Negative.  Negative for chest pain, palpitations and leg swelling.  Gastrointestinal: Negative.  Negative for abdominal pain, blood in stool, constipation, diarrhea, heartburn, melena, nausea and vomiting.  Genitourinary: Negative.  Negative for dysuria, flank pain, frequency and urgency.  Musculoskeletal: Negative.  Negative for joint pain and myalgias.  Skin: Negative.   Neurological: Negative.  Negative for dizziness, tingling, sensory change, weakness and headaches.  Endo/Heme/Allergies: Negative.   Psychiatric/Behavioral: Negative.  Negative for depression and suicidal ideas. The patient is not nervous/anxious.         Objective:   BP 122/66   Pulse (!) 105   Ht 5' 2 (1.575 m)   Wt 227 lb 6.4 oz (103.1 kg)   SpO2 99%   BMI 41.59 kg/m   Vitals:   11/22/23 1404  BP: 122/66  Pulse: (!) 105  Height: 5' 2 (1.575 m)  Weight: 227 lb 6.4 oz (103.1 kg)  SpO2: 99%  BMI (Calculated): 41.58    Physical Exam Vitals and nursing note reviewed.  Constitutional:      Appearance: Normal appearance.  HENT:     Head: Normocephalic and atraumatic.     Nose: Nose normal.     Mouth/Throat:     Mouth: Mucous membranes are moist.     Pharynx: Oropharynx is clear.  Eyes:     Conjunctiva/sclera: Conjunctivae normal.     Pupils: Pupils are equal, round, and reactive to light.  Cardiovascular:     Rate and Rhythm: Normal rate and regular rhythm.     Pulses: Normal pulses.     Heart sounds: Normal heart sounds. No murmur heard. Pulmonary:     Effort: Pulmonary effort is normal.     Breath sounds: Normal breath sounds. No wheezing.  Abdominal:     General: A surgical scar is present. Bowel sounds are normal.     Palpations: Abdomen is soft.     Tenderness: There is no abdominal tenderness. There is no right CVA tenderness or left CVA tenderness.   Musculoskeletal:        General: Normal range of motion.     Cervical back: Normal range of motion.     Right lower leg: 1+ Pitting Edema present.     Left lower leg: 1+ Pitting Edema present.  Skin:    General: Skin is warm and dry.  Neurological:     General: No focal deficit present.     Mental Status: She is alert and oriented to person, place, and time.  Psychiatric:        Mood and Affect: Mood normal.        Behavior: Behavior normal.      Results for orders placed or performed in visit on 11/22/23  POCT CBG (Fasting - Glucose)  Result Value Ref Range   Glucose Fasting, POC 105 (A) 70 - 99 mg/dL    Recent Results (from the past 2160 hours)  CMP14+EGFR     Status: Abnormal   Collection Time: 09/26/23 11:35 AM  Result Value  Ref Range    Glucose 95 70 - 99 mg/dL   BUN 28 (H) 8 - 27 mg/dL   Creatinine, Ser 8.55 (H) 0.57 - 1.00 mg/dL   eGFR 38 (L) >40 fO/fpw/8.26   BUN/Creatinine Ratio 19 12 - 28   Sodium 143 134 - 144 mmol/L   Potassium 4.6 3.5 - 5.2 mmol/L   Chloride 104 96 - 106 mmol/L   CO2 23 20 - 29 mmol/L   Calcium  9.8 8.7 - 10.3 mg/dL   Total Protein 7.3 6.0 - 8.5 g/dL   Albumin 4.4 3.8 - 4.8 g/dL   Globulin, Total 2.9 1.5 - 4.5 g/dL   Bilirubin Total 0.5 0.0 - 1.2 mg/dL   Alkaline Phosphatase 92 44 - 121 IU/L    Comment: **Effective October 07, 2023 Alkaline Phosphatase**   reference interval will be changing to:              Age                Female          Female           0 -  5 days         47 - 127       47 - 127           6 - 10 days         29 - 242       29 - 242          11 - 20 days        109 - 357      109 - 357          21 - 30 days         94 - 494       94 - 494           1 -  2 months      149 - 539      149 - 539           3 -  6 months      131 - 452      131 - 452           7 - 11 months      117 - 401      117 - 401   12 months -  6 years       158 - 369      158 - 369           7 - 12 years       150 - 409      150 - 409               13 years       156 - 435       78 - 227               14 years       114 - 375       64 - 161               15 years        88 - 279       56 - 134               16 years        74 - 207  51 - 121               17 years        63 - 161       47 - 113          18 - 20 years        51 - 125       42 - 106          21 - 50 years         47 - 123       41 - 116          51 - 80 years        49 - 135       51 - 125              >80 years        48 - 129       48 - 129    AST 19 0 - 40 IU/L   ALT 11 0 - 32 IU/L  Lipid Panel w/o Chol/HDL Ratio     Status: Abnormal   Collection Time: 09/26/23 11:35 AM  Result Value Ref Range   Cholesterol, Total 157 100 - 199 mg/dL   Triglycerides 834 (H) 0 - 149 mg/dL   HDL 49 >60 mg/dL   VLDL Cholesterol Cal 28 5  - 40 mg/dL   LDL Chol Calc (NIH) 80 0 - 99 mg/dL  Hemoglobin J8r     Status: Abnormal   Collection Time: 09/26/23 11:35 AM  Result Value Ref Range   Hgb A1c MFr Bld 8.8 (H) 4.8 - 5.6 %    Comment:          Prediabetes: 5.7 - 6.4          Diabetes: >6.4          Glycemic control for adults with diabetes: <7.0    Est. average glucose Bld gHb Est-mCnc 206 mg/dL  POCT CBG (Fasting - Glucose)     Status: Abnormal   Collection Time: 10/03/23 10:07 AM  Result Value Ref Range   Glucose Fasting, POC 188 (A) 70 - 99 mg/dL  POCT CBG (Fasting - Glucose)     Status: Abnormal   Collection Time: 10/17/23 11:38 AM  Result Value Ref Range   Glucose Fasting, POC 170 (A) 70 - 99 mg/dL  POC CREATINE & ALBUMIN,URINE     Status: None   Collection Time: 10/17/23 12:09 PM  Result Value Ref Range   Microalbumin Ur, POC 30 mg/L   Creatinine, POC 50 mg/dL   Albumin/Creatinine Ratio, Urine, POC 30-300   Lipase, blood     Status: None   Collection Time: 11/05/23 10:30 AM  Result Value Ref Range   Lipase 20 11 - 51 U/L    Comment: Performed at [facility], [address] [place], [place] [address]  Comprehensive metabolic panel     Status: Abnormal   Collection Time: 11/05/23 10:30 AM  Result Value Ref Range   Sodium 130 (L) 135 - 145 mmol/L   Potassium 4.9 3.5 - 5.1 mmol/L    Comment: HEMOLYSIS AT THIS LEVEL MAY AFFECT RESULT   Chloride 97 (L) 98 - 111 mmol/L   CO2 18 (L) 22 - 32 mmol/L   Glucose, Bld 186 (H) 70 - 99 mg/dL    Comment: Glucose reference range applies only to samples taken after fasting for at least 8 hours.  BUN 89 (H) 8 - 23 mg/dL   Creatinine, Ser 7.61 (H) 0.44 - 1.00 mg/dL   Calcium  8.8 (L) 8.9 - 10.3 mg/dL   Total Protein 7.2 6.5 - 8.1 g/dL   Albumin 3.4 (L) 3.5 - 5.0 g/dL   AST 33 15 - 41 U/L    Comment: HEMOLYSIS AT THIS LEVEL MAY AFFECT RESULT   ALT 16 0 - 44 U/L    Comment: HEMOLYSIS AT THIS LEVEL MAY AFFECT RESULT   Alkaline Phosphatase 74 38 - 126 U/L    Total Bilirubin 1.7 (H) 0.0 - 1.2 mg/dL    Comment: HEMOLYSIS AT THIS LEVEL MAY AFFECT RESULT   GFR, Estimated 21 (L) >60 mL/min    Comment: (NOTE) Calculated using the CKD-EPI Creatinine Equation (2021)    Anion gap 15 5 - 15    Comment: Performed at [facility], [address] [place], [place] [address]  CBC     Status: Abnormal   Collection Time: 11/05/23 10:30 AM  Result Value Ref Range   WBC 12.1 (H) 4.0 - 10.5 K/uL   RBC 5.77 (H) 3.87 - 5.11 MIL/uL   Hemoglobin 15.3 (H) 12.0 - 15.0 g/dL   HCT 51.7 (H) 63.9 - 53.9 %   MCV 83.5 80.0 - 100.0 fL   MCH 26.5 26.0 - 34.0 pg   MCHC 31.7 30.0 - 36.0 g/dL   RDW 84.1 (H) 88.4 - 84.4 %   Platelets 407 (H) 150 - 400 K/uL   nRBC 0.0 0.0 - 0.2 %    Comment: Performed at [facility], [address] [place], [place] [address]  CK     Status: None   Collection Time: 11/05/23 10:30 AM  Result Value Ref Range   Total CK 55 38 - 234 U/L    Comment: Performed at [facility], [address] [place], [place] [address]  CBG monitoring, ED     Status: Abnormal   Collection Time: 11/05/23 10:33 AM  Result Value Ref Range   Glucose-Capillary 198 (H) 70 - 99 mg/dL    Comment: Glucose reference range applies only to samples taken after fasting for at least 8 hours.  Gastrointestinal Panel by PCR , Stool     Status: None   Collection Time: 11/05/23 11:11 AM   Specimen: Stool  Result Value Ref Range   Campylobacter species NOT DETECTED NOT DETECTED   Plesimonas shigelloides NOT DETECTED NOT DETECTED   Salmonella species NOT DETECTED NOT DETECTED   Yersinia enterocolitica NOT DETECTED NOT DETECTED   Vibrio species NOT DETECTED NOT DETECTED   Vibrio cholerae NOT DETECTED NOT DETECTED   Enteroaggregative E coli (EAEC) NOT DETECTED NOT DETECTED   Enteropathogenic E coli (EPEC) NOT DETECTED NOT DETECTED   Enterotoxigenic E coli (ETEC) NOT DETECTED NOT DETECTED   Shiga like toxin producing E coli (STEC) NOT DETECTED  NOT DETECTED   Shigella/Enteroinvasive E coli (EIEC) NOT DETECTED NOT DETECTED   Cryptosporidium NOT DETECTED NOT DETECTED   Cyclospora cayetanensis NOT DETECTED NOT DETECTED   Entamoeba histolytica NOT DETECTED NOT DETECTED   Giardia lamblia NOT DETECTED NOT DETECTED   Adenovirus F40/41 NOT DETECTED NOT DETECTED   Astrovirus NOT DETECTED NOT DETECTED   Norovirus GI/GII NOT DETECTED NOT DETECTED   Rotavirus A NOT DETECTED NOT DETECTED   Sapovirus (I, II, IV, and V) NOT DETECTED NOT DETECTED    Comment: Performed at [facility], [address], [place], [place] [address]  C Difficile Quick  Screen w PCR reflex     Status: None   Collection Time: 11/05/23 11:11 AM   Specimen: STOOL  Result Value Ref Range   C Diff antigen NEGATIVE NEGATIVE   C Diff toxin NEGATIVE NEGATIVE   C Diff interpretation No C. difficile detected.     Comment: Performed at [facility], [address] [place], [place] [address]  Resp panel by RT-PCR (RSV, Flu A&B, Covid) Anterior Nasal Swab     Status: None   Collection Time: 11/05/23 11:21 AM   Specimen: Anterior Nasal Swab  Result Value Ref Range   SARS Coronavirus 2 by RT PCR NEGATIVE NEGATIVE    Comment: (NOTE) SARS-CoV-2 target nucleic acids are NOT DETECTED.  The SARS-CoV-2 RNA is generally detectable in upper respiratory specimens during the acute phase of infection. The lowest concentration of SARS-CoV-2 viral copies this assay can detect is 138 copies/mL. A negative result does not preclude SARS-Cov-2 infection and should not be used as the sole basis for treatment or other patient management decisions. A negative result may occur with  improper specimen collection/handling, submission of specimen other than nasopharyngeal swab, presence of viral mutation(s) within the areas targeted by this assay, and inadequate number of viral copies(<138 copies/mL). A negative result must be combined with clinical observations, patient  history, and epidemiological information. The expected result is Negative.  Fact Sheet for Patients:  bloggercourse.com  Fact Sheet for Healthcare Providers:  seriousbroker.it  This test is no t yet approved or cleared by the [place]  FDA and  has been authorized for detection and/or diagnosis of SARS-CoV-2 by FDA under an Emergency Use Authorization (EUA). This EUA will remain  in effect (meaning this test can be used) for the duration of the COVID-19 declaration under Section 564(b)(1) of the Act, 21 U.S.C.section 360bbb-3(b)(1), unless the authorization is terminated  or revoked sooner.       Influenza A by PCR NEGATIVE NEGATIVE   Influenza B by PCR NEGATIVE NEGATIVE    Comment: (NOTE) The Xpert Xpress SARS-CoV-2/FLU/RSV plus assay is intended as an aid in the diagnosis of influenza from Nasopharyngeal swab specimens and should not be used as a sole basis for treatment. Nasal washings and aspirates are unacceptable for Xpert Xpress SARS-CoV-2/FLU/RSV testing.  Fact Sheet for Patients: bloggercourse.com  Fact Sheet for Healthcare Providers: seriousbroker.it  This test is not yet approved or cleared by the [place]  FDA and has been authorized for detection and/or diagnosis of SARS-CoV-2 by FDA under an Emergency Use Authorization (EUA). This EUA will remain in effect (meaning this test can be used) for the duration of the COVID-19 declaration under Section 564(b)(1) of the Act, 21 U.S.C. section 360bbb-3(b)(1), unless the authorization is terminated or revoked.     Resp Syncytial Virus by PCR NEGATIVE NEGATIVE    Comment: (NOTE) Fact Sheet for Patients: bloggercourse.com  Fact Sheet for Healthcare Providers: seriousbroker.it  This test is not yet approved or cleared by the [place]  FDA and has been  authorized for detection and/or diagnosis of SARS-CoV-2 by FDA under an Emergency Use Authorization (EUA). This EUA will remain in effect (meaning this test can be used) for the duration of the COVID-19 declaration under Section 564(b)(1) of the Act, 21 U.S.C. section 360bbb-3(b)(1), unless the authorization is terminated or revoked.  Performed at [facility], [address] [place], [place] [address]   Urinalysis, w/ Reflex to Culture (Infection Suspected) -Urine, Clean Catch     Status: Abnormal   Collection  Time: 11/05/23 11:53 AM  Result Value Ref Range   Specimen Source URINE, CLEAN CATCH    Color, Urine AMBER (A) YELLOW    Comment: BIOCHEMICALS MAY BE AFFECTED BY COLOR   APPearance CLOUDY (A) CLEAR   Specific Gravity, Urine 1.015 1.005 - 1.030   pH 5.0 5.0 - 8.0   Glucose, UA >=500 (A) NEGATIVE mg/dL   Hgb urine dipstick NEGATIVE NEGATIVE   Bilirubin Urine NEGATIVE NEGATIVE   Ketones, ur NEGATIVE NEGATIVE mg/dL   Protein, ur NEGATIVE NEGATIVE mg/dL   Nitrite NEGATIVE NEGATIVE   Leukocytes,Ua LARGE (A) NEGATIVE   RBC / HPF 21-50 0 - 5 RBC/hpf   WBC, UA >50 0 - 5 WBC/hpf    Comment:        Reflex urine culture not performed if WBC <=10, OR if Squamous epithelial cells >5. If Squamous epithelial cells >5 suggest recollection.    Bacteria, UA MANY (A) NONE SEEN   Squamous Epithelial / HPF 11-20 0 - 5 /HPF   WBC Clumps PRESENT    Mucus PRESENT    Budding Yeast PRESENT    Hyaline Casts, UA PRESENT     Comment: Performed at [facility], [address] [place], [place] [address]  Urine Culture (for pregnant, neutropenic or urologic patients or patients with an indwelling urinary catheter)     Status: Abnormal   Collection Time: 11/05/23 11:53 AM   Specimen: Urine, Clean Catch  Result Value Ref Range   Specimen Description      URINE, CLEAN CATCH Performed at [facility], [address], [place], [place] [address]    Special Requests       Normal Performed at [facility], [address], [place], [place] [address]    Culture >=100,000 COLONIES/mL KLEBSIELLA PNEUMONIAE (A)    Report Status 11/07/2023 FINAL    Organism ID, Bacteria KLEBSIELLA PNEUMONIAE (A)       Susceptibility   Klebsiella pneumoniae - MIC*    AMPICILLIN >=32 RESISTANT Resistant     CEFAZOLIN  (URINE) Value in next row Sensitive      2 SENSITIVEThis is a modified FDA-approved test that has been validated and its performance characteristics determined by the reporting laboratory.  This laboratory is certified under the Clinical Laboratory Improvement Amendments CLIA as qualified to perform high complexity clinical laboratory testing.    CEFEPIME Value in next row Sensitive      2 SENSITIVEThis is a modified FDA-approved test that has been validated and its performance characteristics determined by the reporting laboratory.  This laboratory is certified under the Clinical Laboratory Improvement Amendments CLIA as qualified to perform high complexity clinical laboratory testing.    ERTAPENEM Value in next row Sensitive      2 SENSITIVEThis is a modified FDA-approved test that has been validated and its performance characteristics determined by the reporting laboratory.  This laboratory is certified under the Clinical Laboratory Improvement Amendments CLIA as qualified to perform high complexity clinical laboratory testing.    CEFTRIAXONE Value in next row Sensitive      2 SENSITIVEThis is a modified FDA-approved test that has been validated and its performance characteristics determined by the reporting laboratory.  This laboratory is certified under the Clinical Laboratory Improvement Amendments CLIA as qualified to perform high complexity clinical laboratory testing.    CIPROFLOXACIN Value in next row Sensitive      2 SENSITIVEThis is a modified FDA-approved test that has been validated and its performance characteristics determined by the  reporting  laboratory.  This laboratory is certified under the Clinical Laboratory Improvement Amendments CLIA as qualified to perform high complexity clinical laboratory testing.    GENTAMICIN  Value in next row Sensitive      2 SENSITIVEThis is a modified FDA-approved test that has been validated and its performance characteristics determined by the reporting laboratory.  This laboratory is certified under the Clinical Laboratory Improvement Amendments CLIA as qualified to perform high complexity clinical laboratory testing.    NITROFURANTOIN Value in next row Intermediate      2 SENSITIVEThis is a modified FDA-approved test that has been validated and its performance characteristics determined by the reporting laboratory.  This laboratory is certified under the Clinical Laboratory Improvement Amendments CLIA as qualified to perform high complexity clinical laboratory testing.    TRIMETH/SULFA Value in next row Sensitive      2 SENSITIVEThis is a modified FDA-approved test that has been validated and its performance characteristics determined by the reporting laboratory.  This laboratory is certified under the Clinical Laboratory Improvement Amendments CLIA as qualified to perform high complexity clinical laboratory testing.    AMPICILLIN/SULBACTAM Value in next row Sensitive      2 SENSITIVEThis is a modified FDA-approved test that has been validated and its performance characteristics determined by the reporting laboratory.  This laboratory is certified under the Clinical Laboratory Improvement Amendments CLIA as qualified to perform high complexity clinical laboratory testing.    PIP/TAZO Value in next row Sensitive      <=4 SENSITIVEThis is a modified FDA-approved test that has been validated and its performance characteristics determined by the reporting laboratory.  This laboratory is certified under the Clinical Laboratory Improvement Amendments CLIA as qualified to perform high complexity clinical laboratory  testing.    MEROPENEM Value in next row Sensitive      <=4 SENSITIVEThis is a modified FDA-approved test that has been validated and its performance characteristics determined by the reporting laboratory.  This laboratory is certified under the Clinical Laboratory Improvement Amendments CLIA as qualified to perform high complexity clinical laboratory testing.    * >=100,000 COLONIES/mL KLEBSIELLA PNEUMONIAE  Blood culture (routine x 2)     Status: None   Collection Time: 11/05/23  1:03 PM   Specimen: BLOOD  Result Value Ref Range   Specimen Description BLOOD BLOOD LEFT ARM    Special Requests      BOTTLES DRAWN AEROBIC AND ANAEROBIC Blood Culture results may not be optimal due to an inadequate volume of blood received in culture bottles   Culture      NO GROWTH 5 DAYS Performed at [facility], [address] [place], [place] [address]    Report Status 11/10/2023 FINAL   Blood culture (routine x 2)     Status: None   Collection Time: 11/05/23  1:45 PM   Specimen: BLOOD  Result Value Ref Range   Specimen Description BLOOD BLOOD RIGHT FOREARM    Special Requests      BOTTLES DRAWN AEROBIC AND ANAEROBIC Blood Culture results may not be optimal due to an inadequate volume of blood received in culture bottles   Culture      NO GROWTH 5 DAYS Performed at [facility], [address] [place], [place] [address]    Report Status 11/10/2023 FINAL   Lactic acid, plasma     Status: None   Collection Time: 11/05/23  1:45 PM  Result Value Ref Range   Lactic Acid, Venous 1.4 0.5 - 1.9 mmol/L  Comment: Performed at [facility], [address] [place], [place] [address]  CBG monitoring, ED     Status: Abnormal   Collection Time: 11/05/23  3:41 PM  Result Value Ref Range   Glucose-Capillary 103 (H) 70 - 99 mg/dL    Comment: Glucose reference range applies only to samples taken after fasting for at least 8 hours.  CBG monitoring, ED     Status: None    Collection Time: 11/05/23  6:56 PM  Result Value Ref Range   Glucose-Capillary 96 70 - 99 mg/dL    Comment: Glucose reference range applies only to samples taken after fasting for at least 8 hours.  Lactic acid, plasma     Status: None   Collection Time: 11/05/23  8:39 PM  Result Value Ref Range   Lactic Acid, Venous 1.3 0.5 - 1.9 mmol/L    Comment: Performed at [facility], [address] [place], [place] [address]  Glucose, capillary     Status: Abnormal   Collection Time: 11/05/23  8:40 PM  Result Value Ref Range   Glucose-Capillary 134 (H) 70 - 99 mg/dL    Comment: Glucose reference range applies only to samples taken after fasting for at least 8 hours.  Basic metabolic panel     Status: Abnormal   Collection Time: 11/06/23  6:39 AM  Result Value Ref Range   Sodium 136 135 - 145 mmol/L   Potassium 3.8 3.5 - 5.1 mmol/L   Chloride 102 98 - 111 mmol/L   CO2 19 (L) 22 - 32 mmol/L   Glucose, Bld 179 (H) 70 - 99 mg/dL    Comment: Glucose reference range applies only to samples taken after fasting for at least 8 hours.   BUN 77 (H) 8 - 23 mg/dL   Creatinine, Ser 7.98 (H) 0.44 - 1.00 mg/dL   Calcium  8.0 (L) 8.9 - 10.3 mg/dL   GFR, Estimated 26 (L) >60 mL/min    Comment: (NOTE) Calculated using the CKD-EPI Creatinine Equation (2021)    Anion gap 15 5 - 15    Comment: Performed at [facility], [address] [place], [place] [address]  CBC     Status: Abnormal   Collection Time: 11/06/23  6:39 AM  Result Value Ref Range   WBC 8.7 4.0 - 10.5 K/uL   RBC 4.55 3.87 - 5.11 MIL/uL   Hemoglobin 12.3 12.0 - 15.0 g/dL   HCT 61.3 63.9 - 53.9 %   MCV 84.8 80.0 - 100.0 fL   MCH 27.0 26.0 - 34.0 pg   MCHC 31.9 30.0 - 36.0 g/dL   RDW 84.0 (H) 88.4 - 84.4 %   Platelets 279 150 - 400 K/uL   nRBC 0.0 0.0 - 0.2 %    Comment: Performed at [facility], [address] [place], [place] [address]  Glucose, capillary     Status: Abnormal   Collection Time:  11/06/23  8:39 AM  Result Value Ref Range   Glucose-Capillary 177 (H) 70 - 99 mg/dL    Comment: Glucose reference range applies only to samples taken after fasting for at least 8 hours.  Glucose, capillary     Status: Abnormal   Collection Time: 11/06/23 12:07 PM  Result Value Ref Range   Glucose-Capillary 180 (H) 70 - 99 mg/dL    Comment: Glucose reference range applies only to samples taken after fasting for at least 8 hours.  Glucose, capillary     Status: Abnormal  Collection Time: 11/06/23  4:19 PM  Result Value Ref Range   Glucose-Capillary 250 (H) 70 - 99 mg/dL    Comment: Glucose reference range applies only to samples taken after fasting for at least 8 hours.  Glucose, capillary     Status: Abnormal   Collection Time: 11/06/23  8:55 PM  Result Value Ref Range   Glucose-Capillary 298 (H) 70 - 99 mg/dL    Comment: Glucose reference range applies only to samples taken after fasting for at least 8 hours.  Basic metabolic panel with GFR     Status: Abnormal   Collection Time: 11/07/23  5:18 AM  Result Value Ref Range   Sodium 137 135 - 145 mmol/L   Potassium 4.4 3.5 - 5.1 mmol/L   Chloride 104 98 - 111 mmol/L   CO2 24 22 - 32 mmol/L   Glucose, Bld 210 (H) 70 - 99 mg/dL    Comment: Glucose reference range applies only to samples taken after fasting for at least 8 hours.   BUN 67 (H) 8 - 23 mg/dL   Creatinine, Ser 8.22 (H) 0.44 - 1.00 mg/dL   Calcium  8.5 (L) 8.9 - 10.3 mg/dL   GFR, Estimated 30 (L) >60 mL/min    Comment: (NOTE) Calculated using the CKD-EPI Creatinine Equation (2021)    Anion gap 9 5 - 15    Comment: Performed at [facility], [address] [place], [place] [address]  Magnesium     Status: Abnormal   Collection Time: 11/07/23  5:18 AM  Result Value Ref Range   Magnesium 2.8 (H) 1.7 - 2.4 mg/dL    Comment: Performed at [facility], [address] [place], [place] [address]  CBC     Status: Abnormal   Collection Time: 11/07/23  5:18  AM  Result Value Ref Range   WBC 10.9 (H) 4.0 - 10.5 K/uL   RBC 4.28 3.87 - 5.11 MIL/uL   Hemoglobin 11.5 (L) 12.0 - 15.0 g/dL   HCT 63.2 63.9 - 53.9 %   MCV 85.7 80.0 - 100.0 fL   MCH 26.9 26.0 - 34.0 pg   MCHC 31.3 30.0 - 36.0 g/dL   RDW 83.8 (H) 88.4 - 84.4 %   Platelets 257 150 - 400 K/uL   nRBC 0.0 0.0 - 0.2 %    Comment: Performed at [facility], [address] [place], [place] [address]  Glucose, capillary     Status: Abnormal   Collection Time: 11/07/23  8:03 AM  Result Value Ref Range   Glucose-Capillary 208 (H) 70 - 99 mg/dL    Comment: Glucose reference range applies only to samples taken after fasting for at least 8 hours.  Glucose, capillary     Status: Abnormal   Collection Time: 11/07/23 11:50 AM  Result Value Ref Range   Glucose-Capillary 301 (H) 70 - 99 mg/dL    Comment: Glucose reference range applies only to samples taken after fasting for at least 8 hours.  Glucose, capillary     Status: Abnormal   Collection Time: 11/07/23  4:47 PM  Result Value Ref Range   Glucose-Capillary 215 (H) 70 - 99 mg/dL    Comment: Glucose reference range applies only to samples taken after fasting for at least 8 hours.  Glucose, capillary     Status: Abnormal   Collection Time: 11/07/23  9:02 PM  Result Value Ref Range   Glucose-Capillary 245 (H) 70 - 99 mg/dL    Comment: Glucose  reference range applies only to samples taken after fasting for at least 8 hours.  Basic metabolic panel with GFR     Status: Abnormal   Collection Time: 11/08/23  6:16 AM  Result Value Ref Range   Sodium 141 135 - 145 mmol/L   Potassium 3.9 3.5 - 5.1 mmol/L   Chloride 103 98 - 111 mmol/L   CO2 27 22 - 32 mmol/L   Glucose, Bld 237 (H) 70 - 99 mg/dL    Comment: Glucose reference range applies only to samples taken after fasting for at least 8 hours.   BUN 46 (H) 8 - 23 mg/dL   Creatinine, Ser 8.60 (H) 0.44 - 1.00 mg/dL   Calcium  8.4 (L) 8.9 - 10.3 mg/dL   GFR, Estimated 40 (L) >60 mL/min     Comment: (NOTE) Calculated using the CKD-EPI Creatinine Equation (2021)    Anion gap 11 5 - 15    Comment: Performed at [facility], [address] [place], [place] [address]  Magnesium     Status: None   Collection Time: 11/08/23  6:16 AM  Result Value Ref Range   Magnesium 2.2 1.7 - 2.4 mg/dL    Comment: Performed at [facility], [address] [place], [place] [address]  Glucose, capillary     Status: Abnormal   Collection Time: 11/08/23  8:03 AM  Result Value Ref Range   Glucose-Capillary 237 (H) 70 - 99 mg/dL    Comment: Glucose reference range applies only to samples taken after fasting for at least 8 hours.   Comment 1 Notify RN    Comment 2 Document in Chart   Glucose, capillary     Status: Abnormal   Collection Time: 11/08/23 11:16 AM  Result Value Ref Range   Glucose-Capillary 306 (H) 70 - 99 mg/dL    Comment: Glucose reference range applies only to samples taken after fasting for at least 8 hours.  POCT CBG (Fasting - Glucose)     Status: Abnormal   Collection Time: 11/22/23  2:10 PM  Result Value Ref Range   Glucose Fasting, POC 105 (A) 70 - 99 mg/dL      Assessment & Plan:  Start back hydrochlorothiazide. Continue taking medications as prescribed. Continue strict diet control and exercise as tolerated. B12 injection from home given today. Check routine blood work when patient returns. Problem List Items Addressed This Visit     Diabetes mellitus type 2, insulin  dependent (HCC)   Relevant Orders   POCT CBG (Fasting - Glucose) (Completed)   Pernicious anemia   Hypertension associated with diabetes (HCC) - Primary   Combined hyperlipidemia associated with type 2 diabetes mellitus (HCC)   Presence of heart assist device (HCC)   COPD (chronic obstructive pulmonary disease) (HCC)   Morbid obesity (HCC)   Acute kidney injury superimposed on stage 3b chronic kidney disease (HCC)   S/P repair of ventral hernia    Return in about 6 weeks  (around 01/03/2024).   Total time spent: 25 minutes. This time includes review of previous notes and results and patient face to face interaction during today's visit.    FERNAND FREDY RAMAN, MD  11/22/2023   This document may have been prepared by [facility] Voice Recognition software and as such may include unintentional dictation errors.

## 2023-11-25 ENCOUNTER — Telehealth: Payer: Self-pay | Admitting: Internal Medicine

## 2023-11-25 NOTE — Telephone Encounter (Signed)
 [place], PT with [facility], left VM requesting verbal orders for 2w1, then 1w2.   Callback # [phone]

## 2024-01-03 ENCOUNTER — Encounter: Payer: Self-pay | Admitting: Internal Medicine

## 2024-01-03 ENCOUNTER — Ambulatory Visit (INDEPENDENT_AMBULATORY_CARE_PROVIDER_SITE_OTHER): Admitting: Internal Medicine

## 2024-01-03 ENCOUNTER — Ambulatory Visit: Payer: Self-pay | Admitting: Internal Medicine

## 2024-01-03 VITALS — BP 124/76 | HR 109 | Ht 62.0 in | Wt 222.0 lb

## 2024-01-03 DIAGNOSIS — E1165 Type 2 diabetes mellitus with hyperglycemia: Secondary | ICD-10-CM

## 2024-01-03 DIAGNOSIS — N1832 Chronic kidney disease, stage 3b: Secondary | ICD-10-CM | POA: Insufficient documentation

## 2024-01-03 DIAGNOSIS — E1169 Type 2 diabetes mellitus with other specified complication: Secondary | ICD-10-CM | POA: Diagnosis not present

## 2024-01-03 DIAGNOSIS — D510 Vitamin B12 deficiency anemia due to intrinsic factor deficiency: Secondary | ICD-10-CM

## 2024-01-03 DIAGNOSIS — D500 Iron deficiency anemia secondary to blood loss (chronic): Secondary | ICD-10-CM | POA: Diagnosis not present

## 2024-01-03 DIAGNOSIS — Z6841 Body Mass Index (BMI) 40.0 and over, adult: Secondary | ICD-10-CM

## 2024-01-03 DIAGNOSIS — Z794 Long term (current) use of insulin: Secondary | ICD-10-CM

## 2024-01-03 DIAGNOSIS — E782 Mixed hyperlipidemia: Secondary | ICD-10-CM | POA: Diagnosis not present

## 2024-01-03 DIAGNOSIS — E1159 Type 2 diabetes mellitus with other circulatory complications: Secondary | ICD-10-CM

## 2024-01-03 DIAGNOSIS — E119 Type 2 diabetes mellitus without complications: Secondary | ICD-10-CM

## 2024-01-03 DIAGNOSIS — I152 Hypertension secondary to endocrine disorders: Secondary | ICD-10-CM | POA: Diagnosis not present

## 2024-01-03 LAB — POCT CBG (FASTING - GLUCOSE)-MANUAL ENTRY: Glucose Fasting, POC: 91 mg/dL (ref 70–99)

## 2024-01-03 MED ORDER — CYANOCOBALAMIN 1000 MCG/ML IJ SOLN
1000.0000 ug | Freq: Once | INTRAMUSCULAR | Status: AC
  Administered 2024-01-03: 1000 ug via INTRAMUSCULAR

## 2024-01-03 NOTE — Progress Notes (Signed)
 Established Patient Office Visit  Subjective:  Patient ID: Lorraine Boone, female    DOB: 11-24-1950  Age: 73 y.o. MRN: [id number]  Chief Complaint  Patient presents with   Follow-up    6 week follow up    Patient comes in for her follow-up today.  She is generally feeling well and has no new complaints.  She is taking her insulin  mix 70/30 at 30 units in the morning and 25 units at bedtime.  She is also on Jardiance  and glipizide .  Her fingerstick glucose record from home shows fluctuations related to her dietary indiscretions.  She is due for lab work today including hemoglobin A1c.  Will adjust the medications further depending upon the result.  Discussed the role of GLP-1 for her diabetes and weight management.  Patient wants to think about it and also wants to wait to see insurance coverage next year. Denies headaches or dizziness, no chest pain, no shortness of breath and no palpitations.  She still takes her vitamin B12 injection but would like the nurse to help her today.    No other concerns at this time.   Past Medical History:  Diagnosis Date   Arthritis    Bradycardia    COPD (chronic obstructive pulmonary disease) (HCC)    Diabetes mellitus type 2, uncomplicated (HCC)    Dyspnea    Hyperlipidemia    Hypertension    Intermittent complete heart block (HCC)    Iron  deficiency anemia due to chronic blood loss 08/18/2018    Past Surgical History:  Procedure Laterality Date   CHOLECYSTECTOMY     COLONOSCOPY WITH PROPOFOL  N/A 08/11/2018   Procedure: COLONOSCOPY WITH PROPOFOL ;  Surgeon: Unk Corinn Skiff, MD;  Location: [facility];  Service: Gastroenterology;  Laterality: N/A;   ESOPHAGOGASTRODUODENOSCOPY (EGD) WITH PROPOFOL  N/A 08/11/2018   Procedure: ESOPHAGOGASTRODUODENOSCOPY (EGD) WITH PROPOFOL ;  Surgeon: Unk Corinn Skiff, MD;  Location: [facility];  Service: Gastroenterology;  Laterality: N/A;   ESSURE TUBAL LIGATION     INSERT / REPLACE / REMOVE PACEMAKER      PACEMAKER INSERTION N/A 12/22/2015   Procedure: INSERTION PACEMAKER;  Surgeon: Marsa Dooms, MD;  Location: [facility];  Service: Cardiovascular;  Laterality: N/A;   PACEMAKER LEADLESS INSERTION N/A 08/27/2019   Procedure: PACEMAKER LEADLESS INSERTION;  Surgeon: Dooms Marsa, MD;  Location: [facility];  Service: Cardiovascular;  Laterality: N/A;   TEMPORARY PACEMAKER N/A 08/27/2019   Procedure: TEMPORARY PACEMAKER;  Surgeon: Dooms Marsa, MD;  Location: [facility];  Service: Cardiovascular;  Laterality: N/A;   XI ROBOTIC ASSISTED VENTRAL HERNIA  11/05/2023   Procedure: REPAIR, HERNIA, VENTRAL, ROBOT-ASSISTED;  Surgeon: Rodolph Romano, MD;  Location: [facility];  Service: General;;    Social History   Socioeconomic History   Marital status: Married    Spouse name: Not on file   Number of children: Not on file   Years of education: Not on file   Highest education level: Not on file  Occupational History   Not on file  Tobacco Use   Smoking status: Former    Current packs/day: 0.00    Average packs/day: 0.3 packs/day for 35.0 years (8.8 ttl pk-yrs)    Types: Cigarettes    Start date: 12/20/1980    Quit date: 12/21/2015    Years since quitting: 8.0   Smokeless tobacco: Never  Substance and Sexual Activity   Alcohol use: No   Drug use: No   Sexual activity: Not on file  Other Topics Concern  Not on file  Social History Narrative   Not on file   Social Drivers of Health   Tobacco Use: Medium Risk (01/03/2024)   Patient History    Smoking Tobacco Use: Former    Smokeless Tobacco Use: Never    Passive Exposure: Not on file  Financial Resource Strain: Not on file  Food Insecurity: No Food Insecurity (11/05/2023)   Epic    Worried About Programme Researcher, Broadcasting/film/video in the Last Year: Never true    Ran Out of Food in the Last Year: Never true  Transportation Needs: No Transportation Needs (11/05/2023)   Epic    Lack of Transportation  (Medical): No    Lack of Transportation (Non-Medical): No  Physical Activity: Not on file  Stress: Not on file  Social Connections: Moderately Isolated (11/05/2023)   Social Connection and Isolation Panel    Frequency of Communication with Friends and Family: Three times a week    Frequency of Social Gatherings with Friends and Family: Three times a week    Attends Religious Services: Never    Active Member of Clubs or Organizations: No    Attends Banker Meetings: Never    Marital Status: Married  Catering Manager Violence: Not At Risk (11/05/2023)   Epic    Fear of Current or Ex-Partner: No    Emotionally Abused: No    Physically Abused: No    Sexually Abused: No  Depression (PHQ2-9): Low Risk (08/16/2023)   Depression (PHQ2-9)    PHQ-2 Score: 0  Alcohol Screen: Not on file  Housing: Unknown (11/21/2023)   Received from [facility]   Epic    Unable to Pay for Housing in the Last Year: Not on file    Number of Times Moved in the Last Year: Not on file    At any time in the past 12 months, were you homeless or living in a shelter (including now)?: No  Utilities: Not At Risk (11/05/2023)   Epic    Threatened with loss of utilities: No  Health Literacy: Not on file    Family History  Problem Relation Age of Onset   Diabetes Mother    Heart disease Mother    Lung cancer Father     Allergies[1]  Show/hide medication list[2]  Review of Systems  Constitutional: Negative.  Negative for chills, fever and malaise/fatigue.  HENT: Negative.  Negative for congestion and sore throat.   Eyes: Negative.  Negative for blurred vision and pain.  Respiratory: Negative.  Negative for cough and shortness of breath.   Cardiovascular: Negative.  Negative for chest pain, palpitations and leg swelling.  Gastrointestinal: Negative.  Negative for abdominal pain, blood in stool, constipation, diarrhea, heartburn, melena, nausea and vomiting.  Genitourinary:  Negative.  Negative for dysuria, flank pain, frequency and urgency.  Musculoskeletal: Negative.  Negative for joint pain and myalgias.  Skin: Negative.   Neurological: Negative.  Negative for dizziness, tingling, sensory change, weakness and headaches.  Endo/Heme/Allergies: Negative.   Psychiatric/Behavioral: Negative.  Negative for depression and suicidal ideas. The patient is not nervous/anxious.        Objective:   BP 124/76   Pulse (!) 109   Ht 5' 2 (1.575 m)   Wt 222 lb (100.7 kg)   SpO2 97%   BMI 40.60 kg/m   Vitals:   01/03/24 1038  BP: 124/76  Pulse: (!) 109  Height: 5' 2 (1.575 m)  Weight: 222 lb (100.7 kg)  SpO2: 97%  BMI (Calculated): 40.59    Physical Exam Vitals and nursing note reviewed.  Constitutional:      Appearance: Normal appearance.  HENT:     Head: Normocephalic and atraumatic.     Nose: Nose normal.     Mouth/Throat:     Mouth: Mucous membranes are moist.     Pharynx: Oropharynx is clear.  Eyes:     Conjunctiva/sclera: Conjunctivae normal.     Pupils: Pupils are equal, round, and reactive to light.  Cardiovascular:     Rate and Rhythm: Normal rate and regular rhythm.     Pulses: Normal pulses.     Heart sounds: Normal heart sounds. No murmur heard. Pulmonary:     Effort: Pulmonary effort is normal.     Breath sounds: Normal breath sounds. No wheezing.  Abdominal:     General: Bowel sounds are normal.     Palpations: Abdomen is soft.     Tenderness: There is no abdominal tenderness. There is no right CVA tenderness or left CVA tenderness.  Musculoskeletal:        General: Normal range of motion.     Cervical back: Normal range of motion.     Right lower leg: No edema.     Left lower leg: No edema.  Skin:    General: Skin is warm and dry.  Neurological:     General: No focal deficit present.     Mental Status: She is alert and oriented to person, place, and time.  Psychiatric:        Mood and Affect: Mood normal.         Behavior: Behavior normal.      Results for orders placed or performed in visit on 01/03/24  POCT CBG (Fasting - Glucose)  Result Value Ref Range   Glucose Fasting, POC 91 70 - 99 mg/dL    Recent Results (from the past 2160 hours)  POCT CBG (Fasting - Glucose)     Status: Abnormal   Collection Time: 10/17/23 11:38 AM  Result Value Ref Range   Glucose Fasting, POC 170 (A) 70 - 99 mg/dL  POC CREATINE & ALBUMIN ,URINE     Status: None   Collection Time: 10/17/23 12:09 PM  Result Value Ref Range   Microalbumin Ur, POC 30 mg/L   Creatinine, POC 50 mg/dL   Albumin /Creatinine Ratio, Urine, POC 30-300   Lipase, blood     Status: None   Collection Time: 11/05/23 10:30 AM  Result Value Ref Range   Lipase 20 11 - 51 U/L    Comment: Performed at [facility], [address] [place], [place] [address]  Comprehensive metabolic panel     Status: Abnormal   Collection Time: 11/05/23 10:30 AM  Result Value Ref Range   Sodium 130 (L) 135 - 145 mmol/L   Potassium 4.9 3.5 - 5.1 mmol/L    Comment: HEMOLYSIS AT THIS LEVEL MAY AFFECT RESULT   Chloride 97 (L) 98 - 111 mmol/L   CO2 18 (L) 22 - 32 mmol/L   Glucose, Bld 186 (H) 70 - 99 mg/dL    Comment: Glucose reference range applies only to samples taken after fasting for at least 8 hours.   BUN 89 (H) 8 - 23 mg/dL   Creatinine, Ser 7.61 (H) 0.44 - 1.00 mg/dL   Calcium  8.8 (L) 8.9 - 10.3 mg/dL   Total Protein 7.2 6.5 - 8.1 g/dL   Albumin  3.4 (L) 3.5 - 5.0 g/dL   AST 33 15 -  41 U/L    Comment: HEMOLYSIS AT THIS LEVEL MAY AFFECT RESULT   ALT 16 0 - 44 U/L    Comment: HEMOLYSIS AT THIS LEVEL MAY AFFECT RESULT   Alkaline Phosphatase 74 38 - 126 U/L   Total Bilirubin 1.7 (H) 0.0 - 1.2 mg/dL    Comment: HEMOLYSIS AT THIS LEVEL MAY AFFECT RESULT   GFR, Estimated 21 (L) >60 mL/min    Comment: (NOTE) Calculated using the CKD-EPI Creatinine Equation (2021)    Anion gap 15 5 - 15    Comment: Performed at [facility], [address] [place], [place] [address]  CBC     Status: Abnormal   Collection Time: 11/05/23 10:30 AM  Result Value Ref Range   WBC 12.1 (H) 4.0 - 10.5 K/uL   RBC 5.77 (H) 3.87 - 5.11 MIL/uL   Hemoglobin 15.3 (H) 12.0 - 15.0 g/dL   HCT 51.7 (H) 63.9 - 53.9 %   MCV 83.5 80.0 - 100.0 fL   MCH 26.5 26.0 - 34.0 pg   MCHC 31.7 30.0 - 36.0 g/dL   RDW 84.1 (H) 88.4 - 84.4 %   Platelets 407 (H) 150 - 400 K/uL   nRBC 0.0 0.0 - 0.2 %    Comment: Performed at [facility], [address] [place], [place] [address]  CK     Status: None   Collection Time: 11/05/23 10:30 AM  Result Value Ref Range   Total CK 55 38 - 234 U/L    Comment: Performed at [facility], [address] [place], [place] [address]  CBG monitoring, ED     Status: Abnormal   Collection Time: 11/05/23 10:33 AM  Result Value Ref Range   Glucose-Capillary 198 (H) 70 - 99 mg/dL    Comment: Glucose reference range applies only to samples taken after fasting for at least 8 hours.  Gastrointestinal Panel by PCR , Stool     Status: None   Collection Time: 11/05/23 11:11 AM   Specimen: Stool  Result Value Ref Range   Campylobacter species NOT DETECTED NOT DETECTED   Plesimonas shigelloides NOT DETECTED NOT DETECTED   Salmonella species NOT DETECTED NOT DETECTED   Yersinia enterocolitica NOT DETECTED NOT DETECTED   Vibrio species NOT DETECTED NOT DETECTED   Vibrio cholerae NOT DETECTED NOT DETECTED   Enteroaggregative E coli (EAEC) NOT DETECTED NOT DETECTED   Enteropathogenic E coli (EPEC) NOT DETECTED NOT DETECTED   Enterotoxigenic E coli (ETEC) NOT DETECTED NOT DETECTED   Shiga like toxin producing E coli (STEC) NOT DETECTED NOT DETECTED   Shigella/Enteroinvasive E coli (EIEC) NOT DETECTED NOT DETECTED   Cryptosporidium NOT DETECTED NOT DETECTED   Cyclospora cayetanensis NOT DETECTED NOT DETECTED   Entamoeba histolytica NOT DETECTED NOT DETECTED   Giardia lamblia NOT DETECTED NOT DETECTED   Adenovirus  F40/41 NOT DETECTED NOT DETECTED   Astrovirus NOT DETECTED NOT DETECTED   Norovirus GI/GII NOT DETECTED NOT DETECTED   Rotavirus A NOT DETECTED NOT DETECTED   Sapovirus (I, II, IV, and V) NOT DETECTED NOT DETECTED    Comment: Performed at [facility], [address] [place], [place] [address]  C Difficile Quick Screen w PCR reflex     Status: None   Collection Time: 11/05/23 11:11 AM   Specimen: STOOL  Result Value Ref Range   C Diff antigen NEGATIVE NEGATIVE   C Diff toxin NEGATIVE NEGATIVE   C Diff interpretation No C. difficile detected.  Comment: Performed at [facility], [address] [place], [place] [address]  Resp panel by RT-PCR (RSV, Flu A&B, Covid) Anterior Nasal Swab     Status: None   Collection Time: 11/05/23 11:21 AM   Specimen: Anterior Nasal Swab  Result Value Ref Range   SARS Coronavirus 2 by RT PCR NEGATIVE NEGATIVE    Comment: (NOTE) SARS-CoV-2 target nucleic acids are NOT DETECTED.  The SARS-CoV-2 RNA is generally detectable in upper respiratory specimens during the acute phase of infection. The lowest concentration of SARS-CoV-2 viral copies this assay can detect is 138 copies/mL. A negative result does not preclude SARS-Cov-2 infection and should not be used as the sole basis for treatment or other patient management decisions. A negative result may occur with  improper specimen collection/handling, submission of specimen other than nasopharyngeal swab, presence of viral mutation(s) within the areas targeted by this assay, and inadequate number of viral copies(<138 copies/mL). A negative result must be combined with clinical observations, patient history, and epidemiological information. The expected result is Negative.  Fact Sheet for Patients:  bloggercourse.com  Fact Sheet for Healthcare Providers:  seriousbroker.it  This test is no t yet approved or cleared by the [place]  [place] FDA and  has been authorized for detection and/or diagnosis of SARS-CoV-2 by FDA under an Emergency Use Authorization (EUA). This EUA will remain  in effect (meaning this test can be used) for the duration of the COVID-19 declaration under Section 564(b)(1) of the Act, 21 U.S.C.section 360bbb-3(b)(1), unless the authorization is terminated  or revoked sooner.       Influenza A by PCR NEGATIVE NEGATIVE   Influenza B by PCR NEGATIVE NEGATIVE    Comment: (NOTE) The Xpert Xpress SARS-CoV-2/FLU/RSV plus assay is intended as an aid in the diagnosis of influenza from Nasopharyngeal swab specimens and should not be used as a sole basis for treatment. Nasal washings and aspirates are unacceptable for Xpert Xpress SARS-CoV-2/FLU/RSV testing.  Fact Sheet for Patients: bloggercourse.com  Fact Sheet for Healthcare Providers: seriousbroker.it  This test is not yet approved or cleared by the [place]  FDA and has been authorized for detection and/or diagnosis of SARS-CoV-2 by FDA under an Emergency Use Authorization (EUA). This EUA will remain in effect (meaning this test can be used) for the duration of the COVID-19 declaration under Section 564(b)(1) of the Act, 21 U.S.C. section 360bbb-3(b)(1), unless the authorization is terminated or revoked.     Resp Syncytial Virus by PCR NEGATIVE NEGATIVE    Comment: (NOTE) Fact Sheet for Patients: bloggercourse.com  Fact Sheet for Healthcare Providers: seriousbroker.it  This test is not yet approved or cleared by the [place]  FDA and has been authorized for detection and/or diagnosis of SARS-CoV-2 by FDA under an Emergency Use Authorization (EUA). This EUA will remain in effect (meaning this test can be used) for the duration of the COVID-19 declaration under Section 564(b)(1) of the Act, 21 U.S.C. section 360bbb-3(b)(1), unless  the authorization is terminated or revoked.  Performed at [facility], [address] [place], [place] [address]   Urinalysis, w/ Reflex to Culture (Infection Suspected) -Urine, Clean Catch     Status: Abnormal   Collection Time: 11/05/23 11:53 AM  Result Value Ref Range   Specimen Source URINE, CLEAN CATCH    Color, Urine AMBER (A) YELLOW    Comment: BIOCHEMICALS MAY BE AFFECTED BY COLOR   APPearance CLOUDY (A) CLEAR   Specific Gravity, Urine 1.015 1.005 - 1.030   pH 5.0  5.0 - 8.0   Glucose, UA >=500 (A) NEGATIVE mg/dL   Hgb urine dipstick NEGATIVE NEGATIVE   Bilirubin Urine NEGATIVE NEGATIVE   Ketones, ur NEGATIVE NEGATIVE mg/dL   Protein, ur NEGATIVE NEGATIVE mg/dL   Nitrite NEGATIVE NEGATIVE   Leukocytes,Ua LARGE (A) NEGATIVE   RBC / HPF 21-50 0 - 5 RBC/hpf   WBC, UA >50 0 - 5 WBC/hpf    Comment:        Reflex urine culture not performed if WBC <=10, OR if Squamous epithelial cells >5. If Squamous epithelial cells >5 suggest recollection.    Bacteria, UA MANY (A) NONE SEEN   Squamous Epithelial / HPF 11-20 0 - 5 /HPF   WBC Clumps PRESENT    Mucus PRESENT    Budding Yeast PRESENT    Hyaline Casts, UA PRESENT     Comment: Performed at [facility], [address] [place], [place] [address]  Urine Culture (for pregnant, neutropenic or urologic patients or patients with an indwelling urinary catheter)     Status: Abnormal   Collection Time: 11/05/23 11:53 AM   Specimen: Urine, Clean Catch  Result Value Ref Range   Specimen Description      URINE, CLEAN CATCH Performed at [facility], [address], [place], [place] [address]    Special Requests      Normal Performed at [facility], [address], [place], [place] [address]    Culture >=100,000 COLONIES/mL KLEBSIELLA PNEUMONIAE (A)    Report Status 11/07/2023 FINAL    Organism ID, Bacteria KLEBSIELLA PNEUMONIAE (A)       Susceptibility   Klebsiella pneumoniae - MIC*     AMPICILLIN >=32 RESISTANT Resistant     CEFAZOLIN  (URINE) Value in next row Sensitive      2 SENSITIVEThis is a modified FDA-approved test that has been validated and its performance characteristics determined by the reporting laboratory.  This laboratory is certified under the Clinical Laboratory Improvement Amendments CLIA as qualified to perform high complexity clinical laboratory testing.    CEFEPIME Value in next row Sensitive      2 SENSITIVEThis is a modified FDA-approved test that has been validated and its performance characteristics determined by the reporting laboratory.  This laboratory is certified under the Clinical Laboratory Improvement Amendments CLIA as qualified to perform high complexity clinical laboratory testing.    ERTAPENEM Value in next row Sensitive      2 SENSITIVEThis is a modified FDA-approved test that has been validated and its performance characteristics determined by the reporting laboratory.  This laboratory is certified under the Clinical Laboratory Improvement Amendments CLIA as qualified to perform high complexity clinical laboratory testing.    CEFTRIAXONE  Value in next row Sensitive      2 SENSITIVEThis is a modified FDA-approved test that has been validated and its performance characteristics determined by the reporting laboratory.  This laboratory is certified under the Clinical Laboratory Improvement Amendments CLIA as qualified to perform high complexity clinical laboratory testing.    CIPROFLOXACIN Value in next row Sensitive      2 SENSITIVEThis is a modified FDA-approved test that has been validated and its performance characteristics determined by the reporting laboratory.  This laboratory is certified under the Clinical Laboratory Improvement Amendments CLIA as qualified to perform high complexity clinical laboratory testing.    GENTAMICIN  Value in next row Sensitive      2 SENSITIVEThis is a modified FDA-approved test that has been validated and its  performance characteristics  determined by the reporting laboratory.  This laboratory is certified under the Clinical Laboratory Improvement Amendments CLIA as qualified to perform high complexity clinical laboratory testing.    NITROFURANTOIN Value in next row Intermediate      2 SENSITIVEThis is a modified FDA-approved test that has been validated and its performance characteristics determined by the reporting laboratory.  This laboratory is certified under the Clinical Laboratory Improvement Amendments CLIA as qualified to perform high complexity clinical laboratory testing.    TRIMETH/SULFA Value in next row Sensitive      2 SENSITIVEThis is a modified FDA-approved test that has been validated and its performance characteristics determined by the reporting laboratory.  This laboratory is certified under the Clinical Laboratory Improvement Amendments CLIA as qualified to perform high complexity clinical laboratory testing.    AMPICILLIN/SULBACTAM Value in next row Sensitive      2 SENSITIVEThis is a modified FDA-approved test that has been validated and its performance characteristics determined by the reporting laboratory.  This laboratory is certified under the Clinical Laboratory Improvement Amendments CLIA as qualified to perform high complexity clinical laboratory testing.    PIP/TAZO Value in next row Sensitive      <=4 SENSITIVEThis is a modified FDA-approved test that has been validated and its performance characteristics determined by the reporting laboratory.  This laboratory is certified under the Clinical Laboratory Improvement Amendments CLIA as qualified to perform high complexity clinical laboratory testing.    MEROPENEM Value in next row Sensitive      <=4 SENSITIVEThis is a modified FDA-approved test that has been validated and its performance characteristics determined by the reporting laboratory.  This laboratory is certified under the Clinical Laboratory Improvement Amendments CLIA as  qualified to perform high complexity clinical laboratory testing.    * >=100,000 COLONIES/mL KLEBSIELLA PNEUMONIAE  Blood culture (routine x 2)     Status: None   Collection Time: 11/05/23  1:03 PM   Specimen: BLOOD  Result Value Ref Range   Specimen Description BLOOD BLOOD LEFT ARM    Special Requests      BOTTLES DRAWN AEROBIC AND ANAEROBIC Blood Culture results may not be optimal due to an inadequate volume of blood received in culture bottles   Culture      NO GROWTH 5 DAYS Performed at [facility], [address] [place], [place] [address]    Report Status 11/10/2023 FINAL   Blood culture (routine x 2)     Status: None   Collection Time: 11/05/23  1:45 PM   Specimen: BLOOD  Result Value Ref Range   Specimen Description BLOOD BLOOD RIGHT FOREARM    Special Requests      BOTTLES DRAWN AEROBIC AND ANAEROBIC Blood Culture results may not be optimal due to an inadequate volume of blood received in culture bottles   Culture      NO GROWTH 5 DAYS Performed at [facility], [address] [place], [place] [address]    Report Status 11/10/2023 FINAL   Lactic acid, plasma     Status: None   Collection Time: 11/05/23  1:45 PM  Result Value Ref Range   Lactic Acid, Venous 1.4 0.5 - 1.9 mmol/L    Comment: Performed at [facility], [address] [place], [place] [address]  CBG monitoring, ED     Status: Abnormal   Collection Time: 11/05/23  3:41 PM  Result Value Ref Range   Glucose-Capillary 103 (H) 70 - 99 mg/dL    Comment: Glucose  reference range applies only to samples taken after fasting for at least 8 hours.  CBG monitoring, ED     Status: None   Collection Time: 11/05/23  6:56 PM  Result Value Ref Range   Glucose-Capillary 96 70 - 99 mg/dL    Comment: Glucose reference range applies only to samples taken after fasting for at least 8 hours.  Lactic acid, plasma     Status: None   Collection Time: 11/05/23  8:39 PM  Result Value Ref Range    Lactic Acid, Venous 1.3 0.5 - 1.9 mmol/L    Comment: Performed at [facility], [address] [place], [place] [address]  Glucose, capillary     Status: Abnormal   Collection Time: 11/05/23  8:40 PM  Result Value Ref Range   Glucose-Capillary 134 (H) 70 - 99 mg/dL    Comment: Glucose reference range applies only to samples taken after fasting for at least 8 hours.  Basic metabolic panel     Status: Abnormal   Collection Time: 11/06/23  6:39 AM  Result Value Ref Range   Sodium 136 135 - 145 mmol/L   Potassium 3.8 3.5 - 5.1 mmol/L   Chloride 102 98 - 111 mmol/L   CO2 19 (L) 22 - 32 mmol/L   Glucose, Bld 179 (H) 70 - 99 mg/dL    Comment: Glucose reference range applies only to samples taken after fasting for at least 8 hours.   BUN 77 (H) 8 - 23 mg/dL   Creatinine, Ser 7.98 (H) 0.44 - 1.00 mg/dL   Calcium  8.0 (L) 8.9 - 10.3 mg/dL   GFR, Estimated 26 (L) >60 mL/min    Comment: (NOTE) Calculated using the CKD-EPI Creatinine Equation (2021)    Anion gap 15 5 - 15    Comment: Performed at [facility], [address] [place], [place] [address]  CBC     Status: Abnormal   Collection Time: 11/06/23  6:39 AM  Result Value Ref Range   WBC 8.7 4.0 - 10.5 K/uL   RBC 4.55 3.87 - 5.11 MIL/uL   Hemoglobin 12.3 12.0 - 15.0 g/dL   HCT 61.3 63.9 - 53.9 %   MCV 84.8 80.0 - 100.0 fL   MCH 27.0 26.0 - 34.0 pg   MCHC 31.9 30.0 - 36.0 g/dL   RDW 84.0 (H) 88.4 - 84.4 %   Platelets 279 150 - 400 K/uL   nRBC 0.0 0.0 - 0.2 %    Comment: Performed at [facility], [address] [place], [place] [address]  Glucose, capillary     Status: Abnormal   Collection Time: 11/06/23  8:39 AM  Result Value Ref Range   Glucose-Capillary 177 (H) 70 - 99 mg/dL    Comment: Glucose reference range applies only to samples taken after fasting for at least 8 hours.  Glucose, capillary     Status: Abnormal   Collection Time: 11/06/23 12:07 PM  Result Value Ref Range    Glucose-Capillary 180 (H) 70 - 99 mg/dL    Comment: Glucose reference range applies only to samples taken after fasting for at least 8 hours.  Glucose, capillary     Status: Abnormal   Collection Time: 11/06/23  4:19 PM  Result Value Ref Range   Glucose-Capillary 250 (H) 70 - 99 mg/dL    Comment: Glucose reference range applies only to samples taken after fasting for at least 8 hours.  Glucose, capillary     Status: Abnormal  Collection Time: 11/06/23  8:55 PM  Result Value Ref Range   Glucose-Capillary 298 (H) 70 - 99 mg/dL    Comment: Glucose reference range applies only to samples taken after fasting for at least 8 hours.  Basic metabolic panel with GFR     Status: Abnormal   Collection Time: 11/07/23  5:18 AM  Result Value Ref Range   Sodium 137 135 - 145 mmol/L   Potassium 4.4 3.5 - 5.1 mmol/L   Chloride 104 98 - 111 mmol/L   CO2 24 22 - 32 mmol/L   Glucose, Bld 210 (H) 70 - 99 mg/dL    Comment: Glucose reference range applies only to samples taken after fasting for at least 8 hours.   BUN 67 (H) 8 - 23 mg/dL   Creatinine, Ser 8.22 (H) 0.44 - 1.00 mg/dL   Calcium  8.5 (L) 8.9 - 10.3 mg/dL   GFR, Estimated 30 (L) >60 mL/min    Comment: (NOTE) Calculated using the CKD-EPI Creatinine Equation (2021)    Anion gap 9 5 - 15    Comment: Performed at [facility], [address] [place], [place] [address]  Magnesium     Status: Abnormal   Collection Time: 11/07/23  5:18 AM  Result Value Ref Range   Magnesium 2.8 (H) 1.7 - 2.4 mg/dL    Comment: Performed at [facility], [address] [place], [place] [address]  CBC     Status: Abnormal   Collection Time: 11/07/23  5:18 AM  Result Value Ref Range   WBC 10.9 (H) 4.0 - 10.5 K/uL   RBC 4.28 3.87 - 5.11 MIL/uL   Hemoglobin 11.5 (L) 12.0 - 15.0 g/dL   HCT 63.2 63.9 - 53.9 %   MCV 85.7 80.0 - 100.0 fL   MCH 26.9 26.0 - 34.0 pg   MCHC 31.3 30.0 - 36.0 g/dL   RDW 83.8 (H) 88.4 - 84.4 %   Platelets 257 150 -  400 K/uL   nRBC 0.0 0.0 - 0.2 %    Comment: Performed at [facility], [address] [place], [place] [address]  Glucose, capillary     Status: Abnormal   Collection Time: 11/07/23  8:03 AM  Result Value Ref Range   Glucose-Capillary 208 (H) 70 - 99 mg/dL    Comment: Glucose reference range applies only to samples taken after fasting for at least 8 hours.  Glucose, capillary     Status: Abnormal   Collection Time: 11/07/23 11:50 AM  Result Value Ref Range   Glucose-Capillary 301 (H) 70 - 99 mg/dL    Comment: Glucose reference range applies only to samples taken after fasting for at least 8 hours.  Glucose, capillary     Status: Abnormal   Collection Time: 11/07/23  4:47 PM  Result Value Ref Range   Glucose-Capillary 215 (H) 70 - 99 mg/dL    Comment: Glucose reference range applies only to samples taken after fasting for at least 8 hours.  Glucose, capillary     Status: Abnormal   Collection Time: 11/07/23  9:02 PM  Result Value Ref Range   Glucose-Capillary 245 (H) 70 - 99 mg/dL    Comment: Glucose reference range applies only to samples taken after fasting for at least 8 hours.  Basic metabolic panel with GFR     Status: Abnormal   Collection Time: 11/08/23  6:16 AM  Result Value Ref Range   Sodium 141 135 - 145 mmol/L   Potassium  3.9 3.5 - 5.1 mmol/L   Chloride 103 98 - 111 mmol/L   CO2 27 22 - 32 mmol/L   Glucose, Bld 237 (H) 70 - 99 mg/dL    Comment: Glucose reference range applies only to samples taken after fasting for at least 8 hours.   BUN 46 (H) 8 - 23 mg/dL   Creatinine, Ser 8.60 (H) 0.44 - 1.00 mg/dL   Calcium  8.4 (L) 8.9 - 10.3 mg/dL   GFR, Estimated 40 (L) >60 mL/min    Comment: (NOTE) Calculated using the CKD-EPI Creatinine Equation (2021)    Anion gap 11 5 - 15    Comment: Performed at [facility], [address] [place], [place] [address]  Magnesium     Status: None   Collection Time: 11/08/23  6:16 AM  Result Value Ref Range    Magnesium 2.2 1.7 - 2.4 mg/dL    Comment: Performed at [facility], [address] [place], [place] [address]  Glucose, capillary     Status: Abnormal   Collection Time: 11/08/23  8:03 AM  Result Value Ref Range   Glucose-Capillary 237 (H) 70 - 99 mg/dL    Comment: Glucose reference range applies only to samples taken after fasting for at least 8 hours.   Comment 1 Notify RN    Comment 2 Document in Chart   Glucose, capillary     Status: Abnormal   Collection Time: 11/08/23 11:16 AM  Result Value Ref Range   Glucose-Capillary 306 (H) 70 - 99 mg/dL    Comment: Glucose reference range applies only to samples taken after fasting for at least 8 hours.  POCT CBG (Fasting - Glucose)     Status: Abnormal   Collection Time: 11/22/23  2:10 PM  Result Value Ref Range   Glucose Fasting, POC 105 (A) 70 - 99 mg/dL  POCT CBG (Fasting - Glucose)     Status: None   Collection Time: 01/03/24 10:43 AM  Result Value Ref Range   Glucose Fasting, POC 91 70 - 99 mg/dL      Assessment & Plan:  Continue current medications.  Check labs today.  Will discuss results once available. Problem List Items Addressed This Visit       Cardiovascular and Mediastinum   Hypertension associated with diabetes (HCC)   Relevant Orders   CMP14+EGFR     Endocrine   Diabetes mellitus type 2, insulin  dependent (HCC) - Primary   Relevant Orders   POCT CBG (Fasting - Glucose) (Completed)   Hemoglobin A1c   Combined hyperlipidemia associated with type 2 diabetes mellitus (HCC)   Relevant Orders   Lipid Panel w/o Chol/HDL Ratio     Genitourinary   CKD stage 3b, GFR 30-44 ml/min (HCC)     Other   Iron  deficiency anemia due to chronic blood loss   Pernicious anemia   Relevant Orders   CBC with Diff   Vitamin B12   Other Visit Diagnoses       Morbid obesity with body mass index (BMI) of 40.0 to 44.9 in adult [facility])           Return in about 3 months (around 04/02/2024).   Total time spent: 30  minutes. This time includes review of previous notes and results and patient face to face interaction during today's visit.    FERNAND FREDY RAMAN, MD  01/03/2024   This document may have been prepared by [facility] Voice Recognition software and as such may include unintentional dictation  errors.     [1] No Known Allergies [2]  Outpatient Medications Prior to Visit  Medication Sig   acetaminophen  (TYLENOL ) 325 MG tablet Take 650 mg by mouth every 6 (six) hours as needed for moderate pain.    cholecalciferol (VITAMIN D3) 25 MCG (1000 UNIT) tablet Take 1,000 Units by mouth daily.   cyanocobalamin  (VITAMIN B12) 1000 MCG/ML injection Inject 1 mL (1,000 mcg total) into the skin every 30 (thirty) days.   ferrous sulfate  325 (65 FE) MG EC tablet Take 1 tablet (325 mg total) by mouth 3 (three) times daily with meals.   glipiZIDE  (GLUCOTROL  XL) 5 MG 24 hr tablet TAKE 1 TABLET BY MOUTH DAILY   insulin  isophane & regular human KwikPen (NOVOLIN  70/30 KWIKPEN) (70-30) 100 UNIT/ML KwikPen Inject 14 Units into the skin 2 (two) times daily. Inject 30 units in morning, inject 25 units at bedtime   ipratropium-albuterol  (DUONEB) 0.5-2.5 (3) MG/3ML SOLN Take 3 mLs by nebulization every 6 (six) hours as needed.   JARDIANCE  25 MG TABS tablet Take 1 tablet (25 mg total) by mouth daily.   losartan  (COZAAR ) 100 MG tablet Take 100 mg by mouth daily.   metoprolol  tartrate (LOPRESSOR ) 50 MG tablet Take 1 tablet (50 mg total) by mouth 2 (two) times daily.   ONETOUCH ULTRA test strip 1 each 2 (two) times daily.   rosuvastatin  (CRESTOR ) 10 MG tablet Take 10 mg by mouth daily.   senna-docusate (SENOKOT-S) 8.6-50 MG tablet Take 2 tablets by mouth 2 (two) times daily.   Syringe, Disposable, (B-D SYRINGE SLIP TIP 1CC) 1 ML MISC 1 mL by Does not apply route once a week.   HYDROcodone -acetaminophen  (NORCO/VICODIN) 5-325 MG tablet Take 1 tablet by mouth every 4 (four) hours as needed for moderate pain (pain score 4-6). (Patient not  taking: Reported on 01/03/2024)   No facility-administered medications prior to visit.

## 2024-01-04 LAB — LIPID PANEL W/O CHOL/HDL RATIO
Cholesterol, Total: 135 mg/dL (ref 100–199)
HDL: 49 mg/dL (ref >39)
LDL Chol Calc (NIH): 63 mg/dL (ref 0–99)
Triglycerides: 128 mg/dL (ref 0–149)
VLDL Cholesterol Cal: 23 mg/dL (ref 5–40)

## 2024-01-04 LAB — CBC WITH DIFFERENTIAL/PLATELET
Basophils Absolute: 0.1 x10E3/uL (ref 0.0–0.2)
Basos: 1 %
EOS (ABSOLUTE): 0.1 x10E3/uL (ref 0.0–0.4)
Eos: 1 %
Hematocrit: 45.8 % (ref 34.0–46.6)
Hemoglobin: 13.9 g/dL (ref 11.1–15.9)
Immature Grans (Abs): 0 x10E3/uL (ref 0.0–0.1)
Immature Granulocytes: 0 %
Lymphocytes Absolute: 3.5 x10E3/uL — ABNORMAL HIGH (ref 0.7–3.1)
Lymphs: 35 %
MCH: 27.3 pg (ref 26.6–33.0)
MCHC: 30.3 g/dL — ABNORMAL LOW (ref 31.5–35.7)
MCV: 90 fL (ref 79–97)
Monocytes Absolute: 0.7 x10E3/uL (ref 0.1–0.9)
Monocytes: 7 %
Neutrophils Absolute: 5.7 x10E3/uL (ref 1.4–7.0)
Neutrophils: 56 %
Platelets: 270 x10E3/uL (ref 150–450)
RBC: 5.1 x10E6/uL (ref 3.77–5.28)
RDW: 14.5 % (ref 11.7–15.4)
WBC: 10.1 x10E3/uL (ref 3.4–10.8)

## 2024-01-04 LAB — CMP14+EGFR
ALT: 7 IU/L (ref 0–32)
AST: 17 IU/L (ref 0–40)
Albumin: 4.2 g/dL (ref 3.8–4.8)
Alkaline Phosphatase: 85 IU/L (ref 49–135)
BUN/Creatinine Ratio: 17 (ref 12–28)
BUN: 25 mg/dL (ref 8–27)
Bilirubin Total: 0.6 mg/dL (ref 0.0–1.2)
CO2: 24 mmol/L (ref 20–29)
Calcium: 9.8 mg/dL (ref 8.7–10.3)
Chloride: 101 mmol/L (ref 96–106)
Creatinine, Ser: 1.45 mg/dL — ABNORMAL HIGH (ref 0.57–1.00)
Globulin, Total: 2.9 g/dL (ref 1.5–4.5)
Glucose: 66 mg/dL — ABNORMAL LOW (ref 70–99)
Potassium: 4 mmol/L (ref 3.5–5.2)
Sodium: 143 mmol/L (ref 134–144)
Total Protein: 7.1 g/dL (ref 6.0–8.5)
eGFR: 38 mL/min/1.73 — ABNORMAL LOW (ref >59)

## 2024-01-04 LAB — HEMOGLOBIN A1C
Est. average glucose Bld gHb Est-mCnc: 171 mg/dL
Hgb A1c MFr Bld: 7.6 % — ABNORMAL HIGH (ref 4.8–5.6)

## 2024-01-04 LAB — VITAMIN B12: Vitamin B-12: 415 pg/mL (ref 232–1245)

## 2024-01-06 NOTE — Progress Notes (Signed)
 Patient notified

## 2024-01-28 ENCOUNTER — Other Ambulatory Visit: Payer: Self-pay | Admitting: Internal Medicine

## 2024-01-28 MED ORDER — ONETOUCH ULTRA VI STRP: 1.0000 | ORAL_STRIP | Freq: Two times a day (BID) | 11 refills | Status: DC

## 2024-02-05 ENCOUNTER — Other Ambulatory Visit: Payer: Self-pay | Admitting: Internal Medicine

## 2024-02-10 ENCOUNTER — Telehealth: Payer: Self-pay | Admitting: Internal Medicine

## 2024-02-10 ENCOUNTER — Other Ambulatory Visit: Payer: Self-pay | Admitting: Internal Medicine

## 2024-02-10 DIAGNOSIS — E119 Type 2 diabetes mellitus without complications: Secondary | ICD-10-CM

## 2024-02-10 MED ORDER — NOVOLIN 70/30 FLEXPEN (70-30) 100 UNIT/ML ~~LOC~~ SUPN: PEN_INJECTOR | SUBCUTANEOUS | 2 refills | Status: AC

## 2024-02-10 NOTE — Telephone Encounter (Signed)
 Patient has called several times since last week and has still not been able to get her glucometer test strips filled. I contacted the pharmacy and they are stating the One Touch are no longer covered without a PA. They did try to fill for Accu-Chek and they will go through so she will need a new prescription for the Accu-Chek Guide Meter, Lancets & Test Strips. Please send a prescription for those.

## 2024-02-11 ENCOUNTER — Other Ambulatory Visit: Payer: Self-pay

## 2024-02-11 DIAGNOSIS — E119 Type 2 diabetes mellitus without complications: Secondary | ICD-10-CM

## 2024-02-11 MED ORDER — ACCU-CHEK GUIDE TEST VI STRP: 1.0000 | ORAL_STRIP | Freq: Two times a day (BID) | 12 refills | Status: AC

## 2024-02-11 MED ORDER — ACCU-CHEK SOFTCLIX LANCETS MISC: 12 refills | Status: DC

## 2024-02-11 MED ORDER — ACCU-CHEK GUIDE TEST VI STRP: ORAL_STRIP | 12 refills | Status: DC

## 2024-02-11 MED ORDER — ACCU-CHEK GUIDE W/DEVICE KIT: 1.0000 | PACK | Freq: Every day | 0 refills | Status: AC | PRN

## 2024-02-11 MED ORDER — ACCU-CHEK SOFTCLIX LANCETS MISC: 1.0000 | Freq: Two times a day (BID) | 12 refills | Status: AC

## 2024-04-02 ENCOUNTER — Ambulatory Visit: Admitting: Internal Medicine
